# Patient Record
Sex: Male | Born: 1960 | Race: White | Hispanic: No | Marital: Married | Smoking: Former smoker
Health system: Southern US, Community
[De-identification: ages and names within clinical notes are randomized; demographics above are authoritative.]

## PROBLEM LIST (undated history)

## (undated) DIAGNOSIS — N529 Male erectile dysfunction, unspecified: Secondary | ICD-10-CM

## (undated) DIAGNOSIS — K219 Gastro-esophageal reflux disease without esophagitis: Secondary | ICD-10-CM

## (undated) DIAGNOSIS — G2581 Restless legs syndrome: Secondary | ICD-10-CM

## (undated) DIAGNOSIS — E785 Hyperlipidemia, unspecified: Secondary | ICD-10-CM

## (undated) DIAGNOSIS — I739 Peripheral vascular disease, unspecified: Secondary | ICD-10-CM

## (undated) DIAGNOSIS — C801 Malignant (primary) neoplasm, unspecified: Secondary | ICD-10-CM

## (undated) DIAGNOSIS — G4730 Sleep apnea, unspecified: Secondary | ICD-10-CM

## (undated) DIAGNOSIS — I6529 Occlusion and stenosis of unspecified carotid artery: Secondary | ICD-10-CM

## (undated) DIAGNOSIS — I10 Essential (primary) hypertension: Secondary | ICD-10-CM

## (undated) DIAGNOSIS — N189 Chronic kidney disease, unspecified: Secondary | ICD-10-CM

## (undated) DIAGNOSIS — G4700 Insomnia, unspecified: Secondary | ICD-10-CM

## (undated) DIAGNOSIS — M4712 Other spondylosis with myelopathy, cervical region: Secondary | ICD-10-CM

## (undated) HISTORY — DX: Essential (primary) hypertension: I10

## (undated) HISTORY — PX: APPENDECTOMY: SHX54

## (undated) HISTORY — DX: Hyperlipidemia, unspecified: E78.5

## (undated) HISTORY — DX: Insomnia, unspecified: G47.00

## (undated) HISTORY — DX: Other spondylosis with myelopathy, cervical region: M47.12

## (undated) HISTORY — DX: Occlusion and stenosis of unspecified carotid artery: I65.29

## (undated) HISTORY — DX: Restless legs syndrome: G25.81

## (undated) HISTORY — DX: Male erectile dysfunction, unspecified: N52.9

## (undated) HISTORY — DX: Peripheral vascular disease, unspecified: I73.9

## (undated) HISTORY — DX: Malignant (primary) neoplasm, unspecified: C80.1

---

## 1998-09-12 HISTORY — PX: SPINE SURGERY: SHX786

## 1998-09-12 HISTORY — PX: OTHER SURGICAL HISTORY: SHX169

## 1998-10-14 ENCOUNTER — Encounter: Payer: Self-pay | Admitting: Neurosurgery

## 1998-10-16 ENCOUNTER — Encounter: Payer: Self-pay | Admitting: Neurosurgery

## 1998-10-16 ENCOUNTER — Inpatient Hospital Stay (HOSPITAL_COMMUNITY): Admission: RE | Admit: 1998-10-16 | Discharge: 1998-10-16 | Payer: Self-pay | Admitting: Neurosurgery

## 1999-12-07 ENCOUNTER — Encounter: Payer: Self-pay | Admitting: Neurosurgery

## 1999-12-07 ENCOUNTER — Encounter: Admission: RE | Admit: 1999-12-07 | Discharge: 1999-12-07 | Payer: Self-pay | Admitting: Neurosurgery

## 2011-11-07 DIAGNOSIS — H5210 Myopia, unspecified eye: Secondary | ICD-10-CM | POA: Diagnosis not present

## 2011-11-07 DIAGNOSIS — H52229 Regular astigmatism, unspecified eye: Secondary | ICD-10-CM | POA: Diagnosis not present

## 2011-11-07 DIAGNOSIS — H524 Presbyopia: Secondary | ICD-10-CM | POA: Diagnosis not present

## 2011-12-23 DIAGNOSIS — H2510 Age-related nuclear cataract, unspecified eye: Secondary | ICD-10-CM | POA: Diagnosis not present

## 2012-02-06 DIAGNOSIS — H269 Unspecified cataract: Secondary | ICD-10-CM | POA: Diagnosis not present

## 2012-02-06 DIAGNOSIS — H2510 Age-related nuclear cataract, unspecified eye: Secondary | ICD-10-CM | POA: Diagnosis not present

## 2012-02-07 DIAGNOSIS — I10 Essential (primary) hypertension: Secondary | ICD-10-CM | POA: Diagnosis not present

## 2012-02-07 DIAGNOSIS — E119 Type 2 diabetes mellitus without complications: Secondary | ICD-10-CM | POA: Diagnosis not present

## 2012-02-07 DIAGNOSIS — E782 Mixed hyperlipidemia: Secondary | ICD-10-CM | POA: Diagnosis not present

## 2012-02-27 DIAGNOSIS — F17200 Nicotine dependence, unspecified, uncomplicated: Secondary | ICD-10-CM | POA: Diagnosis not present

## 2012-02-27 DIAGNOSIS — M4712 Other spondylosis with myelopathy, cervical region: Secondary | ICD-10-CM | POA: Diagnosis not present

## 2012-02-27 DIAGNOSIS — G4700 Insomnia, unspecified: Secondary | ICD-10-CM | POA: Diagnosis not present

## 2012-02-27 DIAGNOSIS — N529 Male erectile dysfunction, unspecified: Secondary | ICD-10-CM | POA: Diagnosis not present

## 2012-02-27 DIAGNOSIS — G2581 Restless legs syndrome: Secondary | ICD-10-CM | POA: Diagnosis not present

## 2012-02-27 DIAGNOSIS — I10 Essential (primary) hypertension: Secondary | ICD-10-CM | POA: Diagnosis not present

## 2012-02-27 DIAGNOSIS — E119 Type 2 diabetes mellitus without complications: Secondary | ICD-10-CM | POA: Diagnosis not present

## 2012-02-27 DIAGNOSIS — E782 Mixed hyperlipidemia: Secondary | ICD-10-CM | POA: Diagnosis not present

## 2012-03-02 DIAGNOSIS — I70219 Atherosclerosis of native arteries of extremities with intermittent claudication, unspecified extremity: Secondary | ICD-10-CM | POA: Diagnosis not present

## 2012-03-02 DIAGNOSIS — M79609 Pain in unspecified limb: Secondary | ICD-10-CM | POA: Diagnosis not present

## 2012-03-12 ENCOUNTER — Other Ambulatory Visit: Payer: Self-pay

## 2012-03-12 DIAGNOSIS — I739 Peripheral vascular disease, unspecified: Secondary | ICD-10-CM

## 2012-04-12 ENCOUNTER — Encounter: Payer: Self-pay | Admitting: Surgery

## 2012-04-20 ENCOUNTER — Encounter: Payer: Self-pay | Admitting: Surgery

## 2012-04-23 ENCOUNTER — Encounter: Payer: Self-pay | Admitting: Surgery

## 2012-04-23 ENCOUNTER — Ambulatory Visit (INDEPENDENT_AMBULATORY_CARE_PROVIDER_SITE_OTHER): Payer: Medicare Other | Admitting: Surgery

## 2012-04-23 ENCOUNTER — Encounter (INDEPENDENT_AMBULATORY_CARE_PROVIDER_SITE_OTHER): Payer: Medicare Other | Admitting: *Deleted

## 2012-04-23 VITALS — BP 177/85 | HR 81 | Resp 16 | Ht 73.0 in | Wt 178.0 lb

## 2012-04-23 DIAGNOSIS — I70219 Atherosclerosis of native arteries of extremities with intermittent claudication, unspecified extremity: Secondary | ICD-10-CM | POA: Diagnosis not present

## 2012-04-23 DIAGNOSIS — I739 Peripheral vascular disease, unspecified: Secondary | ICD-10-CM | POA: Diagnosis not present

## 2012-04-23 MED ORDER — CILOSTAZOL 100 MG PO TABS: 100.0000 mg | ORAL_TABLET | Freq: Two times a day (BID) | ORAL | Status: DC

## 2012-04-23 NOTE — Progress Notes (Signed)
[facility] [place]   Patient name: Raymond [name] MRN: [id number] DOB: Jul 23, 1961 Sex: male   Referred by: Dr. Pleas Koch  Reason for referral:  Chief Complaint  Patient presents with  . PVD    ref by Dr. Judd Lien    HISTORY OF PRESENT ILLNESS: The patient comes in today for evaluation of bilateral leg pain. He states that he has been having problems for over a year but this has been getting worse. He states that with working in his garden he has his most difficulty. He feels that he can walk on flat surfaces but whenever he tries to go up a hill he suffers from quick onset cramping more prominent in his right leg. However, his left leg has becoming increasingly more severe. He denies ulceration. He denies rest pain.  The patient does have a history of hyperlipidemia which is being managed with diet. He has also had the diagnosis of diabetes but is not yet requiring medication. He is medically managed for his hypertension. His systolic blood pressures have been in the 130s. The patient does have a history of smoking and continues to smoke.  Past Medical History  Diagnosis Date  . Hypertension   . Hyperlipidemia   . Insomnia   . Diabetes mellitus     Type II  . Restless leg syndrome   . Impotence of organic origin   . Cervical spondylosis with myelopathy     Arthritis  . Peripheral arterial disease   . Cancer     melanoma    Past Surgical History  Procedure Date  . Spine surgery 2000    Cervical Myelopathy- S/P fusion  . Spinal fusion 2000    History   Social History  . Marital Status: Married    Spouse Name: N/A    Number of Children: N/A  . Years of Education: N/A   Occupational History  . Not on file.   Social History Main Topics  . Smoking status: Current Everyday Smoker -- 1.0 packs/day    Types: Cigarettes  . Smokeless tobacco: Never Used  . Alcohol Use: 3.6 oz/week    6 Cans of beer per week  . Drug Use: No  . Sexually  Active: Not on file   Other Topics Concern  . Not on file   Social History Narrative  . No narrative on file    Family History  Problem Relation Age of Onset  . Cancer Mother   . Hypertension Father   . Hypertension Sister     Allergies as of 04/23/2012  . (No Known Allergies)    Current Outpatient Prescriptions on File Prior to Visit  Medication Sig Dispense Refill  . acetaminophen (TYLENOL) 100 MG/ML solution Take 10 mg/kg by mouth every 4 (four) hours as needed.      Marland Kitchen amLODipine (NORVASC) 10 MG tablet Take 10 mg by mouth daily.      Marland Kitchen aspirin 81 MG tablet Take 81 mg by mouth daily.      Marland Kitchen lisinopril (PRINIVIL,ZESTRIL) 40 MG tablet Take 40 mg by mouth 2 (two) times daily.       . traZODone (DESYREL) 100 MG tablet Take 100 mg by mouth at bedtime.         REVIEW OF SYSTEMS: Cardiovascular: No chest pain, chest pressure, palpitations, orthopnea, or dyspnea on exertion. Positive for bilateral claudication  No history of DVT or phlebitis. Pulmonary: No productive cough, asthma or wheezing. Neurologic: No weakness, paresthesias, aphasia, or  amaurosis. No dizziness. Hematologic: No bleeding problems or clotting disorders. Musculoskeletal: No joint pain or joint swelling. Gastrointestinal: No blood in stool or hematemesis Genitourinary: No dysuria or hematuria. Psychiatric:: No history of major depression. Integumentary: No rashes or ulcers. Constitutional: No fever or chills.  PHYSICAL EXAMINATION: General: The patient appears their stated age.  Vital signs are BP 177/85  Pulse 81  Resp 16  Ht 6\' 1"  (1.854 m)  Wt 178 lb (80.74 kg)  BMI 23.48 kg/m2  SpO2 99% HEENT:  No gross abnormalities Pulmonary: Respirations are non-labored Abdomen: Soft and non-tender  Musculoskeletal: There are no major deformities.   Neurologic: No focal weakness or paresthesias are detected, Skin: There are no ulcer or rashes noted. Psychiatric: The patient has normal  affect. Cardiovascular: There is a regular rate and rhythm without significant murmur appreciated. Pedal pulses are not palpable. No carotid bruits.  Diagnostic Studies: Duplex ultrasound ordered and reviewed today shows bilateral SFA occlusion. Outside ABIs are 0.34 on the right and 0.5 on the left.  Outside Studies/Documentation Historical records were reviewed.  They showed peripheral vascular disease, right greater than left  Medication Changes: Cilostazol 100 mg twice a day was added  Assessment:  Bilateral claudication, right greater than left Plan: Today I discussed the natural history of peripheral vascular disease. We discussed all treatment options including percutaneous, open surgical, and medical. Because of the patient's age I would like to reserve interventions for his symptoms become more severe. I have outlined an exercise program for him today. I have also stressed the importance of smoking cessation. I would also like him to be on a statin if his cholesterol profile warrents this. I have given him a prescription for cilostazol to see if this will improve his walking distance. He is scheduled to come back to see me in 3 months. At that time we will make the determination as to whether or not we need to proceed with additional interventions.     Eldridge Abrahams, M.D. [facility] [place] Office: [phone] Pager:  [phone]

## 2012-05-07 ENCOUNTER — Telehealth: Payer: Self-pay | Admitting: *Deleted

## 2012-05-07 NOTE — Telephone Encounter (Signed)
Patient called in re: slight dizziness when taking the BID Pletal that he started last week. I called him back and left a message that he could try and split the dosage; Take 1 in the morning and 1 in the evening, Take 30 min prior to eating or 2 hours after eating as per drug dosing guidelines. I told him to call me back if he had any more problems with this.

## 2012-05-21 DIAGNOSIS — E119 Type 2 diabetes mellitus without complications: Secondary | ICD-10-CM | POA: Diagnosis not present

## 2012-05-21 DIAGNOSIS — F17200 Nicotine dependence, unspecified, uncomplicated: Secondary | ICD-10-CM | POA: Diagnosis not present

## 2012-05-21 DIAGNOSIS — I10 Essential (primary) hypertension: Secondary | ICD-10-CM | POA: Diagnosis not present

## 2012-05-21 DIAGNOSIS — E782 Mixed hyperlipidemia: Secondary | ICD-10-CM | POA: Diagnosis not present

## 2012-05-28 DIAGNOSIS — E782 Mixed hyperlipidemia: Secondary | ICD-10-CM | POA: Diagnosis not present

## 2012-05-28 DIAGNOSIS — I10 Essential (primary) hypertension: Secondary | ICD-10-CM | POA: Diagnosis not present

## 2012-05-28 DIAGNOSIS — F17200 Nicotine dependence, unspecified, uncomplicated: Secondary | ICD-10-CM | POA: Diagnosis not present

## 2012-05-28 DIAGNOSIS — G4700 Insomnia, unspecified: Secondary | ICD-10-CM | POA: Diagnosis not present

## 2012-05-28 DIAGNOSIS — E119 Type 2 diabetes mellitus without complications: Secondary | ICD-10-CM | POA: Diagnosis not present

## 2012-05-28 DIAGNOSIS — I739 Peripheral vascular disease, unspecified: Secondary | ICD-10-CM | POA: Diagnosis not present

## 2012-07-20 ENCOUNTER — Encounter: Payer: Self-pay | Admitting: Surgery

## 2012-07-23 ENCOUNTER — Encounter: Payer: Self-pay | Admitting: Surgery

## 2012-07-23 ENCOUNTER — Ambulatory Visit (INDEPENDENT_AMBULATORY_CARE_PROVIDER_SITE_OTHER): Payer: Medicare Other | Admitting: Surgery

## 2012-07-23 VITALS — BP 186/78 | HR 90 | Ht 73.0 in | Wt 179.5 lb

## 2012-07-23 DIAGNOSIS — I70219 Atherosclerosis of native arteries of extremities with intermittent claudication, unspecified extremity: Secondary | ICD-10-CM

## 2012-07-23 NOTE — Progress Notes (Signed)
[facility]    Patient name: Raymond [name] MRN: [id number] DOB: 1961/01/25 Sex: male     Chief Complaint  Patient presents with  . PVD    3 month f/u pt states that he is able to walk further before quitting the cilostazol 3 weeks ago due to it making him dizzy    HISTORY OF PRESENT ILLNESS: The patient is back today for evaluation of bilateral claudication. The patient by ultrasound has bilateral superficial femoral artery occlusion. After our first visit we elected to manage this problem medically. He was given cilostazol 100 mg twice daily. He states that this has caused a significant improvement in his symptoms. However recently he began experiencing dizziness at night and attributed this to the medicine and therefore stopped that. He states that he was taking 2 tablets at night instead of taking one the morning and one at night. His dizziness also coincided with taking his blood pressure medicine. After the medicine he is able to work in his garden as well as walk around the house, something he was not able to do prior to starting cilostazol.  Past Medical History  Diagnosis Date  . Hypertension   . Hyperlipidemia   . Insomnia   . Diabetes mellitus     Type II  . Restless leg syndrome   . Impotence of organic origin   . Cervical spondylosis with myelopathy     Arthritis  . Peripheral arterial disease   . Cancer     melanoma    Past Surgical History  Procedure Date  . Spine surgery 2000    Cervical Myelopathy- S/P fusion  . Spinal fusion 2000    History   Social History  . Marital Status: Married    Spouse Name: N/A    Number of Children: N/A  . Years of Education: N/A   Occupational History  . Not on file.   Social History Main Topics  . Smoking status: Current Every Day Smoker -- 1.0 packs/day    Types: Cigarettes  . Smokeless tobacco: Never Used  . Alcohol Use: 3.6 oz/week    6 Cans of beer per week  . Drug Use: No  .  Sexually Active: Not on file   Other Topics Concern  . Not on file   Social History Narrative  . No narrative on file    Family History  Problem Relation Age of Onset  . Cancer Mother   . Hypertension Father   . Hypertension Sister     Allergies as of 07/23/2012  . (No Known Allergies)    Current Outpatient Prescriptions on File Prior to Visit  Medication Sig Dispense Refill  . acetaminophen (TYLENOL) 100 MG/ML solution Take 10 mg/kg by mouth every 4 (four) hours as needed.      Marland Kitchen amLODipine (NORVASC) 10 MG tablet Take 10 mg by mouth daily.      Marland Kitchen aspirin 81 MG tablet Take 81 mg by mouth daily.      Marland Kitchen atorvastatin (LIPITOR) 10 MG tablet Take 10 mg by mouth daily.      Marland Kitchen lisinopril (PRINIVIL,ZESTRIL) 40 MG tablet Take 40 mg by mouth 2 (two) times daily.       . traZODone (DESYREL) 100 MG tablet Take 100 mg by mouth at bedtime.      . cilostazol (PLETAL) 100 MG tablet Take 1 tablet (100 mg total) by mouth 2 (two) times daily before a meal.  60 tablet  11  REVIEW OF SYSTEMS: We see history of present illness, otherwise no significant changes since last visit.  PHYSICAL EXAMINATION:   Vital signs are BP 186/78  Pulse 90  Ht 6\' 1"  (1.854 m)  Wt 179 lb 8 oz (81.421 kg)  BMI 23.68 kg/m2  SpO2 100% General: The patient appears their stated age. HEENT:  No gross abnormalities Pulmonary:  Non labored breathing Musculoskeletal: There are no major deformities. Neurologic: No focal weakness or paresthesias are detected, Skin: There are no ulcer or rashes noted. Psychiatric: The patient has normal affect. Cardiovascular: There is a regular rate and rhythm without significant murmur appreciated. Pedal pulses are not palpable. No carotid bruits.   Diagnostic Studies None  Assessment: Peripheral vascular disease with bilateral claudication Plan: The patient did have a significant improvement in his symptoms after starting cilostazol. Unfortunately he developed some  dizziness at night after taking the medicine. Upon questioning him, I discovered that he was taking 2 tablets at night instead of one in the morning and one at night. I suspect this may have something to do with his dizziness. I have told him to take 100 mg in the morning and 100 mg at night to see if this helps with his dizziness. In addition, it is also possible that his dizziness is do to his blood pressure medication. If he continues to have dizziness only at night it is likely related to his blood pressure medicine. If it is throughout the day, outer recommend cutting back the cilostazol to 50 mg twice daily. If he still has a problem, I would discontinue it and we can evaluate his symptoms at that time. The patient has been given an appointment with me in 6 months. He will call sooner should he develop problems.  Eldridge Abrahams, M.D. [facility] [place] Office: [phone] Pager:  [phone]

## 2012-12-03 DIAGNOSIS — E782 Mixed hyperlipidemia: Secondary | ICD-10-CM | POA: Diagnosis not present

## 2012-12-03 DIAGNOSIS — I10 Essential (primary) hypertension: Secondary | ICD-10-CM | POA: Diagnosis not present

## 2012-12-10 DIAGNOSIS — E782 Mixed hyperlipidemia: Secondary | ICD-10-CM | POA: Diagnosis not present

## 2012-12-10 DIAGNOSIS — G4700 Insomnia, unspecified: Secondary | ICD-10-CM | POA: Diagnosis not present

## 2012-12-10 DIAGNOSIS — E119 Type 2 diabetes mellitus without complications: Secondary | ICD-10-CM | POA: Diagnosis not present

## 2012-12-10 DIAGNOSIS — I739 Peripheral vascular disease, unspecified: Secondary | ICD-10-CM | POA: Diagnosis not present

## 2012-12-10 DIAGNOSIS — I10 Essential (primary) hypertension: Secondary | ICD-10-CM | POA: Diagnosis not present

## 2012-12-10 DIAGNOSIS — F17200 Nicotine dependence, unspecified, uncomplicated: Secondary | ICD-10-CM | POA: Diagnosis not present

## 2012-12-10 DIAGNOSIS — Z0000 Encounter for general adult medical examination without abnormal findings: Secondary | ICD-10-CM | POA: Diagnosis not present

## 2013-01-18 ENCOUNTER — Encounter: Payer: Self-pay | Admitting: Surgery

## 2013-01-21 ENCOUNTER — Encounter: Payer: Self-pay | Admitting: Surgery

## 2013-01-21 ENCOUNTER — Ambulatory Visit (INDEPENDENT_AMBULATORY_CARE_PROVIDER_SITE_OTHER): Payer: Medicare Other | Admitting: Surgery

## 2013-01-21 VITALS — BP 158/80 | HR 85 | Ht 73.0 in | Wt 182.8 lb

## 2013-01-21 DIAGNOSIS — I70219 Atherosclerosis of native arteries of extremities with intermittent claudication, unspecified extremity: Secondary | ICD-10-CM

## 2013-01-21 NOTE — Progress Notes (Signed)
[facility] [place]   Patient name: Raymond [name] MRN: [id number] DOB: [birth year] Sex: male     Chief Complaint  Patient presents with  . Re-evaluation    6 month f/u pt states that the suspected pletal sx of dizziness has subsided since he stopped drinking coffee 5-6 wk ago also c/o heaviness in his legs at times    HISTORY OF PRESENT ILLNESS: The patient is back today for followup of his bilateral claudication. He had a good response to cilostazol, however was not taking the medication as directed, therefore he was having some side effects of dizziness. Once we got him on the appropriate dosing, he has done very well. He does note that he has trouble when he goes up hill or is pushing heavy objects, otherwise he is able to tolerate his symptoms. He denies nonhealing wounds. He continues to smoke but is cut back to a half a pack a day. He continues to be medically managed for his hypercholesterolemia with a statin. His blood pressure is managed with a ACE inhibitor.  Past Medical History  Diagnosis Date  . Hypertension   . Hyperlipidemia   . Insomnia   . Diabetes mellitus     Type II  . Restless leg syndrome   . Impotence of organic origin   . Cervical spondylosis with myelopathy     Arthritis  . Peripheral arterial disease   . Cancer     melanoma    Past Surgical History  Procedure Laterality Date  . Spine surgery  2000    Cervical Myelopathy- S/P fusion  . Spinal fusion  2000    History   Social History  . Marital Status: Married    Spouse Name: N/A    Number of Children: N/A  . Years of Education: N/A   Occupational History  . Not on file.   Social History Main Topics  . Smoking status: Current Every Day Smoker -- 0.50 packs/day    Types: Cigarettes  . Smokeless tobacco: Never Used  . Alcohol Use: 3.6 oz/week    6 Cans of beer per week  . Drug Use: No  . Sexually Active: Not on file   Other Topics Concern  . Not on file   Social  History Narrative  . No narrative on file    Family History  Problem Relation Age of Onset  . Cancer Mother   . Hypertension Father   . Hypertension Sister     Allergies as of 01/21/2013  . (No Known Allergies)    Current Outpatient Prescriptions on File Prior to Visit  Medication Sig Dispense Refill  . acetaminophen (TYLENOL) 100 MG/ML solution Take 10 mg/kg by mouth every 4 (four) hours as needed.      Marland Kitchen amLODipine (NORVASC) 10 MG tablet Take 10 mg by mouth daily.      Marland Kitchen aspirin 81 MG tablet Take 81 mg by mouth daily.      Marland Kitchen atorvastatin (LIPITOR) 10 MG tablet Take 10 mg by mouth daily.      . cilostazol (PLETAL) 100 MG tablet Take 1 tablet (100 mg total) by mouth 2 (two) times daily before a meal.  60 tablet  11  . lisinopril (PRINIVIL,ZESTRIL) 40 MG tablet Take 40 mg by mouth 2 (two) times daily.       . traZODone (DESYREL) 100 MG tablet Take 100 mg by mouth at bedtime.       No current facility-administered medications on file prior  to visit.     REVIEW OF SYSTEMS: Please see history of present illness, otherwise all systems negative  PHYSICAL EXAMINATION:   Vital signs are BP 158/80  Pulse 85  Ht 6\' 1"  (1.854 m)  Wt 182 lb 12.8 oz (82.918 kg)  BMI 24.12 kg/m2  SpO2 100% General: The patient appears their stated age. HEENT:  No gross abnormalities Pulmonary:  Non labored breathing Abdomen: Soft and non-tender. No pulsatile mass Musculoskeletal: There are no major deformities. Neurologic: No focal weakness or paresthesias are detected, Skin: There are no ulcer or rashes noted. Psychiatric: The patient has normal affect. Cardiovascular: There is a regular rate and rhythm without significant murmur appreciated. No carotid bruits. Pedal pulses are not palpable.   Diagnostic Studies None  Assessment: Bilateral claudication, right greater than left Plan: The patient is doing well with cilostazol. I discussed the need for smoking cessation. I also discussed the  importance of daily monitoring of his feet for nonhealing wounds. He is scheduled to come back in one year for followup with ABIs  V. Leia Alf, M.D. [facility] [place] Office: [phone] Pager:  [phone]

## 2013-01-21 NOTE — Addendum Note (Signed)
Addended by: Mena Goes on: 01/21/2013 12:33 PM   Modules accepted: Orders

## 2013-06-04 DIAGNOSIS — I10 Essential (primary) hypertension: Secondary | ICD-10-CM | POA: Diagnosis not present

## 2013-06-04 DIAGNOSIS — E782 Mixed hyperlipidemia: Secondary | ICD-10-CM | POA: Diagnosis not present

## 2013-06-04 DIAGNOSIS — E119 Type 2 diabetes mellitus without complications: Secondary | ICD-10-CM | POA: Diagnosis not present

## 2013-06-04 DIAGNOSIS — F17200 Nicotine dependence, unspecified, uncomplicated: Secondary | ICD-10-CM | POA: Diagnosis not present

## 2013-06-11 DIAGNOSIS — G4700 Insomnia, unspecified: Secondary | ICD-10-CM | POA: Diagnosis not present

## 2013-06-11 DIAGNOSIS — I739 Peripheral vascular disease, unspecified: Secondary | ICD-10-CM | POA: Diagnosis not present

## 2013-06-11 DIAGNOSIS — Z23 Encounter for immunization: Secondary | ICD-10-CM | POA: Diagnosis not present

## 2013-06-11 DIAGNOSIS — F17200 Nicotine dependence, unspecified, uncomplicated: Secondary | ICD-10-CM | POA: Diagnosis not present

## 2013-06-11 DIAGNOSIS — E782 Mixed hyperlipidemia: Secondary | ICD-10-CM | POA: Diagnosis not present

## 2013-06-11 DIAGNOSIS — I10 Essential (primary) hypertension: Secondary | ICD-10-CM | POA: Diagnosis not present

## 2013-09-10 ENCOUNTER — Other Ambulatory Visit: Payer: Self-pay | Admitting: *Deleted

## 2013-09-10 DIAGNOSIS — I70219 Atherosclerosis of native arteries of extremities with intermittent claudication, unspecified extremity: Secondary | ICD-10-CM

## 2013-09-10 MED ORDER — CILOSTAZOL 100 MG PO TABS: 100.0000 mg | ORAL_TABLET | Freq: Two times a day (BID) | ORAL | Status: DC

## 2013-12-04 DIAGNOSIS — Z0000 Encounter for general adult medical examination without abnormal findings: Secondary | ICD-10-CM | POA: Diagnosis not present

## 2013-12-04 DIAGNOSIS — E782 Mixed hyperlipidemia: Secondary | ICD-10-CM | POA: Diagnosis not present

## 2014-01-27 ENCOUNTER — Encounter (HOSPITAL_COMMUNITY): Payer: Medicare Other

## 2014-01-27 ENCOUNTER — Ambulatory Visit: Payer: Medicare Other | Admitting: Family

## 2014-09-24 ENCOUNTER — Other Ambulatory Visit: Payer: Self-pay | Admitting: *Deleted

## 2014-09-24 DIAGNOSIS — I70219 Atherosclerosis of native arteries of extremities with intermittent claudication, unspecified extremity: Secondary | ICD-10-CM

## 2014-09-24 MED ORDER — CILOSTAZOL 100 MG PO TABS: 100.0000 mg | ORAL_TABLET | Freq: Two times a day (BID) | ORAL | Status: DC

## 2014-10-07 DIAGNOSIS — H538 Other visual disturbances: Secondary | ICD-10-CM | POA: Diagnosis not present

## 2014-10-07 DIAGNOSIS — H5211 Myopia, right eye: Secondary | ICD-10-CM | POA: Diagnosis not present

## 2014-10-16 DIAGNOSIS — H2511 Age-related nuclear cataract, right eye: Secondary | ICD-10-CM | POA: Diagnosis not present

## 2014-10-16 DIAGNOSIS — H25011 Cortical age-related cataract, right eye: Secondary | ICD-10-CM | POA: Diagnosis not present

## 2014-10-16 DIAGNOSIS — H538 Other visual disturbances: Secondary | ICD-10-CM | POA: Diagnosis not present

## 2014-10-20 DIAGNOSIS — I739 Peripheral vascular disease, unspecified: Secondary | ICD-10-CM | POA: Diagnosis not present

## 2014-10-20 DIAGNOSIS — J449 Chronic obstructive pulmonary disease, unspecified: Secondary | ICD-10-CM | POA: Diagnosis not present

## 2014-10-20 DIAGNOSIS — H269 Unspecified cataract: Secondary | ICD-10-CM | POA: Diagnosis not present

## 2014-10-20 DIAGNOSIS — H5200 Hypermetropia, unspecified eye: Secondary | ICD-10-CM | POA: Diagnosis not present

## 2014-10-20 DIAGNOSIS — Z961 Presence of intraocular lens: Secondary | ICD-10-CM | POA: Diagnosis not present

## 2014-10-20 DIAGNOSIS — Z79899 Other long term (current) drug therapy: Secondary | ICD-10-CM | POA: Diagnosis not present

## 2014-10-20 DIAGNOSIS — H2511 Age-related nuclear cataract, right eye: Secondary | ICD-10-CM | POA: Diagnosis not present

## 2014-10-20 DIAGNOSIS — H538 Other visual disturbances: Secondary | ICD-10-CM | POA: Diagnosis not present

## 2014-10-20 DIAGNOSIS — H524 Presbyopia: Secondary | ICD-10-CM | POA: Diagnosis not present

## 2014-10-20 DIAGNOSIS — H5210 Myopia, unspecified eye: Secondary | ICD-10-CM | POA: Diagnosis not present

## 2014-10-20 DIAGNOSIS — I10 Essential (primary) hypertension: Secondary | ICD-10-CM | POA: Diagnosis not present

## 2014-10-20 DIAGNOSIS — H52209 Unspecified astigmatism, unspecified eye: Secondary | ICD-10-CM | POA: Diagnosis not present

## 2014-10-20 DIAGNOSIS — Z7982 Long term (current) use of aspirin: Secondary | ICD-10-CM | POA: Diagnosis not present

## 2014-10-20 DIAGNOSIS — G4700 Insomnia, unspecified: Secondary | ICD-10-CM | POA: Diagnosis not present

## 2014-10-20 DIAGNOSIS — H25011 Cortical age-related cataract, right eye: Secondary | ICD-10-CM | POA: Diagnosis not present

## 2015-09-13 HISTORY — PX: EYE SURGERY: SHX253

## 2015-10-02 ENCOUNTER — Other Ambulatory Visit: Payer: Self-pay | Admitting: Surgery

## 2015-10-07 ENCOUNTER — Other Ambulatory Visit: Payer: Self-pay | Admitting: Surgery

## 2016-03-17 DIAGNOSIS — Z8673 Personal history of transient ischemic attack (TIA), and cerebral infarction without residual deficits: Secondary | ICD-10-CM | POA: Diagnosis not present

## 2016-03-17 DIAGNOSIS — M25512 Pain in left shoulder: Secondary | ICD-10-CM | POA: Diagnosis not present

## 2016-03-17 DIAGNOSIS — F17200 Nicotine dependence, unspecified, uncomplicated: Secondary | ICD-10-CM | POA: Diagnosis not present

## 2016-03-17 DIAGNOSIS — R51 Headache: Secondary | ICD-10-CM | POA: Diagnosis not present

## 2016-03-17 DIAGNOSIS — M542 Cervicalgia: Secondary | ICD-10-CM | POA: Diagnosis not present

## 2016-03-17 DIAGNOSIS — S0990XA Unspecified injury of head, initial encounter: Secondary | ICD-10-CM | POA: Diagnosis not present

## 2016-03-17 DIAGNOSIS — S4992XA Unspecified injury of left shoulder and upper arm, initial encounter: Secondary | ICD-10-CM | POA: Diagnosis not present

## 2016-03-17 DIAGNOSIS — Z79899 Other long term (current) drug therapy: Secondary | ICD-10-CM | POA: Diagnosis not present

## 2016-03-17 DIAGNOSIS — I10 Essential (primary) hypertension: Secondary | ICD-10-CM | POA: Diagnosis not present

## 2016-03-17 DIAGNOSIS — S199XXA Unspecified injury of neck, initial encounter: Secondary | ICD-10-CM | POA: Diagnosis not present

## 2016-04-11 ENCOUNTER — Other Ambulatory Visit: Payer: Self-pay | Admitting: Surgery

## 2016-05-16 ENCOUNTER — Other Ambulatory Visit: Payer: Self-pay | Admitting: Surgery

## 2016-06-21 ENCOUNTER — Telehealth: Payer: Self-pay | Admitting: Surgery

## 2016-06-21 NOTE — Telephone Encounter (Signed)
Patient called and says he needs a refill of his Plavix.  Pt aware message will be answered on 06/22/16.  Oswaldo Cueto E., LPN.

## 2016-06-22 ENCOUNTER — Other Ambulatory Visit: Payer: Self-pay | Admitting: Surgery

## 2016-06-22 ENCOUNTER — Other Ambulatory Visit: Payer: Self-pay

## 2016-06-22 DIAGNOSIS — I70219 Atherosclerosis of native arteries of extremities with intermittent claudication, unspecified extremity: Secondary | ICD-10-CM

## 2016-06-22 MED ORDER — CILOSTAZOL 100 MG PO TABS: 100.0000 mg | ORAL_TABLET | Freq: Two times a day (BID) | ORAL | 1 refills | Status: DC

## 2016-08-09 ENCOUNTER — Other Ambulatory Visit: Payer: Self-pay | Admitting: *Deleted

## 2016-08-09 DIAGNOSIS — I70219 Atherosclerosis of native arteries of extremities with intermittent claudication, unspecified extremity: Secondary | ICD-10-CM

## 2016-08-09 MED ORDER — CILOSTAZOL 100 MG PO TABS: 100.0000 mg | ORAL_TABLET | Freq: Two times a day (BID) | ORAL | 5 refills | Status: DC

## 2016-08-10 ENCOUNTER — Ambulatory Visit: Payer: Medicare Other | Admitting: Family

## 2016-08-10 ENCOUNTER — Encounter (HOSPITAL_COMMUNITY): Payer: Medicare Other

## 2016-08-15 ENCOUNTER — Ambulatory Visit: Payer: Medicare Other | Admitting: Family

## 2016-08-15 ENCOUNTER — Encounter (HOSPITAL_COMMUNITY): Payer: Medicare Other

## 2016-10-04 ENCOUNTER — Encounter: Payer: Self-pay | Admitting: Family

## 2016-10-05 ENCOUNTER — Other Ambulatory Visit: Payer: Self-pay

## 2016-10-05 DIAGNOSIS — I739 Peripheral vascular disease, unspecified: Secondary | ICD-10-CM

## 2016-10-10 ENCOUNTER — Ambulatory Visit (INDEPENDENT_AMBULATORY_CARE_PROVIDER_SITE_OTHER): Payer: Medicare Other | Admitting: Family

## 2016-10-10 ENCOUNTER — Encounter: Payer: Self-pay | Admitting: Family

## 2016-10-10 ENCOUNTER — Ambulatory Visit (HOSPITAL_COMMUNITY)
Admission: RE | Admit: 2016-10-10 | Discharge: 2016-10-10 | Disposition: A | Discharge status: Home / Self Care | Payer: Medicare Other | Source: Ambulatory Visit | Attending: Family | Admitting: Family

## 2016-10-10 VITALS — BP 126/72 | HR 100 | Temp 97.2°F | Resp 18 | Ht 73.0 in | Wt 175.0 lb

## 2016-10-10 DIAGNOSIS — R938 Abnormal findings on diagnostic imaging of other specified body structures: Secondary | ICD-10-CM | POA: Insufficient documentation

## 2016-10-10 DIAGNOSIS — I70213 Atherosclerosis of native arteries of extremities with intermittent claudication, bilateral legs: Secondary | ICD-10-CM

## 2016-10-10 DIAGNOSIS — E785 Hyperlipidemia, unspecified: Secondary | ICD-10-CM | POA: Insufficient documentation

## 2016-10-10 DIAGNOSIS — I739 Peripheral vascular disease, unspecified: Secondary | ICD-10-CM | POA: Insufficient documentation

## 2016-10-10 DIAGNOSIS — I10 Essential (primary) hypertension: Secondary | ICD-10-CM | POA: Diagnosis not present

## 2016-10-10 DIAGNOSIS — R0989 Other specified symptoms and signs involving the circulatory and respiratory systems: Secondary | ICD-10-CM | POA: Diagnosis present

## 2016-10-10 DIAGNOSIS — F17200 Nicotine dependence, unspecified, uncomplicated: Secondary | ICD-10-CM | POA: Diagnosis not present

## 2016-10-10 DIAGNOSIS — Z720 Tobacco use: Secondary | ICD-10-CM | POA: Diagnosis not present

## 2016-10-10 NOTE — Patient Instructions (Signed)
Intermittent Claudication Intermittent claudication is pain in your leg that occurs when you walk or exercise and goes away when you rest. The pain can occur in one or both legs. CAUSES Intermittent claudication is caused by the buildup of plaque within the major arteries in the body (atherosclerosis). The plaque, which makes arteries stiff and narrow, prevents enough blood from reaching your leg muscles. The pain occurs when you walk or exercise because your muscles need more blood when you are moving and exercising. RISK FACTORS Risk factors include:  A family history of atherosclerosis.  A personal history of stroke or heart disease.  Older age.  Being inactive or overweight.  Smoking cigarettes.  Having another health condition such as:  Diabetes.  High blood pressure.  High cholesterol. SIGNS AND SYMPTOMS  Your hip or leg may:   Ache.  Cramp.  Feel tight.  Feel weak.  Feel heavy. Over time, you may feel pain in your calf, thigh, or hip. DIAGNOSIS  Your health care provider may diagnose intermittent claudication based on your symptoms and medical history. Your health care provider may also do tests to learn more about your condition. These may include:  Blood tests.  An ultrasound.  Imaging tests such as angiography, magnetic resonance angiography (MRA), and computed tomography angiography (CTA). TREATMENT You may be treated for problems such as:  High blood pressure.  High cholesterol.  Diabetes. Other treatments may include:  Lifestyle changes such as:  Starting an exercise program.  Losing weight.  Quitting smoking.  Medicines to help restore blood flow through your legs.  Blood vessel surgery (angioplasty) to restore blood flow if your intermittent claudication is caused by severe peripheral artery disease. HOME CARE INSTRUCTIONS  Manage any other health conditions you have.  Eat a diet low in saturated fats and calories to maintain a  healthy weight.  Quit smoking, if you smoke.  Take medicines only as directed by your health care provider.  If your health care provider recommended an exercise program for you, follow it as directed. Your exercise program may involve:  Walking three or more times a week.  Walking until you have certain symptoms of intermittent claudication.  Resting until symptoms go away.  Gradually increasing walking time to about 50 minutes a day. SEEK MEDICAL CARE IF: Your condition is not getting better or is getting worse. SEEK IMMEDIATE MEDICAL CARE IF:   You have chest pain.  You have difficulty breathing.  You develop arm weakness.  You have trouble speaking.  Your face begins to droop. MAKE SURE YOU:  Understand these instructions.  Will watch your condition.  Will get help if you are not doing well or get worse. This information is not intended to replace advice given to you by your health care provider. Make sure you discuss any questions you have with your health care provider. Document Released: 07/01/2004 Document Revised: 09/19/2014 Document Reviewed: 12/05/2013 Elsevier Interactive Patient Education  2017 [employer].      Steps to Quit Smoking Smoking tobacco can be bad for your health. It can also affect almost every organ in your body. Smoking puts you and people around you at risk for many serious long-lasting (chronic) diseases. Quitting smoking is hard, but it is one of the best things that you can do for your health. It is never too late to quit. What are the benefits of quitting smoking? When you quit smoking, you lower your risk for getting serious diseases and conditions. They can include:  Lung cancer or lung disease.  Heart disease.  Stroke.  Heart attack.  Not being able to have children (infertility).  Weak bones (osteoporosis) and broken bones (fractures). If you have coughing, wheezing, and shortness of breath, those symptoms may get  better when you quit. You may also get sick less often. If you are pregnant, quitting smoking can help to lower your chances of having a baby of low birth weight. What can I do to help me quit smoking? Talk with your doctor about what can help you quit smoking. Some things you can do (strategies) include:  Quitting smoking totally, instead of slowly cutting back how much you smoke over a period of time.  Going to in-person counseling. You are more likely to quit if you go to many counseling sessions.  Using resources and support systems, such as:  Online chats with a Social worker.  Phone quitlines.  Printed Furniture conservator/restorer.  Support groups or group counseling.  Text messaging programs.  Mobile phone apps or applications.  Taking medicines. Some of these medicines may have nicotine in them. If you are pregnant or breastfeeding, do not take any medicines to quit smoking unless your doctor says it is okay. Talk with your doctor about counseling or other things that can help you. Talk with your doctor about using more than one strategy at the same time, such as taking medicines while you are also going to in-person counseling. This can help make quitting easier. What things can I do to make it easier to quit? Quitting smoking might feel very hard at first, but there is a lot that you can do to make it easier. Take these steps:  Talk to your family and friends. Ask them to support and encourage you.  Call phone quitlines, reach out to support groups, or work with a Social worker.  Ask people who smoke to not smoke around you.  Avoid places that make you want (trigger) to smoke, such as:  Bars.  Parties.  Smoke-break areas at work.  Spend time with people who do not smoke.  Lower the stress in your life. Stress can make you want to smoke. Try these things to help your stress:  Getting regular exercise.  Deep-breathing exercises.  Yoga.  Meditating.  Doing a body scan. To  do this, close your eyes, focus on one area of your body at a time from head to toe, and notice which parts of your body are tense. Try to relax the muscles in those areas.  Download or buy apps on your mobile phone or tablet that can help you stick to your quit plan. There are many free apps, such as QuitGuide from the [employer] Office manager for Disease Control and Prevention). You can find more support from smokefree.gov and other websites. This information is not intended to replace advice given to you by your health care provider. Make sure you discuss any questions you have with your health care provider. Document Released: 06/25/2009 Document Revised: 04/26/2016 Document Reviewed: 01/13/2015 Elsevier Interactive Patient Education  2017 [employer].

## 2016-10-10 NOTE — Progress Notes (Signed)
CC: Follow up Intermittent Claudication  History of Present Illness  Raymond [name] is a 56 y.o. (1961/07/14) male patient of Dr. Trula Slade returns for followup of his bilateral claudication. He had a good response to cilostazol, however was not taking the medication as directed, therefore he was having some side effects of dizziness. Once we got him on the appropriate dosing, he has done very well. He does note that he has trouble when he walks up an incline or is pushing heavy objects, otherwise he is able to tolerate his symptoms. He denies nonhealing wounds. He continues to smoke but is cut back to a half a pack a day. He continues to be medically managed for his hypercholesterolemia with a statin. His blood pressure is managed with a ACE inhibitor.  Dr. Trula Slade last evaluated pt on 01-21-13. At that time pt had bilateral claudication, right greater than left The patient was doing well with cilostazol. Dr. Trula Slade advised to return in one year for followup with ABIs.  Patient has been lost to follow up until today.   He denies any history of stroke or TIA; denies any history of cardiac problems.   Pt Diabetic: pt states he does not think he has DM.  Pt smoker: smoker  (1/2 ppd, started at age 45 yrs)  Pt meds include: Statin :Yes Betablocker: No ASA: Yes Other anticoagulants/antiplatelets: no   Past Medical History:  Diagnosis Date  . Cancer    melanoma  . Cervical spondylosis with myelopathy    Arthritis  . Diabetes mellitus    Type II  . Hyperlipidemia   . Hypertension   . Impotence of organic origin   . Insomnia   . Peripheral arterial disease   . Restless leg syndrome     Social History Social History  Substance Use Topics  . Smoking status: Current Every Day Smoker    Packs/day: 0.50    Types: Cigarettes  . Smokeless tobacco: Never Used  . Alcohol use 3.6 oz/week    6 Cans of beer per week    Family History Family History  Problem Relation Age of  Onset  . Cancer Mother   . Hypertension Father   . Hypertension Sister     Surgical History Past Surgical History:  Procedure Laterality Date  . spinal fusion  2000  . SPINE SURGERY  2000   Cervical Myelopathy- S/P fusion    No Known Allergies  Current Outpatient Prescriptions  Medication Sig Dispense Refill  . acetaminophen (TYLENOL) 100 MG/ML solution Take 10 mg/kg by mouth every 4 (four) hours as needed.    Marland Kitchen amLODipine (NORVASC) 10 MG tablet Take 10 mg by mouth daily.    Marland Kitchen aspirin 81 MG tablet Take 81 mg by mouth daily.    . cilostazol (PLETAL) 100 MG tablet Take 1 tablet (100 mg total) by mouth 2 (two) times daily. 60 tablet 5  . lisinopril (PRINIVIL,ZESTRIL) 40 MG tablet Take 40 mg by mouth 2 (two) times daily.     . traZODone (DESYREL) 100 MG tablet Take 100 mg by mouth at bedtime.    Marland Kitchen atorvastatin (LIPITOR) 10 MG tablet Take 10 mg by mouth daily.     No current facility-administered medications for this visit.     REVIEW OF SYSTEMS: see HPI for pertinent positives and negatives    Physical Examination  Vitals:   10/10/16 1128  BP: 126/72  Pulse: 100  Resp: 18  Temp: 97.2 F (36.2 C)  TempSrc: Oral  SpO2: 99%  Weight: 175 lb (79.4 kg)  Height: 6\' 1"  (1.854 m)   Body mass index is 23.09 kg/m.  General: The patient appears their stated age.   HEENT:  No gross abnormalities Pulmonary: Respirations are non-labored Abdomen: Soft and non-tender, normal pitched bowel sounds. Musculoskeletal: There are no major deformities.   Neurologic: No focal weakness or paresthesias are detected. CN 2-12 are grossly intact.  Skin: There are no ulcers or rashes noted. Psychiatric: The patient has normal affect. Cardiovascular: There is a regular rate and rhythm without significant murmur appreciated.   Vascular: Vessel Right Left  Radial 2+Palpable 2+Palpable  Carotid  without bruit  without bruit  Aorta Not palpable N/A  Femoral Not Palpable 2+Palpable  Popliteal  Not palpable Not palpable  PT Not Palpable Not Palpable  DP Not Palpable Not Palpable   Non-Invasive Vascular Imaging ABI (Date: 10/10/2016)  R: 0.47 (0.34 in 2013), DP: mono, PT: mono, TBI: 0.00  L: 0.51 (0.50 in 2013), DP: mono, PT: mono, TBI: 0.25   Medical Decision Making  Raymond [name] is a 56 y.o. male who presents with: no leg intermittent claudication unless he climbs and incline. There are no signs of ischemia in his feet/legs. Unfortunately he continues to smoke but seems motivated to quit.    Based on the patient's vascular studies and examination, I have offered the patient: ABI's in 1 year.  I advised him to notify us if he develops worsening symptoms in his legs or sores in his feet/legs that are slow to heal.  Graduated walking program discussed and how to achieve.  The patient was counseled re smoking cessation and given several free resources re smoking cessation.   I discussed in depth with the patient the nature of atherosclerosis, and emphasized the importance of maximal medical management including strict control of blood pressure, blood glucose, and lipid levels, antiplatelet agents, obtaining regular exercise, and cessation of smoking.         I discussed with the patient the natural history of intermittent claudication: 75% of patients have stable or improved symptoms in a year and only 2% require amputation. Eventually 20% may require intervention in a year.  The patient is aware that without maximal medical management the underlying atherosclerotic disease process will progress, limiting the benefit of any interventions. The patient is currently on a statin.   The patient is currently on an anti-platelet: ASA 81 mg daily. He is taking Pletal for his claudication which has helped.   Thank you for allowing [place] to participate in this patient's care.  Nehal Shives, Sharmon Leyden, RN, MSN, FNP-C [facility] Office:  [phone]  10/10/2016, 11:51 AM  Clinic MD: Trula Slade

## 2016-10-19 NOTE — Addendum Note (Signed)
Addended by: Lianne Cure A on: 10/19/2016 09:56 AM   Modules accepted: Orders

## 2017-01-04 ENCOUNTER — Other Ambulatory Visit: Payer: Self-pay | Admitting: Surgery

## 2017-01-04 DIAGNOSIS — I70219 Atherosclerosis of native arteries of extremities with intermittent claudication, unspecified extremity: Secondary | ICD-10-CM

## 2017-05-10 DIAGNOSIS — Z6823 Body mass index (BMI) 23.0-23.9, adult: Secondary | ICD-10-CM | POA: Diagnosis not present

## 2017-05-10 DIAGNOSIS — F17210 Nicotine dependence, cigarettes, uncomplicated: Secondary | ICD-10-CM | POA: Diagnosis not present

## 2017-05-10 DIAGNOSIS — E782 Mixed hyperlipidemia: Secondary | ICD-10-CM | POA: Diagnosis not present

## 2017-05-10 DIAGNOSIS — E1165 Type 2 diabetes mellitus with hyperglycemia: Secondary | ICD-10-CM | POA: Diagnosis not present

## 2017-05-10 DIAGNOSIS — M4712 Other spondylosis with myelopathy, cervical region: Secondary | ICD-10-CM | POA: Diagnosis not present

## 2017-05-10 DIAGNOSIS — I10 Essential (primary) hypertension: Secondary | ICD-10-CM | POA: Diagnosis not present

## 2017-05-10 DIAGNOSIS — I739 Peripheral vascular disease, unspecified: Secondary | ICD-10-CM | POA: Diagnosis not present

## 2017-05-10 DIAGNOSIS — G4700 Insomnia, unspecified: Secondary | ICD-10-CM | POA: Diagnosis not present

## 2017-08-04 DIAGNOSIS — E1165 Type 2 diabetes mellitus with hyperglycemia: Secondary | ICD-10-CM | POA: Diagnosis not present

## 2017-08-04 DIAGNOSIS — E782 Mixed hyperlipidemia: Secondary | ICD-10-CM | POA: Diagnosis not present

## 2017-08-04 DIAGNOSIS — I10 Essential (primary) hypertension: Secondary | ICD-10-CM | POA: Diagnosis not present

## 2017-08-04 DIAGNOSIS — F17210 Nicotine dependence, cigarettes, uncomplicated: Secondary | ICD-10-CM | POA: Diagnosis not present

## 2017-08-04 DIAGNOSIS — I739 Peripheral vascular disease, unspecified: Secondary | ICD-10-CM | POA: Diagnosis not present

## 2017-08-07 DIAGNOSIS — M25511 Pain in right shoulder: Secondary | ICD-10-CM | POA: Diagnosis not present

## 2017-08-07 DIAGNOSIS — Z79899 Other long term (current) drug therapy: Secondary | ICD-10-CM | POA: Diagnosis not present

## 2017-08-07 DIAGNOSIS — F17200 Nicotine dependence, unspecified, uncomplicated: Secondary | ICD-10-CM | POA: Diagnosis not present

## 2017-08-07 DIAGNOSIS — I10 Essential (primary) hypertension: Secondary | ICD-10-CM | POA: Diagnosis not present

## 2017-08-07 DIAGNOSIS — Z7982 Long term (current) use of aspirin: Secondary | ICD-10-CM | POA: Diagnosis not present

## 2017-08-07 DIAGNOSIS — S46011A Strain of muscle(s) and tendon(s) of the rotator cuff of right shoulder, initial encounter: Secondary | ICD-10-CM | POA: Diagnosis not present

## 2017-08-07 DIAGNOSIS — X500XXA Overexertion from strenuous movement or load, initial encounter: Secondary | ICD-10-CM | POA: Diagnosis not present

## 2017-08-09 DIAGNOSIS — I10 Essential (primary) hypertension: Secondary | ICD-10-CM | POA: Diagnosis not present

## 2017-08-09 DIAGNOSIS — Z6823 Body mass index (BMI) 23.0-23.9, adult: Secondary | ICD-10-CM | POA: Diagnosis not present

## 2017-08-09 DIAGNOSIS — E871 Hypo-osmolality and hyponatremia: Secondary | ICD-10-CM | POA: Diagnosis not present

## 2017-08-09 DIAGNOSIS — I739 Peripheral vascular disease, unspecified: Secondary | ICD-10-CM | POA: Diagnosis not present

## 2017-08-09 DIAGNOSIS — E782 Mixed hyperlipidemia: Secondary | ICD-10-CM | POA: Diagnosis not present

## 2017-08-09 DIAGNOSIS — S46011A Strain of muscle(s) and tendon(s) of the rotator cuff of right shoulder, initial encounter: Secondary | ICD-10-CM | POA: Diagnosis not present

## 2017-08-09 DIAGNOSIS — E1165 Type 2 diabetes mellitus with hyperglycemia: Secondary | ICD-10-CM | POA: Diagnosis not present

## 2017-08-09 DIAGNOSIS — M4712 Other spondylosis with myelopathy, cervical region: Secondary | ICD-10-CM | POA: Diagnosis not present

## 2017-09-04 ENCOUNTER — Other Ambulatory Visit: Payer: Self-pay

## 2017-09-04 DIAGNOSIS — I70219 Atherosclerosis of native arteries of extremities with intermittent claudication, unspecified extremity: Secondary | ICD-10-CM

## 2017-09-04 MED ORDER — CILOSTAZOL 100 MG PO TABS: ORAL_TABLET | ORAL | 1 refills | Status: DC

## 2017-09-14 DIAGNOSIS — F17210 Nicotine dependence, cigarettes, uncomplicated: Secondary | ICD-10-CM | POA: Diagnosis not present

## 2017-09-14 DIAGNOSIS — R05 Cough: Secondary | ICD-10-CM | POA: Diagnosis not present

## 2017-09-14 DIAGNOSIS — J019 Acute sinusitis, unspecified: Secondary | ICD-10-CM | POA: Diagnosis not present

## 2017-09-14 DIAGNOSIS — Z6823 Body mass index (BMI) 23.0-23.9, adult: Secondary | ICD-10-CM | POA: Diagnosis not present

## 2017-10-02 ENCOUNTER — Other Ambulatory Visit: Payer: Self-pay | Admitting: Surgery

## 2017-10-02 DIAGNOSIS — I70219 Atherosclerosis of native arteries of extremities with intermittent claudication, unspecified extremity: Secondary | ICD-10-CM

## 2017-10-03 DIAGNOSIS — I10 Essential (primary) hypertension: Secondary | ICD-10-CM | POA: Diagnosis not present

## 2017-10-03 DIAGNOSIS — E1165 Type 2 diabetes mellitus with hyperglycemia: Secondary | ICD-10-CM | POA: Diagnosis not present

## 2017-10-03 DIAGNOSIS — I739 Peripheral vascular disease, unspecified: Secondary | ICD-10-CM | POA: Diagnosis not present

## 2017-10-03 DIAGNOSIS — E782 Mixed hyperlipidemia: Secondary | ICD-10-CM | POA: Diagnosis not present

## 2017-10-03 DIAGNOSIS — E871 Hypo-osmolality and hyponatremia: Secondary | ICD-10-CM | POA: Diagnosis not present

## 2017-10-03 DIAGNOSIS — F17210 Nicotine dependence, cigarettes, uncomplicated: Secondary | ICD-10-CM | POA: Diagnosis not present

## 2017-10-04 DIAGNOSIS — Z0001 Encounter for general adult medical examination with abnormal findings: Secondary | ICD-10-CM | POA: Diagnosis not present

## 2017-10-04 DIAGNOSIS — Z23 Encounter for immunization: Secondary | ICD-10-CM | POA: Diagnosis not present

## 2017-10-04 DIAGNOSIS — E1165 Type 2 diabetes mellitus with hyperglycemia: Secondary | ICD-10-CM | POA: Diagnosis not present

## 2017-10-04 DIAGNOSIS — I739 Peripheral vascular disease, unspecified: Secondary | ICD-10-CM | POA: Diagnosis not present

## 2017-10-04 DIAGNOSIS — M4712 Other spondylosis with myelopathy, cervical region: Secondary | ICD-10-CM | POA: Diagnosis not present

## 2017-10-04 DIAGNOSIS — Z6823 Body mass index (BMI) 23.0-23.9, adult: Secondary | ICD-10-CM | POA: Diagnosis not present

## 2017-10-04 DIAGNOSIS — I10 Essential (primary) hypertension: Secondary | ICD-10-CM | POA: Diagnosis not present

## 2017-10-04 DIAGNOSIS — E782 Mixed hyperlipidemia: Secondary | ICD-10-CM | POA: Diagnosis not present

## 2017-10-08 DIAGNOSIS — Z7982 Long term (current) use of aspirin: Secondary | ICD-10-CM | POA: Diagnosis not present

## 2017-10-08 DIAGNOSIS — Z79899 Other long term (current) drug therapy: Secondary | ICD-10-CM | POA: Diagnosis not present

## 2017-10-08 DIAGNOSIS — F17200 Nicotine dependence, unspecified, uncomplicated: Secondary | ICD-10-CM | POA: Diagnosis not present

## 2017-10-08 DIAGNOSIS — R05 Cough: Secondary | ICD-10-CM | POA: Diagnosis not present

## 2017-10-08 DIAGNOSIS — I10 Essential (primary) hypertension: Secondary | ICD-10-CM | POA: Diagnosis not present

## 2017-10-08 DIAGNOSIS — J209 Acute bronchitis, unspecified: Secondary | ICD-10-CM | POA: Diagnosis not present

## 2017-10-08 DIAGNOSIS — Z981 Arthrodesis status: Secondary | ICD-10-CM | POA: Diagnosis not present

## 2017-10-08 DIAGNOSIS — Z720 Tobacco use: Secondary | ICD-10-CM | POA: Diagnosis not present

## 2017-10-16 ENCOUNTER — Ambulatory Visit: Payer: Medicare Other | Admitting: Family

## 2017-10-16 ENCOUNTER — Encounter (HOSPITAL_COMMUNITY): Payer: Medicare Other

## 2017-10-27 ENCOUNTER — Other Ambulatory Visit: Payer: Self-pay

## 2017-10-27 DIAGNOSIS — I70219 Atherosclerosis of native arteries of extremities with intermittent claudication, unspecified extremity: Secondary | ICD-10-CM

## 2017-10-27 MED ORDER — CILOSTAZOL 100 MG PO TABS: 100.0000 mg | ORAL_TABLET | Freq: Two times a day (BID) | ORAL | 0 refills | Status: DC

## 2017-12-22 ENCOUNTER — Other Ambulatory Visit: Payer: Self-pay | Admitting: Surgery

## 2017-12-22 DIAGNOSIS — I70219 Atherosclerosis of native arteries of extremities with intermittent claudication, unspecified extremity: Secondary | ICD-10-CM

## 2018-01-08 ENCOUNTER — Ambulatory Visit (INDEPENDENT_AMBULATORY_CARE_PROVIDER_SITE_OTHER): Payer: Medicare Other | Admitting: Family

## 2018-01-08 ENCOUNTER — Ambulatory Visit (HOSPITAL_COMMUNITY)
Admission: RE | Admit: 2018-01-08 | Discharge: 2018-01-08 | Disposition: A | Discharge status: Home / Self Care | Payer: Medicare Other | Source: Ambulatory Visit | Attending: Family | Admitting: Family

## 2018-01-08 ENCOUNTER — Encounter: Payer: Self-pay | Admitting: Family

## 2018-01-08 VITALS — BP 147/86 | HR 93 | Temp 97.2°F | Resp 16 | Ht 73.0 in | Wt 173.1 lb

## 2018-01-08 DIAGNOSIS — E785 Hyperlipidemia, unspecified: Secondary | ICD-10-CM | POA: Insufficient documentation

## 2018-01-08 DIAGNOSIS — I70213 Atherosclerosis of native arteries of extremities with intermittent claudication, bilateral legs: Secondary | ICD-10-CM | POA: Diagnosis not present

## 2018-01-08 DIAGNOSIS — I779 Disorder of arteries and arterioles, unspecified: Secondary | ICD-10-CM

## 2018-01-08 DIAGNOSIS — F17200 Nicotine dependence, unspecified, uncomplicated: Secondary | ICD-10-CM | POA: Insufficient documentation

## 2018-01-08 MED ORDER — CILOSTAZOL 100 MG PO TABS: 100.0000 mg | ORAL_TABLET | Freq: Two times a day (BID) | ORAL | 2 refills | Status: DC

## 2018-01-08 NOTE — Progress Notes (Signed)
Pt. Is here for 1 year follow-up. Pt. Request if he can get Cilostazol refill for 90days.

## 2018-01-08 NOTE — Progress Notes (Signed)
[facility]   CC: Follow up peripheral artery occlusive disease  History of Present Illness Raymond [name] is a 56 y.o. male whom Dr. Trula Slade has been monitoring for bilateral claudication.   He had a good response to cilostazol, however was not taking the medication as directed, therefore he was having some side effects of dizziness. Once we got him on the appropriate dosing, he has done very well. He does note that he has trouble when he walks up an incline or is pushing heavy objects, otherwise he is able to tolerate his symptoms. He denies nonhealing wounds.  Dr. Trula Slade last evaluated pt on 01-21-13. At that time pt had bilateral claudication,right greater than left The patient was doing well with cilostazol. Dr. Trula Slade advised to return in one year for followup with ABIs.  He denies any history of stroke or TIA; denies any history of cardiac problems.   Pt Diabetic: pt states he does not think he has DM.  Pt smoker: smoker  (decreased to 2 cigarettes/day from 1/2 ppd, started at age 5 yrs)  Pt meds include: Statin :Yes Betablocker: No ASA: Yes Other anticoagulants/antiplatelets: no      Past Medical History:  Diagnosis Date  . Cancer ([place])    melanoma  . Cervical spondylosis with myelopathy    Arthritis  . Diabetes mellitus    Type II  . Hyperlipidemia   . Hypertension   . Impotence of organic origin   . Insomnia   . Peripheral arterial disease ([place])   . Restless leg syndrome     Social History Social History   Tobacco Use  . Smoking status: Current Every Day Smoker    Packs/day: 0.50    Types: Cigarettes  . Smokeless tobacco: Never Used  Substance Use Topics  . Alcohol use: Yes    Alcohol/week: 3.6 oz    Types: 6 Cans of beer per week  . Drug use: No    Family History Family History  Problem Relation Age of Onset  . Cancer Mother   . Hypertension Father   . Hypertension Sister     Past Surgical History:   Procedure Laterality Date  . spinal fusion  2000  . SPINE SURGERY  2000   Cervical Myelopathy- S/P fusion    No Known Allergies  Current Outpatient Medications  Medication Sig Dispense Refill  . acetaminophen (TYLENOL) 100 MG/ML solution Take 10 mg/kg by mouth every 4 (four) hours as needed.    Marland Kitchen amLODipine (NORVASC) 10 MG tablet Take 10 mg by mouth daily.    Marland Kitchen aspirin 81 MG tablet Take 81 mg by mouth daily.    Marland Kitchen atorvastatin (LIPITOR) 10 MG tablet Take 10 mg by mouth daily.    . cilostazol (PLETAL) 100 MG tablet TAKE (1) TABLET TWICE DAILY. 60 tablet 0  . lisinopril (PRINIVIL,ZESTRIL) 40 MG tablet Take 40 mg by mouth 2 (two) times daily.     . metFORMIN (GLUCOPHAGE) 500 MG tablet Take by mouth 2 (two) times daily with a meal.    . traZODone (DESYREL) 100 MG tablet Take 100 mg by mouth at bedtime.     No current facility-administered medications for this visit.     ROS: See HPI for pertinent positives and negatives.   Physical Examination  Vitals:   01/08/18 1430 01/08/18 1432  BP: (!) 159/89 (!) 147/86  Pulse: 93   Resp: 16   Temp: (!) 97.2 F (36.2 C)   TempSrc: Oral  SpO2: 99%   Weight: 173 lb 1.6 oz (78.5 kg)   Height: 6\' 1"  (1.854 m)    Body mass index is 22.84 kg/m.  General: The patient appears his stated age.   HEENT:  No gross abnormalities Pulmonary: Respirations are non-labored, CTAB Abdomen: Soft and non-tender, normal pitched bowel sounds. Musculoskeletal: There are no major deformities.   Neurologic: No focal weakness or paresthesias are detected. CN 2-12 are grossly intact.  Skin: There are no ulcers or rashes noted. Psychiatric: The patient has normal affect. Cardiovascular: There is a regular rate and rhythm without significant murmur appreciated.  Psychiatric: Thought content is normal, mood appropriate for clinical situation.    Vascular: Vessel Right Left  Radial 2+Palpable 2+Palpable  Carotid  without bruit  without bruit  Aorta Not  palpable N/A  Femoral 1+ Palpable 2+Palpable  Popliteal Not palpable Not palpable  PT Not Palpable Not Palpable  DP Not Palpable Not Palpable     ASSESSMENT: Raymond [name] is a 57 y.o. male who presents with: no intermittent claudication in his legs unless he climbs an incline.  He is doing lawn care work.   There are no signs of ischemia in his feet/legs. Unfortunately he continues to smoke but seems motivated to quit.  Cilostizol was reordered at pt request, 90 days with 2 refills.     DATA  ABI (Date: 01/08/2018):  R:   ABI: 0.44 (was 0.47 on 10-10-16),   PT: mono  DP: mono  TBI:  0.38 (was 0.00)  L:   ABI: 0.62 (was 0.51),   PT: mono  DP: mono  TBI: 0.46 (was 0.25)  Stable ABI on the right with severe disease, improved ABI on the left with moderate disease; all monophasic waveforms.   Improved bilateral TBI.    Plan  Continue extensive walking.    Based on the patient's vascular studies and examination, I have offered the patient: ABI's in 6 months.  I advised him to notify us if he develops concerns re the circulation in his feet or legs.   The patient was counseled re smoking cessation and given several free resources re smoking cessation.  I discussed in depth with the patient the nature of atherosclerosis, and emphasized the importance of maximal medical management including strict control of blood pressure, blood glucose, and lipid levels, obtaining regular exercise, and cessation of smoking.  The patient is aware that without maximal medical management the underlying atherosclerotic disease process will progress, limiting the benefit of any interventions.  The patient was given information about PAD including signs, symptoms, treatment, what symptoms should prompt the patient to seek immediate medical care, and risk reduction measures to take.  Clemon Chambers, RN, MSN, FNP-C [facility] Phone:  [phone]  Clinic MD: Trula Slade  01/08/18 3:07 PM

## 2018-01-08 NOTE — Patient Instructions (Signed)
Steps to Quit Smoking Smoking tobacco can be bad for your health. It can also affect almost every organ in your body. Smoking puts you and people around you at risk for many serious long-lasting (chronic) diseases. Quitting smoking is hard, but it is one of the best things that you can do for your health. It is never too late to quit. What are the benefits of quitting smoking? When you quit smoking, you lower your risk for getting serious diseases and conditions. They can include:  Lung cancer or lung disease.  Heart disease.  Stroke.  Heart attack.  Not being able to have children (infertility).  Weak bones (osteoporosis) and broken bones (fractures).  If you have coughing, wheezing, and shortness of breath, those symptoms may get better when you quit. You may also get sick less often. If you are pregnant, quitting smoking can help to lower your chances of having a baby of low birth weight. What can I do to help me quit smoking? Talk with your doctor about what can help you quit smoking. Some things you can do (strategies) include:  Quitting smoking totally, instead of slowly cutting back how much you smoke over a period of time.  Going to in-person counseling. You are more likely to quit if you go to many counseling sessions.  Using resources and support systems, such as: ? Online chats with a counselor. ? Phone quitlines. ? Printed self-help materials. ? Support groups or group counseling. ? Text messaging programs. ? Mobile phone apps or applications.  Taking medicines. Some of these medicines may have nicotine in them. If you are pregnant or breastfeeding, do not take any medicines to quit smoking unless your doctor says it is okay. Talk with your doctor about counseling or other things that can help you.  Talk with your doctor about using more than one strategy at the same time, such as taking medicines while you are also going to in-person counseling. This can help make  quitting easier. What things can I do to make it easier to quit? Quitting smoking might feel very hard at first, but there is a lot that you can do to make it easier. Take these steps:  Talk to your family and friends. Ask them to support and encourage you.  Call phone quitlines, reach out to support groups, or work with a counselor.  Ask people who smoke to not smoke around you.  Avoid places that make you want (trigger) to smoke, such as: ? Bars. ? Parties. ? Smoke-break areas at work.  Spend time with people who do not smoke.  Lower the stress in your life. Stress can make you want to smoke. Try these things to help your stress: ? Getting regular exercise. ? Deep-breathing exercises. ? Yoga. ? Meditating. ? Doing a body scan. To do this, close your eyes, focus on one area of your body at a time from head to toe, and notice which parts of your body are tense. Try to relax the muscles in those areas.  Download or buy apps on your mobile phone or tablet that can help you stick to your quit plan. There are many free apps, such as QuitGuide from the CDC (Centers for Disease Control and Prevention). You can find more support from smokefree.gov and other websites.  This information is not intended to replace advice given to you by your health care provider. Make sure you discuss any questions you have with your health care provider. Document Released: 06/25/2009 Document   Revised: 04/26/2016 Document Reviewed: 01/13/2015 Elsevier Interactive Patient Education  2018 [employer] Inc.     Peripheral Vascular Disease Peripheral vascular disease (PVD) is a disease of the blood vessels that are not part of your heart and brain. A simple term for PVD is poor circulation. In most cases, PVD narrows the blood vessels that carry blood from your heart to the rest of your body. This can result in a decreased supply of blood to your arms, legs, and internal organs, like your stomach or kidneys.  However, it most often affects a person's lower legs and feet. There are two types of PVD.  Organic PVD. This is the more common type. It is caused by damage to the structure of blood vessels.  Functional PVD. This is caused by conditions that make blood vessels contract and tighten (spasm).  Without treatment, PVD tends to get worse over time. PVD can also lead to acute ischemic limb. This is when an arm or limb suddenly has trouble getting enough blood. This is a medical emergency. Follow these instructions at home:  Take medicines only as told by your doctor.  Do not use any tobacco products, including cigarettes, chewing tobacco, or electronic cigarettes. If you need help quitting, ask your doctor.  Lose weight if you are overweight, and maintain a healthy weight as told by your doctor.  Eat a diet that is low in fat and cholesterol. If you need help, ask your doctor.  Exercise regularly. Ask your doctor for some good activities for you.  Take good care of your feet. ? Wear comfortable shoes that fit well. ? Check your feet often for any cuts or sores. Contact a doctor if:  You have cramps in your legs while walking.  You have leg pain when you are at rest.  You have coldness in a leg or foot.  Your skin changes.  You are unable to get or have an erection (erectile dysfunction).  You have cuts or sores on your feet that are not healing. Get help right away if:  Your arm or leg turns cold and blue.  Your arms or legs become red, warm, swollen, painful, or numb.  You have chest pain or trouble breathing.  You suddenly have weakness in your face, arm, or leg.  You become very confused or you cannot speak.  You suddenly have a very bad headache.  You suddenly cannot see. This information is not intended to replace advice given to you by your health care provider. Make sure you discuss any questions you have with your health care provider. Document Released:  11/23/2009 Document Revised: 02/04/2016 Document Reviewed: 02/06/2014 Elsevier Interactive Patient Education  2017 [employer] Inc.  

## 2018-01-09 DIAGNOSIS — I739 Peripheral vascular disease, unspecified: Secondary | ICD-10-CM | POA: Diagnosis not present

## 2018-01-09 DIAGNOSIS — I10 Essential (primary) hypertension: Secondary | ICD-10-CM | POA: Diagnosis not present

## 2018-01-09 DIAGNOSIS — Z6823 Body mass index (BMI) 23.0-23.9, adult: Secondary | ICD-10-CM | POA: Diagnosis not present

## 2018-01-09 DIAGNOSIS — F17210 Nicotine dependence, cigarettes, uncomplicated: Secondary | ICD-10-CM | POA: Diagnosis not present

## 2018-01-09 DIAGNOSIS — E1165 Type 2 diabetes mellitus with hyperglycemia: Secondary | ICD-10-CM | POA: Diagnosis not present

## 2018-01-09 DIAGNOSIS — E782 Mixed hyperlipidemia: Secondary | ICD-10-CM | POA: Diagnosis not present

## 2018-01-19 ENCOUNTER — Other Ambulatory Visit: Payer: Self-pay

## 2018-01-19 DIAGNOSIS — I779 Disorder of arteries and arterioles, unspecified: Secondary | ICD-10-CM

## 2018-01-22 ENCOUNTER — Other Ambulatory Visit: Payer: Self-pay | Admitting: Vascular Surgery

## 2018-01-22 DIAGNOSIS — I70219 Atherosclerosis of native arteries of extremities with intermittent claudication, unspecified extremity: Secondary | ICD-10-CM

## 2018-04-09 DIAGNOSIS — I10 Essential (primary) hypertension: Secondary | ICD-10-CM | POA: Diagnosis not present

## 2018-04-09 DIAGNOSIS — E871 Hypo-osmolality and hyponatremia: Secondary | ICD-10-CM | POA: Diagnosis not present

## 2018-04-09 DIAGNOSIS — E1165 Type 2 diabetes mellitus with hyperglycemia: Secondary | ICD-10-CM | POA: Diagnosis not present

## 2018-04-09 DIAGNOSIS — E782 Mixed hyperlipidemia: Secondary | ICD-10-CM | POA: Diagnosis not present

## 2018-04-09 DIAGNOSIS — F17210 Nicotine dependence, cigarettes, uncomplicated: Secondary | ICD-10-CM | POA: Diagnosis not present

## 2018-04-12 DIAGNOSIS — M4712 Other spondylosis with myelopathy, cervical region: Secondary | ICD-10-CM | POA: Diagnosis not present

## 2018-04-12 DIAGNOSIS — E782 Mixed hyperlipidemia: Secondary | ICD-10-CM | POA: Diagnosis not present

## 2018-04-12 DIAGNOSIS — I10 Essential (primary) hypertension: Secondary | ICD-10-CM | POA: Diagnosis not present

## 2018-04-12 DIAGNOSIS — E871 Hypo-osmolality and hyponatremia: Secondary | ICD-10-CM | POA: Diagnosis not present

## 2018-04-12 DIAGNOSIS — I739 Peripheral vascular disease, unspecified: Secondary | ICD-10-CM | POA: Diagnosis not present

## 2018-04-12 DIAGNOSIS — E1165 Type 2 diabetes mellitus with hyperglycemia: Secondary | ICD-10-CM | POA: Diagnosis not present

## 2018-04-12 DIAGNOSIS — F17210 Nicotine dependence, cigarettes, uncomplicated: Secondary | ICD-10-CM | POA: Diagnosis not present

## 2018-04-12 DIAGNOSIS — Z6823 Body mass index (BMI) 23.0-23.9, adult: Secondary | ICD-10-CM | POA: Diagnosis not present

## 2018-05-23 ENCOUNTER — Other Ambulatory Visit: Payer: Self-pay | Admitting: Surgery

## 2018-05-23 MED ORDER — CILOSTAZOL 100 MG PO TABS: 100.0000 mg | ORAL_TABLET | Freq: Two times a day (BID) | ORAL | 3 refills | Status: DC

## 2018-07-11 DIAGNOSIS — Z6823 Body mass index (BMI) 23.0-23.9, adult: Secondary | ICD-10-CM | POA: Diagnosis not present

## 2018-07-11 DIAGNOSIS — I739 Peripheral vascular disease, unspecified: Secondary | ICD-10-CM | POA: Diagnosis not present

## 2018-07-11 DIAGNOSIS — E782 Mixed hyperlipidemia: Secondary | ICD-10-CM | POA: Diagnosis not present

## 2018-07-11 DIAGNOSIS — E1151 Type 2 diabetes mellitus with diabetic peripheral angiopathy without gangrene: Secondary | ICD-10-CM | POA: Diagnosis not present

## 2018-07-11 DIAGNOSIS — Z23 Encounter for immunization: Secondary | ICD-10-CM | POA: Diagnosis not present

## 2018-07-11 DIAGNOSIS — I10 Essential (primary) hypertension: Secondary | ICD-10-CM | POA: Diagnosis not present

## 2018-07-11 DIAGNOSIS — E871 Hypo-osmolality and hyponatremia: Secondary | ICD-10-CM | POA: Diagnosis not present

## 2018-07-11 DIAGNOSIS — E1165 Type 2 diabetes mellitus with hyperglycemia: Secondary | ICD-10-CM | POA: Diagnosis not present

## 2018-07-16 ENCOUNTER — Encounter: Payer: Self-pay | Admitting: Family

## 2018-07-16 ENCOUNTER — Ambulatory Visit (INDEPENDENT_AMBULATORY_CARE_PROVIDER_SITE_OTHER): Payer: Medicare Other | Admitting: Family

## 2018-07-16 ENCOUNTER — Ambulatory Visit (HOSPITAL_COMMUNITY)
Admission: RE | Admit: 2018-07-16 | Discharge: 2018-07-16 | Disposition: A | Discharge status: Home / Self Care | Payer: Medicare Other | Source: Ambulatory Visit | Attending: Family | Admitting: Family

## 2018-07-16 VITALS — BP 163/89 | HR 91 | Temp 97.4°F | Resp 16 | Ht 73.0 in | Wt 177.0 lb

## 2018-07-16 DIAGNOSIS — I779 Disorder of arteries and arterioles, unspecified: Secondary | ICD-10-CM | POA: Diagnosis not present

## 2018-07-16 DIAGNOSIS — F17200 Nicotine dependence, unspecified, uncomplicated: Secondary | ICD-10-CM

## 2018-07-16 NOTE — Progress Notes (Signed)
[facility] [place]   CC: Follow up peripheral artery occlusive disease  History of Present Illness Raymond [name] is a 57 y.o. male whom Dr. Trula Slade has been monitoring for bilateral claudication.   He had a good response to cilostazol, however was not taking the medication as directed, therefore he was having some side effects of dizziness. Once we got him on the appropriate dosing, he has done very well. He does note that he has trouble when hewalks up an inclineor is pushing heavy objects, otherwise he is able to tolerate his symptoms. He denies nonhealing wounds.  Dr. Trula Slade last evaluated pt on 01-21-13. At that time pt had bilateral claudication,right greater than left The patient wasdoing well with cilostazol. Dr. Trula Slade advised to returnin one year for followup with ABIs.  He denies any historyof stroke or TIA; denies any historyof cardiac problems.   Diabetic:yes, no lab results on file, but he thinks his last A1C was 7.5 Tobacco QIH:[id number] (decreased to 1 cigarettes/day from 1/2ppd, started at age 42yrs)  Pt meds include: Statin :Yes Betablocker:No ASA:Yes Other anticoagulants/antiplatelets:no    Past Medical History:  Diagnosis Date  . Cancer ([place])    melanoma  . Cervical spondylosis with myelopathy    Arthritis  . Diabetes mellitus    Type II  . Hyperlipidemia   . Hypertension   . Impotence of organic origin   . Insomnia   . Peripheral arterial disease ([place])   . Restless leg syndrome     Social History Social History   Tobacco Use  . Smoking status: Current Some Day Smoker    Packs/day: 0.25    Types: Cigarettes  . Smokeless tobacco: Never Used  . Tobacco comment: smokes about 3 cigs in last month  Substance Use Topics  . Alcohol use: Yes    Alcohol/week: 2.0 standard drinks    Types: 2 Cans of beer per week    Comment: 2 cans per month  . Drug use: No    Family History Family History  Problem  Relation Age of Onset  . Cancer Mother   . Hypertension Father   . Hypertension Sister     Past Surgical History:  Procedure Laterality Date  . spinal fusion  2000  . SPINE SURGERY  2000   Cervical Myelopathy- S/P fusion    No Known Allergies  Current Outpatient Medications  Medication Sig Dispense Refill  . acetaminophen (TYLENOL) 100 MG/ML solution Take 10 mg/kg by mouth every 4 (four) hours as needed.    Marland Kitchen amLODipine (NORVASC) 10 MG tablet Take 10 mg by mouth daily.    Marland Kitchen aspirin 81 MG tablet Take 81 mg by mouth daily.    Marland Kitchen atorvastatin (LIPITOR) 10 MG tablet Take 10 mg by mouth daily.    . cilostazol (PLETAL) 100 MG tablet TAKE (1) TABLET TWICE DAILY. 60 tablet 0  . cilostazol (PLETAL) 100 MG tablet Take 1 tablet (100 mg total) by mouth 2 (two) times daily before a meal. 180 tablet 3  . lisinopril (PRINIVIL,ZESTRIL) 40 MG tablet Take 40 mg by mouth 2 (two) times daily.     . metFORMIN (GLUCOPHAGE) 500 MG tablet Take by mouth 2 (two) times daily with a meal.    . traZODone (DESYREL) 100 MG tablet Take 100 mg by mouth at bedtime.     No current facility-administered medications for this visit.     ROS: See HPI for pertinent positives and negatives.   Physical Examination  Vitals:  07/16/18 1212 07/16/18 1214  BP: (!) 177/92 (!) 163/89  Pulse: 91   Resp: 16   Temp: (!) 97.4 F (36.3 C)   TempSrc: Oral   SpO2: 98%   Weight: 177 lb (80.3 kg)   Height: 6\' 1"  (1.854 m)    Body mass index is 23.35 kg/m.  General: A&O x 3, WDWN, male. Gait: normal HENT: No gross abnormalities.  Eyes: PERRLA. Pulmonary: Respirations are non labored, CTAB, good air movement in all fields Cardiac: regular rhythm, no detected murmur.         Carotid Bruits Right Left   Negative Negative   Radial pulses are 2+ palpable bilaterally   Adominal aortic pulse is not palpable                         VASCULAR EXAM: Extremities without ischemic changes, without Gangrene; without open  wounds. Thick mycotic toenail of great toe of one foot.                                                                                                           LE Pulses Right Left       FEMORAL  1+ palpable  2+ palpable        POPLITEAL  not palpable   not palpable       POSTERIOR TIBIAL  not palpable   not palpable        DORSALIS PEDIS      ANTERIOR TIBIAL not palpable  not palpable    Abdomen: soft, NT, no palpable masses. Skin: no rashes, no cellulitis, no ulcers noted. Musculoskeletal: no muscle wasting or atrophy.  Neurologic: A&O X 3; appropriate affect, Sensation is normal; MOTOR FUNCTION:  moving all extremities equally, motor strength 5/5 throughout. Speech is fluent/normal. CN 2-12 intact. Psychiatric: Thought content is normal, mood appropriate for clinical situation.    ASSESSMENT: Raymond [name] is a 57 y.o. male who presents with: no intermittent claudication in his legs unless he climbs an incline.  He is doing lawn care work.   There are no signs of ischemia in his feet/legs. Unfortunately he continues to smoke but seems motivated to quit, has decreased to 1 cigarette/day.  Cilostizol was reordered at his April 2019 visit, at pt request, 90 days with 2 refills.    DATA  ABI (Date: 07/16/2018):  R:   ABI: 0.50 (was 0.44 on 01-08-18),   PT: mono  DP: mono  TBI:  0.13, toe pressure 24, (was 0.38)  L:   ABI: 0.60 (was 0.62),   PT: mono  DP: mono  TBI: 0.22, toe pressure 41, (was 0.46) Improved right ABI with moderate disease, stable left ABI with moderate disease, all monophasic waveforms.  Decline in bilateral TBI.    Plan  Continue extensive walking.   Refer to podiatrist in [place], if none in [place], then [place], to trim his toenails, and DM feet exams. Great toe of one foot has thick toenail.    Based on the patient's vascular studies  and examination, I have offered the patient:ABI's in 6 months.  I advised him to notify  us if he develops concerns re the circulation in his feet or legs.   The patient was again counseled re smoking cessation and given several free resources re smoking cessation at previous visits.   I discussed in depth with the patient the nature of atherosclerosis, and emphasized the importance of maximal medical management including strict control of blood pressure, blood glucose, and lipid levels, obtaining regular exercise, and cessation of smoking.  The patient is aware that without maximal medical management the underlying atherosclerotic disease process will progress, limiting the benefit of any interventions.  The patient was given information about PAD including signs, symptoms, treatment, what symptoms should prompt the patient to seek immediate medical care, and risk reduction measures to take.  Clemon Chambers, RN, MSN, FNP-C [facility] Phone: [phone]  Clinic MD: Trula Slade  07/16/18 12:16 PM

## 2018-07-16 NOTE — Patient Instructions (Signed)
Steps to Quit Smoking Smoking tobacco can be bad for your health. It can also affect almost every organ in your body. Smoking puts you and people around you at risk for many serious long-lasting (chronic) diseases. Quitting smoking is hard, but it is one of the best things that you can do for your health. It is never too late to quit. What are the benefits of quitting smoking? When you quit smoking, you lower your risk for getting serious diseases and conditions. They can include:  Lung cancer or lung disease.  Heart disease.  Stroke.  Heart attack.  Not being able to have children (infertility).  Weak bones (osteoporosis) and broken bones (fractures).  If you have coughing, wheezing, and shortness of breath, those symptoms may get better when you quit. You may also get sick less often. If you are pregnant, quitting smoking can help to lower your chances of having a baby of low birth weight. What can I do to help me quit smoking? Talk with your doctor about what can help you quit smoking. Some things you can do (strategies) include:  Quitting smoking totally, instead of slowly cutting back how much you smoke over a period of time.  Going to in-person counseling. You are more likely to quit if you go to many counseling sessions.  Using resources and support systems, such as: ? Online chats with a counselor. ? Phone quitlines. ? Printed self-help materials. ? Support groups or group counseling. ? Text messaging programs. ? Mobile phone apps or applications.  Taking medicines. Some of these medicines may have nicotine in them. If you are pregnant or breastfeeding, do not take any medicines to quit smoking unless your doctor says it is okay. Talk with your doctor about counseling or other things that can help you.  Talk with your doctor about using more than one strategy at the same time, such as taking medicines while you are also going to in-person counseling. This can help make  quitting easier. What things can I do to make it easier to quit? Quitting smoking might feel very hard at first, but there is a lot that you can do to make it easier. Take these steps:  Talk to your family and friends. Ask them to support and encourage you.  Call phone quitlines, reach out to support groups, or work with a counselor.  Ask people who smoke to not smoke around you.  Avoid places that make you want (trigger) to smoke, such as: ? Bars. ? Parties. ? Smoke-break areas at work.  Spend time with people who do not smoke.  Lower the stress in your life. Stress can make you want to smoke. Try these things to help your stress: ? Getting regular exercise. ? Deep-breathing exercises. ? Yoga. ? Meditating. ? Doing a body scan. To do this, close your eyes, focus on one area of your body at a time from head to toe, and notice which parts of your body are tense. Try to relax the muscles in those areas.  Download or buy apps on your mobile phone or tablet that can help you stick to your quit plan. There are many free apps, such as QuitGuide from the CDC (Centers for Disease Control and Prevention). You can find more support from smokefree.gov and other websites.  This information is not intended to replace advice given to you by your health care provider. Make sure you discuss any questions you have with your health care provider. Document Released: 06/25/2009 Document   Revised: 04/26/2016 Document Reviewed: 01/13/2015 Elsevier Interactive Patient Education  2018 [employer] Inc.     Peripheral Vascular Disease Peripheral vascular disease (PVD) is a disease of the blood vessels that are not part of your heart and brain. A simple term for PVD is poor circulation. In most cases, PVD narrows the blood vessels that carry blood from your heart to the rest of your body. This can result in a decreased supply of blood to your arms, legs, and internal organs, like your stomach or kidneys.  However, it most often affects a person's lower legs and feet. There are two types of PVD.  Organic PVD. This is the more common type. It is caused by damage to the structure of blood vessels.  Functional PVD. This is caused by conditions that make blood vessels contract and tighten (spasm).  Without treatment, PVD tends to get worse over time. PVD can also lead to acute ischemic limb. This is when an arm or limb suddenly has trouble getting enough blood. This is a medical emergency. Follow these instructions at home:  Take medicines only as told by your doctor.  Do not use any tobacco products, including cigarettes, chewing tobacco, or electronic cigarettes. If you need help quitting, ask your doctor.  Lose weight if you are overweight, and maintain a healthy weight as told by your doctor.  Eat a diet that is low in fat and cholesterol. If you need help, ask your doctor.  Exercise regularly. Ask your doctor for some good activities for you.  Take good care of your feet. ? Wear comfortable shoes that fit well. ? Check your feet often for any cuts or sores. Contact a doctor if:  You have cramps in your legs while walking.  You have leg pain when you are at rest.  You have coldness in a leg or foot.  Your skin changes.  You are unable to get or have an erection (erectile dysfunction).  You have cuts or sores on your feet that are not healing. Get help right away if:  Your arm or leg turns cold and blue.  Your arms or legs become red, warm, swollen, painful, or numb.  You have chest pain or trouble breathing.  You suddenly have weakness in your face, arm, or leg.  You become very confused or you cannot speak.  You suddenly have a very bad headache.  You suddenly cannot see. This information is not intended to replace advice given to you by your health care provider. Make sure you discuss any questions you have with your health care provider. Document Released:  11/23/2009 Document Revised: 02/04/2016 Document Reviewed: 02/06/2014 Elsevier Interactive Patient Education  2017 [employer] Inc.  

## 2018-10-05 DIAGNOSIS — F17210 Nicotine dependence, cigarettes, uncomplicated: Secondary | ICD-10-CM | POA: Diagnosis not present

## 2018-10-05 DIAGNOSIS — N529 Male erectile dysfunction, unspecified: Secondary | ICD-10-CM | POA: Diagnosis not present

## 2018-10-05 DIAGNOSIS — E1165 Type 2 diabetes mellitus with hyperglycemia: Secondary | ICD-10-CM | POA: Diagnosis not present

## 2018-10-05 DIAGNOSIS — N401 Enlarged prostate with lower urinary tract symptoms: Secondary | ICD-10-CM | POA: Diagnosis not present

## 2018-10-05 DIAGNOSIS — E1151 Type 2 diabetes mellitus with diabetic peripheral angiopathy without gangrene: Secondary | ICD-10-CM | POA: Diagnosis not present

## 2018-10-05 DIAGNOSIS — I10 Essential (primary) hypertension: Secondary | ICD-10-CM | POA: Diagnosis not present

## 2018-10-05 DIAGNOSIS — E782 Mixed hyperlipidemia: Secondary | ICD-10-CM | POA: Diagnosis not present

## 2018-10-05 DIAGNOSIS — E871 Hypo-osmolality and hyponatremia: Secondary | ICD-10-CM | POA: Diagnosis not present

## 2018-10-09 DIAGNOSIS — E782 Mixed hyperlipidemia: Secondary | ICD-10-CM | POA: Diagnosis not present

## 2018-10-09 DIAGNOSIS — I10 Essential (primary) hypertension: Secondary | ICD-10-CM | POA: Diagnosis not present

## 2018-10-09 DIAGNOSIS — F17210 Nicotine dependence, cigarettes, uncomplicated: Secondary | ICD-10-CM | POA: Diagnosis not present

## 2018-10-09 DIAGNOSIS — Z6824 Body mass index (BMI) 24.0-24.9, adult: Secondary | ICD-10-CM | POA: Diagnosis not present

## 2018-10-09 DIAGNOSIS — Z0001 Encounter for general adult medical examination with abnormal findings: Secondary | ICD-10-CM | POA: Diagnosis not present

## 2018-10-09 DIAGNOSIS — M4712 Other spondylosis with myelopathy, cervical region: Secondary | ICD-10-CM | POA: Diagnosis not present

## 2018-10-09 DIAGNOSIS — E871 Hypo-osmolality and hyponatremia: Secondary | ICD-10-CM | POA: Diagnosis not present

## 2018-10-09 DIAGNOSIS — E1151 Type 2 diabetes mellitus with diabetic peripheral angiopathy without gangrene: Secondary | ICD-10-CM | POA: Diagnosis not present

## 2018-12-31 ENCOUNTER — Other Ambulatory Visit: Payer: Self-pay

## 2018-12-31 DIAGNOSIS — I779 Disorder of arteries and arterioles, unspecified: Secondary | ICD-10-CM

## 2018-12-31 NOTE — Progress Notes (Signed)
ab 

## 2019-01-09 DIAGNOSIS — I10 Essential (primary) hypertension: Secondary | ICD-10-CM | POA: Diagnosis not present

## 2019-01-09 DIAGNOSIS — E782 Mixed hyperlipidemia: Secondary | ICD-10-CM | POA: Diagnosis not present

## 2019-01-09 DIAGNOSIS — E871 Hypo-osmolality and hyponatremia: Secondary | ICD-10-CM | POA: Diagnosis not present

## 2019-01-09 DIAGNOSIS — E1165 Type 2 diabetes mellitus with hyperglycemia: Secondary | ICD-10-CM | POA: Diagnosis not present

## 2019-01-14 ENCOUNTER — Encounter (HOSPITAL_COMMUNITY): Payer: Medicare Other

## 2019-01-14 ENCOUNTER — Ambulatory Visit: Payer: Medicare Other | Admitting: Family

## 2019-01-14 DIAGNOSIS — E1151 Type 2 diabetes mellitus with diabetic peripheral angiopathy without gangrene: Secondary | ICD-10-CM | POA: Diagnosis not present

## 2019-01-14 DIAGNOSIS — F17210 Nicotine dependence, cigarettes, uncomplicated: Secondary | ICD-10-CM | POA: Diagnosis not present

## 2019-01-14 DIAGNOSIS — Z0001 Encounter for general adult medical examination with abnormal findings: Secondary | ICD-10-CM | POA: Diagnosis not present

## 2019-01-14 DIAGNOSIS — I10 Essential (primary) hypertension: Secondary | ICD-10-CM | POA: Diagnosis not present

## 2019-01-14 DIAGNOSIS — Z6823 Body mass index (BMI) 23.0-23.9, adult: Secondary | ICD-10-CM | POA: Diagnosis not present

## 2019-01-14 DIAGNOSIS — E782 Mixed hyperlipidemia: Secondary | ICD-10-CM | POA: Diagnosis not present

## 2019-01-14 DIAGNOSIS — E871 Hypo-osmolality and hyponatremia: Secondary | ICD-10-CM | POA: Diagnosis not present

## 2019-01-14 DIAGNOSIS — E1169 Type 2 diabetes mellitus with other specified complication: Secondary | ICD-10-CM | POA: Diagnosis not present

## 2019-05-25 ENCOUNTER — Other Ambulatory Visit: Payer: Self-pay | Admitting: Surgery

## 2019-07-11 DIAGNOSIS — E1165 Type 2 diabetes mellitus with hyperglycemia: Secondary | ICD-10-CM | POA: Diagnosis not present

## 2019-07-11 DIAGNOSIS — E782 Mixed hyperlipidemia: Secondary | ICD-10-CM | POA: Diagnosis not present

## 2019-07-11 DIAGNOSIS — I10 Essential (primary) hypertension: Secondary | ICD-10-CM | POA: Diagnosis not present

## 2019-07-15 DIAGNOSIS — E871 Hypo-osmolality and hyponatremia: Secondary | ICD-10-CM | POA: Diagnosis not present

## 2019-07-15 DIAGNOSIS — E1151 Type 2 diabetes mellitus with diabetic peripheral angiopathy without gangrene: Secondary | ICD-10-CM | POA: Diagnosis not present

## 2019-07-15 DIAGNOSIS — I739 Peripheral vascular disease, unspecified: Secondary | ICD-10-CM | POA: Diagnosis not present

## 2019-07-15 DIAGNOSIS — F17210 Nicotine dependence, cigarettes, uncomplicated: Secondary | ICD-10-CM | POA: Diagnosis not present

## 2019-07-15 DIAGNOSIS — E1169 Type 2 diabetes mellitus with other specified complication: Secondary | ICD-10-CM | POA: Diagnosis not present

## 2019-07-15 DIAGNOSIS — I10 Essential (primary) hypertension: Secondary | ICD-10-CM | POA: Diagnosis not present

## 2019-07-15 DIAGNOSIS — E782 Mixed hyperlipidemia: Secondary | ICD-10-CM | POA: Diagnosis not present

## 2019-07-15 DIAGNOSIS — M4712 Other spondylosis with myelopathy, cervical region: Secondary | ICD-10-CM | POA: Diagnosis not present

## 2019-08-19 ENCOUNTER — Other Ambulatory Visit: Payer: Self-pay | Admitting: Vascular Surgery

## 2019-11-10 DIAGNOSIS — I10 Essential (primary) hypertension: Secondary | ICD-10-CM | POA: Diagnosis not present

## 2019-11-10 DIAGNOSIS — Z720 Tobacco use: Secondary | ICD-10-CM | POA: Diagnosis not present

## 2019-11-10 DIAGNOSIS — Z79899 Other long term (current) drug therapy: Secondary | ICD-10-CM | POA: Diagnosis not present

## 2019-11-10 DIAGNOSIS — R05 Cough: Secondary | ICD-10-CM | POA: Diagnosis not present

## 2019-11-10 DIAGNOSIS — J449 Chronic obstructive pulmonary disease, unspecified: Secondary | ICD-10-CM | POA: Diagnosis not present

## 2019-11-10 DIAGNOSIS — F17200 Nicotine dependence, unspecified, uncomplicated: Secondary | ICD-10-CM | POA: Diagnosis not present

## 2019-11-10 DIAGNOSIS — I739 Peripheral vascular disease, unspecified: Secondary | ICD-10-CM | POA: Diagnosis not present

## 2019-11-18 ENCOUNTER — Other Ambulatory Visit: Payer: Self-pay | Admitting: Vascular Surgery

## 2019-11-25 DIAGNOSIS — I739 Peripheral vascular disease, unspecified: Secondary | ICD-10-CM | POA: Diagnosis not present

## 2019-11-25 DIAGNOSIS — F17210 Nicotine dependence, cigarettes, uncomplicated: Secondary | ICD-10-CM | POA: Diagnosis not present

## 2019-11-25 DIAGNOSIS — E1169 Type 2 diabetes mellitus with other specified complication: Secondary | ICD-10-CM | POA: Diagnosis not present

## 2019-11-25 DIAGNOSIS — R05 Cough: Secondary | ICD-10-CM | POA: Diagnosis not present

## 2019-11-25 DIAGNOSIS — E871 Hypo-osmolality and hyponatremia: Secondary | ICD-10-CM | POA: Diagnosis not present

## 2019-11-25 DIAGNOSIS — E1151 Type 2 diabetes mellitus with diabetic peripheral angiopathy without gangrene: Secondary | ICD-10-CM | POA: Diagnosis not present

## 2019-11-28 DIAGNOSIS — R05 Cough: Secondary | ICD-10-CM | POA: Diagnosis not present

## 2019-11-28 DIAGNOSIS — I700 Atherosclerosis of aorta: Secondary | ICD-10-CM | POA: Diagnosis not present

## 2019-11-28 DIAGNOSIS — J432 Centrilobular emphysema: Secondary | ICD-10-CM | POA: Diagnosis not present

## 2019-11-28 DIAGNOSIS — J439 Emphysema, unspecified: Secondary | ICD-10-CM | POA: Diagnosis not present

## 2019-11-28 DIAGNOSIS — I2510 Atherosclerotic heart disease of native coronary artery without angina pectoris: Secondary | ICD-10-CM | POA: Diagnosis not present

## 2019-11-28 DIAGNOSIS — R918 Other nonspecific abnormal finding of lung field: Secondary | ICD-10-CM | POA: Diagnosis not present

## 2019-11-28 DIAGNOSIS — F17210 Nicotine dependence, cigarettes, uncomplicated: Secondary | ICD-10-CM | POA: Diagnosis not present

## 2020-01-16 DIAGNOSIS — E1169 Type 2 diabetes mellitus with other specified complication: Secondary | ICD-10-CM | POA: Diagnosis not present

## 2020-01-16 DIAGNOSIS — E871 Hypo-osmolality and hyponatremia: Secondary | ICD-10-CM | POA: Diagnosis not present

## 2020-01-16 DIAGNOSIS — I10 Essential (primary) hypertension: Secondary | ICD-10-CM | POA: Diagnosis not present

## 2020-01-16 DIAGNOSIS — E782 Mixed hyperlipidemia: Secondary | ICD-10-CM | POA: Diagnosis not present

## 2020-01-23 DIAGNOSIS — E782 Mixed hyperlipidemia: Secondary | ICD-10-CM | POA: Diagnosis not present

## 2020-01-23 DIAGNOSIS — E1151 Type 2 diabetes mellitus with diabetic peripheral angiopathy without gangrene: Secondary | ICD-10-CM | POA: Diagnosis not present

## 2020-01-23 DIAGNOSIS — Z6824 Body mass index (BMI) 24.0-24.9, adult: Secondary | ICD-10-CM | POA: Diagnosis not present

## 2020-01-23 DIAGNOSIS — Z0001 Encounter for general adult medical examination with abnormal findings: Secondary | ICD-10-CM | POA: Diagnosis not present

## 2020-01-23 DIAGNOSIS — J439 Emphysema, unspecified: Secondary | ICD-10-CM | POA: Diagnosis not present

## 2020-01-23 DIAGNOSIS — I2510 Atherosclerotic heart disease of native coronary artery without angina pectoris: Secondary | ICD-10-CM | POA: Diagnosis not present

## 2020-01-23 DIAGNOSIS — E1169 Type 2 diabetes mellitus with other specified complication: Secondary | ICD-10-CM | POA: Diagnosis not present

## 2020-01-23 DIAGNOSIS — I10 Essential (primary) hypertension: Secondary | ICD-10-CM | POA: Diagnosis not present

## 2020-02-26 ENCOUNTER — Other Ambulatory Visit: Payer: Self-pay

## 2020-02-26 DIAGNOSIS — I779 Disorder of arteries and arterioles, unspecified: Secondary | ICD-10-CM

## 2020-02-26 MED ORDER — CILOSTAZOL 100 MG PO TABS: 100.0000 mg | ORAL_TABLET | Freq: Two times a day (BID) | ORAL | 3 refills | Status: DC

## 2020-03-19 DIAGNOSIS — H2513 Age-related nuclear cataract, bilateral: Secondary | ICD-10-CM | POA: Diagnosis not present

## 2020-03-19 DIAGNOSIS — H40033 Anatomical narrow angle, bilateral: Secondary | ICD-10-CM | POA: Diagnosis not present

## 2020-04-16 DIAGNOSIS — E871 Hypo-osmolality and hyponatremia: Secondary | ICD-10-CM | POA: Diagnosis not present

## 2020-04-16 DIAGNOSIS — I10 Essential (primary) hypertension: Secondary | ICD-10-CM | POA: Diagnosis not present

## 2020-04-16 DIAGNOSIS — E1165 Type 2 diabetes mellitus with hyperglycemia: Secondary | ICD-10-CM | POA: Diagnosis not present

## 2020-04-16 DIAGNOSIS — E1169 Type 2 diabetes mellitus with other specified complication: Secondary | ICD-10-CM | POA: Diagnosis not present

## 2020-04-22 DIAGNOSIS — I2510 Atherosclerotic heart disease of native coronary artery without angina pectoris: Secondary | ICD-10-CM | POA: Diagnosis not present

## 2020-04-22 DIAGNOSIS — E1169 Type 2 diabetes mellitus with other specified complication: Secondary | ICD-10-CM | POA: Diagnosis not present

## 2020-04-22 DIAGNOSIS — Z6823 Body mass index (BMI) 23.0-23.9, adult: Secondary | ICD-10-CM | POA: Diagnosis not present

## 2020-04-22 DIAGNOSIS — J439 Emphysema, unspecified: Secondary | ICD-10-CM | POA: Diagnosis not present

## 2020-04-22 DIAGNOSIS — E782 Mixed hyperlipidemia: Secondary | ICD-10-CM | POA: Diagnosis not present

## 2020-04-22 DIAGNOSIS — I10 Essential (primary) hypertension: Secondary | ICD-10-CM | POA: Diagnosis not present

## 2020-04-22 DIAGNOSIS — R918 Other nonspecific abnormal finding of lung field: Secondary | ICD-10-CM | POA: Diagnosis not present

## 2020-04-22 DIAGNOSIS — E1151 Type 2 diabetes mellitus with diabetic peripheral angiopathy without gangrene: Secondary | ICD-10-CM | POA: Diagnosis not present

## 2020-07-15 DIAGNOSIS — F17210 Nicotine dependence, cigarettes, uncomplicated: Secondary | ICD-10-CM | POA: Diagnosis not present

## 2020-07-15 DIAGNOSIS — E871 Hypo-osmolality and hyponatremia: Secondary | ICD-10-CM | POA: Diagnosis not present

## 2020-07-15 DIAGNOSIS — Z1329 Encounter for screening for other suspected endocrine disorder: Secondary | ICD-10-CM | POA: Diagnosis not present

## 2020-07-15 DIAGNOSIS — E782 Mixed hyperlipidemia: Secondary | ICD-10-CM | POA: Diagnosis not present

## 2020-07-15 DIAGNOSIS — I10 Essential (primary) hypertension: Secondary | ICD-10-CM | POA: Diagnosis not present

## 2020-07-15 DIAGNOSIS — E1169 Type 2 diabetes mellitus with other specified complication: Secondary | ICD-10-CM | POA: Diagnosis not present

## 2020-07-20 DIAGNOSIS — E782 Mixed hyperlipidemia: Secondary | ICD-10-CM | POA: Diagnosis not present

## 2020-07-20 DIAGNOSIS — Z23 Encounter for immunization: Secondary | ICD-10-CM | POA: Diagnosis not present

## 2020-07-20 DIAGNOSIS — E1169 Type 2 diabetes mellitus with other specified complication: Secondary | ICD-10-CM | POA: Diagnosis not present

## 2020-07-20 DIAGNOSIS — M4712 Other spondylosis with myelopathy, cervical region: Secondary | ICD-10-CM | POA: Diagnosis not present

## 2020-07-20 DIAGNOSIS — I739 Peripheral vascular disease, unspecified: Secondary | ICD-10-CM | POA: Diagnosis not present

## 2020-07-20 DIAGNOSIS — I10 Essential (primary) hypertension: Secondary | ICD-10-CM | POA: Diagnosis not present

## 2020-07-20 DIAGNOSIS — E871 Hypo-osmolality and hyponatremia: Secondary | ICD-10-CM | POA: Diagnosis not present

## 2020-07-20 DIAGNOSIS — Z6824 Body mass index (BMI) 24.0-24.9, adult: Secondary | ICD-10-CM | POA: Diagnosis not present

## 2020-07-20 DIAGNOSIS — E1151 Type 2 diabetes mellitus with diabetic peripheral angiopathy without gangrene: Secondary | ICD-10-CM | POA: Diagnosis not present

## 2020-07-20 DIAGNOSIS — I700 Atherosclerosis of aorta: Secondary | ICD-10-CM | POA: Diagnosis not present

## 2020-07-20 DIAGNOSIS — J439 Emphysema, unspecified: Secondary | ICD-10-CM | POA: Diagnosis not present

## 2020-07-27 DIAGNOSIS — Z6824 Body mass index (BMI) 24.0-24.9, adult: Secondary | ICD-10-CM | POA: Diagnosis not present

## 2020-07-27 DIAGNOSIS — K1120 Sialoadenitis, unspecified: Secondary | ICD-10-CM | POA: Diagnosis not present

## 2020-09-07 DIAGNOSIS — Z6824 Body mass index (BMI) 24.0-24.9, adult: Secondary | ICD-10-CM | POA: Diagnosis not present

## 2020-09-07 DIAGNOSIS — H9192 Unspecified hearing loss, left ear: Secondary | ICD-10-CM | POA: Diagnosis not present

## 2020-09-12 DIAGNOSIS — J449 Chronic obstructive pulmonary disease, unspecified: Secondary | ICD-10-CM

## 2020-09-12 HISTORY — DX: Chronic obstructive pulmonary disease, unspecified: J44.9

## 2020-09-24 DIAGNOSIS — R059 Cough, unspecified: Secondary | ICD-10-CM | POA: Diagnosis not present

## 2020-09-24 DIAGNOSIS — Z20828 Contact with and (suspected) exposure to other viral communicable diseases: Secondary | ICD-10-CM | POA: Diagnosis not present

## 2020-09-24 DIAGNOSIS — R6883 Chills (without fever): Secondary | ICD-10-CM | POA: Diagnosis not present

## 2020-10-22 DIAGNOSIS — Z6822 Body mass index (BMI) 22.0-22.9, adult: Secondary | ICD-10-CM | POA: Diagnosis not present

## 2020-10-22 DIAGNOSIS — I10 Essential (primary) hypertension: Secondary | ICD-10-CM | POA: Diagnosis not present

## 2020-10-22 DIAGNOSIS — E1151 Type 2 diabetes mellitus with diabetic peripheral angiopathy without gangrene: Secondary | ICD-10-CM | POA: Diagnosis not present

## 2020-10-22 DIAGNOSIS — E871 Hypo-osmolality and hyponatremia: Secondary | ICD-10-CM | POA: Diagnosis not present

## 2020-10-22 DIAGNOSIS — R0989 Other specified symptoms and signs involving the circulatory and respiratory systems: Secondary | ICD-10-CM | POA: Diagnosis not present

## 2020-10-22 DIAGNOSIS — R2681 Unsteadiness on feet: Secondary | ICD-10-CM | POA: Diagnosis not present

## 2020-10-22 DIAGNOSIS — R42 Dizziness and giddiness: Secondary | ICD-10-CM | POA: Diagnosis not present

## 2020-11-05 DIAGNOSIS — I340 Nonrheumatic mitral (valve) insufficiency: Secondary | ICD-10-CM | POA: Diagnosis not present

## 2020-11-05 DIAGNOSIS — I081 Rheumatic disorders of both mitral and tricuspid valves: Secondary | ICD-10-CM | POA: Diagnosis not present

## 2020-11-09 DIAGNOSIS — R42 Dizziness and giddiness: Secondary | ICD-10-CM | POA: Diagnosis not present

## 2020-11-09 DIAGNOSIS — E785 Hyperlipidemia, unspecified: Secondary | ICD-10-CM | POA: Diagnosis not present

## 2020-11-09 DIAGNOSIS — I6523 Occlusion and stenosis of bilateral carotid arteries: Secondary | ICD-10-CM | POA: Diagnosis not present

## 2020-11-09 DIAGNOSIS — I10 Essential (primary) hypertension: Secondary | ICD-10-CM | POA: Diagnosis not present

## 2020-11-12 DIAGNOSIS — E1151 Type 2 diabetes mellitus with diabetic peripheral angiopathy without gangrene: Secondary | ICD-10-CM | POA: Diagnosis not present

## 2020-11-12 DIAGNOSIS — I10 Essential (primary) hypertension: Secondary | ICD-10-CM | POA: Diagnosis not present

## 2020-11-12 DIAGNOSIS — R0989 Other specified symptoms and signs involving the circulatory and respiratory systems: Secondary | ICD-10-CM | POA: Diagnosis not present

## 2020-11-12 DIAGNOSIS — E871 Hypo-osmolality and hyponatremia: Secondary | ICD-10-CM | POA: Diagnosis not present

## 2020-11-12 DIAGNOSIS — I6529 Occlusion and stenosis of unspecified carotid artery: Secondary | ICD-10-CM | POA: Diagnosis not present

## 2020-11-12 DIAGNOSIS — Z6823 Body mass index (BMI) 23.0-23.9, adult: Secondary | ICD-10-CM | POA: Diagnosis not present

## 2020-11-12 DIAGNOSIS — R2681 Unsteadiness on feet: Secondary | ICD-10-CM | POA: Diagnosis not present

## 2020-11-12 DIAGNOSIS — R42 Dizziness and giddiness: Secondary | ICD-10-CM | POA: Diagnosis not present

## 2020-11-16 ENCOUNTER — Ambulatory Visit (INDEPENDENT_AMBULATORY_CARE_PROVIDER_SITE_OTHER): Payer: Medicare Other | Admitting: Vascular Surgery

## 2020-11-16 ENCOUNTER — Other Ambulatory Visit: Payer: Self-pay

## 2020-11-16 ENCOUNTER — Encounter: Payer: Self-pay | Admitting: Vascular Surgery

## 2020-11-16 VITALS — BP 167/81 | HR 80 | Temp 98.6°F | Resp 20 | Ht 73.0 in | Wt 174.4 lb

## 2020-11-16 DIAGNOSIS — I779 Disorder of arteries and arterioles, unspecified: Secondary | ICD-10-CM
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
## 2020-11-18 ENCOUNTER — Other Ambulatory Visit: Payer: Self-pay

## 2020-11-18 DIAGNOSIS — I6529 Occlusion and stenosis of unspecified carotid artery: Secondary | ICD-10-CM

## 2020-11-18 DIAGNOSIS — I779 Disorder of arteries and arterioles, unspecified: Secondary | ICD-10-CM

## 2020-12-15 DIAGNOSIS — E1151 Type 2 diabetes mellitus with diabetic peripheral angiopathy without gangrene: Secondary | ICD-10-CM | POA: Diagnosis not present

## 2020-12-15 DIAGNOSIS — E871 Hypo-osmolality and hyponatremia: Secondary | ICD-10-CM | POA: Diagnosis not present

## 2020-12-15 DIAGNOSIS — R2681 Unsteadiness on feet: Secondary | ICD-10-CM | POA: Diagnosis not present

## 2020-12-15 DIAGNOSIS — I10 Essential (primary) hypertension: Secondary | ICD-10-CM | POA: Diagnosis not present

## 2020-12-15 DIAGNOSIS — R0989 Other specified symptoms and signs involving the circulatory and respiratory systems: Secondary | ICD-10-CM | POA: Diagnosis not present

## 2020-12-15 DIAGNOSIS — Z6822 Body mass index (BMI) 22.0-22.9, adult: Secondary | ICD-10-CM | POA: Diagnosis not present

## 2020-12-15 DIAGNOSIS — R42 Dizziness and giddiness: Secondary | ICD-10-CM | POA: Diagnosis not present

## 2020-12-15 DIAGNOSIS — I6529 Occlusion and stenosis of unspecified carotid artery: Secondary | ICD-10-CM | POA: Diagnosis not present

## 2020-12-31 DIAGNOSIS — J209 Acute bronchitis, unspecified: Secondary | ICD-10-CM | POA: Diagnosis not present

## 2021-01-21 DIAGNOSIS — I10 Essential (primary) hypertension: Secondary | ICD-10-CM | POA: Diagnosis not present

## 2021-01-21 DIAGNOSIS — E7849 Other hyperlipidemia: Secondary | ICD-10-CM | POA: Diagnosis not present

## 2021-01-21 DIAGNOSIS — E7800 Pure hypercholesterolemia, unspecified: Secondary | ICD-10-CM | POA: Diagnosis not present

## 2021-01-21 DIAGNOSIS — E1169 Type 2 diabetes mellitus with other specified complication: Secondary | ICD-10-CM | POA: Diagnosis not present

## 2021-01-21 DIAGNOSIS — Z125 Encounter for screening for malignant neoplasm of prostate: Secondary | ICD-10-CM | POA: Diagnosis not present

## 2021-01-21 DIAGNOSIS — E782 Mixed hyperlipidemia: Secondary | ICD-10-CM | POA: Diagnosis not present

## 2021-01-25 DIAGNOSIS — J439 Emphysema, unspecified: Secondary | ICD-10-CM | POA: Diagnosis not present

## 2021-01-25 DIAGNOSIS — E871 Hypo-osmolality and hyponatremia: Secondary | ICD-10-CM | POA: Diagnosis not present

## 2021-01-25 DIAGNOSIS — Z23 Encounter for immunization: Secondary | ICD-10-CM | POA: Diagnosis not present

## 2021-01-25 DIAGNOSIS — I700 Atherosclerosis of aorta: Secondary | ICD-10-CM | POA: Diagnosis not present

## 2021-01-25 DIAGNOSIS — E7849 Other hyperlipidemia: Secondary | ICD-10-CM | POA: Diagnosis not present

## 2021-01-25 DIAGNOSIS — E1151 Type 2 diabetes mellitus with diabetic peripheral angiopathy without gangrene: Secondary | ICD-10-CM | POA: Diagnosis not present

## 2021-01-25 DIAGNOSIS — I10 Essential (primary) hypertension: Secondary | ICD-10-CM | POA: Diagnosis not present

## 2021-01-25 DIAGNOSIS — Z0001 Encounter for general adult medical examination with abnormal findings: Secondary | ICD-10-CM | POA: Diagnosis not present

## 2021-02-01 DIAGNOSIS — E871 Hypo-osmolality and hyponatremia: Secondary | ICD-10-CM | POA: Diagnosis not present

## 2021-02-09 DIAGNOSIS — E1169 Type 2 diabetes mellitus with other specified complication: Secondary | ICD-10-CM | POA: Diagnosis not present

## 2021-02-09 DIAGNOSIS — I10 Essential (primary) hypertension: Secondary | ICD-10-CM | POA: Diagnosis not present

## 2021-02-09 DIAGNOSIS — E871 Hypo-osmolality and hyponatremia: Secondary | ICD-10-CM | POA: Diagnosis not present

## 2021-02-17 ENCOUNTER — Other Ambulatory Visit: Payer: Self-pay

## 2021-02-17 DIAGNOSIS — I779 Disorder of arteries and arterioles, unspecified: Secondary | ICD-10-CM

## 2021-02-17 MED ORDER — CILOSTAZOL 100 MG PO TABS: 100.0000 mg | ORAL_TABLET | Freq: Two times a day (BID) | ORAL | 3 refills | Status: DC

## 2021-02-18 ENCOUNTER — Other Ambulatory Visit: Payer: Self-pay | Admitting: Surgery

## 2021-02-18 DIAGNOSIS — I779 Disorder of arteries and arterioles, unspecified: Secondary | ICD-10-CM

## 2021-02-19 ENCOUNTER — Other Ambulatory Visit: Payer: Self-pay

## 2021-02-23 DIAGNOSIS — E871 Hypo-osmolality and hyponatremia: Secondary | ICD-10-CM | POA: Diagnosis not present

## 2021-02-23 DIAGNOSIS — I10 Essential (primary) hypertension: Secondary | ICD-10-CM | POA: Diagnosis not present

## 2021-02-23 DIAGNOSIS — E1169 Type 2 diabetes mellitus with other specified complication: Secondary | ICD-10-CM | POA: Diagnosis not present

## 2021-03-05 DIAGNOSIS — F17210 Nicotine dependence, cigarettes, uncomplicated: Secondary | ICD-10-CM | POA: Diagnosis not present

## 2021-03-05 DIAGNOSIS — Z23 Encounter for immunization: Secondary | ICD-10-CM | POA: Diagnosis not present

## 2021-04-13 DIAGNOSIS — I10 Essential (primary) hypertension: Secondary | ICD-10-CM | POA: Diagnosis not present

## 2021-04-13 DIAGNOSIS — G4733 Obstructive sleep apnea (adult) (pediatric): Secondary | ICD-10-CM | POA: Diagnosis not present

## 2021-04-16 DIAGNOSIS — E1169 Type 2 diabetes mellitus with other specified complication: Secondary | ICD-10-CM | POA: Diagnosis not present

## 2021-04-16 DIAGNOSIS — E7800 Pure hypercholesterolemia, unspecified: Secondary | ICD-10-CM | POA: Diagnosis not present

## 2021-04-16 DIAGNOSIS — E871 Hypo-osmolality and hyponatremia: Secondary | ICD-10-CM | POA: Diagnosis not present

## 2021-04-16 DIAGNOSIS — I10 Essential (primary) hypertension: Secondary | ICD-10-CM | POA: Diagnosis not present

## 2021-04-16 DIAGNOSIS — E7801 Familial hypercholesterolemia: Secondary | ICD-10-CM | POA: Diagnosis not present

## 2021-04-16 DIAGNOSIS — E782 Mixed hyperlipidemia: Secondary | ICD-10-CM | POA: Diagnosis not present

## 2021-04-16 DIAGNOSIS — Z1329 Encounter for screening for other suspected endocrine disorder: Secondary | ICD-10-CM | POA: Diagnosis not present

## 2021-04-16 DIAGNOSIS — E7849 Other hyperlipidemia: Secondary | ICD-10-CM | POA: Diagnosis not present

## 2021-04-21 DIAGNOSIS — I10 Essential (primary) hypertension: Secondary | ICD-10-CM | POA: Diagnosis not present

## 2021-04-21 DIAGNOSIS — E871 Hypo-osmolality and hyponatremia: Secondary | ICD-10-CM | POA: Diagnosis not present

## 2021-04-21 DIAGNOSIS — Z1389 Encounter for screening for other disorder: Secondary | ICD-10-CM | POA: Diagnosis not present

## 2021-04-21 DIAGNOSIS — I2510 Atherosclerotic heart disease of native coronary artery without angina pectoris: Secondary | ICD-10-CM | POA: Diagnosis not present

## 2021-04-21 DIAGNOSIS — J439 Emphysema, unspecified: Secondary | ICD-10-CM | POA: Diagnosis not present

## 2021-04-21 DIAGNOSIS — Z1331 Encounter for screening for depression: Secondary | ICD-10-CM | POA: Diagnosis not present

## 2021-04-21 DIAGNOSIS — D649 Anemia, unspecified: Secondary | ICD-10-CM | POA: Diagnosis not present

## 2021-04-21 DIAGNOSIS — E1151 Type 2 diabetes mellitus with diabetic peripheral angiopathy without gangrene: Secondary | ICD-10-CM | POA: Diagnosis not present

## 2021-05-19 ENCOUNTER — Ambulatory Visit (HOSPITAL_COMMUNITY)
Admission: RE | Admit: 2021-05-19 | Discharge: 2021-05-19 | Disposition: A | Discharge status: Home / Self Care | Payer: Medicare Other | Source: Ambulatory Visit | Attending: Vascular Surgery | Admitting: Vascular Surgery

## 2021-05-19 ENCOUNTER — Ambulatory Visit (INDEPENDENT_AMBULATORY_CARE_PROVIDER_SITE_OTHER)
Admission: RE | Admit: 2021-05-19 | Discharge: 2021-05-19 | Disposition: A | Discharge status: Home / Self Care | Payer: Medicare Other | Source: Ambulatory Visit | Attending: Vascular Surgery | Admitting: Vascular Surgery

## 2021-05-19 ENCOUNTER — Other Ambulatory Visit: Payer: Self-pay

## 2021-05-19 ENCOUNTER — Ambulatory Visit (INDEPENDENT_AMBULATORY_CARE_PROVIDER_SITE_OTHER): Payer: Medicare Other | Admitting: Vascular Surgery

## 2021-05-19 ENCOUNTER — Encounter: Payer: Self-pay | Admitting: Vascular Surgery

## 2021-05-19 VITALS — BP 206/82 | HR 91 | Temp 98.0°F | Resp 20 | Ht 73.0 in | Wt 171.0 lb

## 2021-05-19 DIAGNOSIS — I6529 Occlusion and stenosis of unspecified carotid artery: Secondary | ICD-10-CM | POA: Diagnosis not present

## 2021-05-19 DIAGNOSIS — I779 Disorder of arteries and arterioles, unspecified: Secondary | ICD-10-CM | POA: Diagnosis not present

## 2021-05-19 DIAGNOSIS — I70213 Atherosclerosis of native arteries of extremities with intermittent claudication, bilateral legs: Secondary | ICD-10-CM

## 2021-05-19 NOTE — Progress Notes (Signed)
Patient ID: Raymond [name], male   DOB: 09/07/1961, 60 y.o.   MRN: [id number]  Reason for Consult: Follow-up   Referred by Curlene Labrum, MD  Subjective:     HPI:  Raymond [name] is a 60 y.o. male here for his 12-month follow-up to evaluate claudication and right ICA stenosis.  Initially our study had incongruency on the read and the images of the side of carotid stenosis and he was noted to have a right-sided carotid bruit.  He does not have any stroke, TIA or amaurosis.  As far as his lower extremity disease he has had cramping after walking in the mall this appears to be after several 100 yards and this did resolve with rest.  He does not have any tissue loss, ulceration or rest pain.  He does take aspirin he is on no other blood thinners or antiplatelet agents.  He is also on a statin.  He is a current every day smoker.  Past Medical History:  Diagnosis Date   Cancer ([place])    melanoma   Carotid artery occlusion    Cervical spondylosis with myelopathy    Arthritis   Diabetes mellitus    Type II   Hyperlipidemia    Hypertension    Impotence of organic origin    Insomnia    Peripheral arterial disease ([facility])    Restless leg syndrome    Family History  Problem Relation Age of Onset   Cancer Mother    Hypertension Father    Hypertension Sister    Past Surgical History:  Procedure Laterality Date   spinal fusion  2000   SPINE SURGERY  2000   Cervical Myelopathy- S/P fusion    Short Social History:  Social History   Tobacco Use   Smoking status: Every Day    Packs/day: 1.00    Types: Cigarettes   Smokeless tobacco: Never   Tobacco comments:    smokes about 3 cigs in last month  Substance Use Topics   Alcohol use: Yes    Alcohol/week: 2.0 standard drinks    Types: 2 Cans of beer per week    Comment: 2 cans per month    No Known Allergies  Current Outpatient Medications  Medication Sig Dispense Refill   acetaminophen (TYLENOL) 100 MG/ML solution Take 10  mg/kg by mouth every 4 (four) hours as needed.     amLODipine (NORVASC) 10 MG tablet Take 10 mg by mouth daily.     aspirin 81 MG tablet Take 81 mg by mouth daily.     cilostazol (PLETAL) 100 MG tablet Take 1 tablet (100 mg total) by mouth 2 (two) times daily before a meal. 180 tablet 3   lisinopril (PRINIVIL,ZESTRIL) 40 MG tablet Take 40 mg by mouth 2 (two) times daily.      metFORMIN (GLUCOPHAGE) 1000 MG tablet      metoprolol succinate (TOPROL-XL) 50 MG 24 hr tablet      pravastatin (PRAVACHOL) 40 MG tablet      traZODone (DESYREL) 100 MG tablet Take 100 mg by mouth at bedtime.     No current facility-administered medications for this visit.    Review of Systems  Constitutional:  Constitutional negative. HENT: HENT negative.  Eyes: Eyes negative.  Cardiovascular: Positive for claudication.  GI: Gastrointestinal negative.  Musculoskeletal: Musculoskeletal negative.  Skin: Skin negative.  Neurological: Neurological negative. Hematologic: Hematologic/lymphatic negative.  Psychiatric: Psychiatric negative.       Objective:  Objective   Vitals:  05/19/21 1408 05/19/21 1409  BP: (!) 212/81 (!) 206/82  Pulse: 91   Resp: 20   Temp: 98 F (36.7 C)   SpO2: 96%   Weight: 171 lb (77.6 kg)   Height: 6\' 1"  (1.854 m)    Body mass index is 22.56 kg/m.  Physical Exam HENT:     Head: Normocephalic.     Nose:     Comments: Wearing a mask Neck:     Vascular: Carotid bruit present.     Comments: There is limitation to mobility from previous neck surgery Cardiovascular:     Comments: I cannot reliably feel femoral pulses Abdominal:     General: Abdomen is flat.     Palpations: There is mass.  Musculoskeletal:        General: Normal range of motion.     Cervical back: Neck supple.  Skin:    General: Skin is warm and dry.  Neurological:     General: No focal deficit present.     Mental Status: He is alert.  Psychiatric:        Mood and Affect: Mood normal.        Thought  Content: Thought content normal.        Judgment: Judgment normal.    Data:    Right Carotid Findings:  +----------+--------+--------+--------+------------------+--------+            PSV cm/sEDV cm/sStenosisPlaque DescriptionComments  +----------+--------+--------+--------+------------------+--------+  CCA Prox  116     9                                           +----------+--------+--------+--------+------------------+--------+  CCA Distal137     23                                          +----------+--------+--------+--------+------------------+--------+  ICA Prox  543     129     80-99%  heterogenous                +----------+--------+--------+--------+------------------+--------+  ICA Mid   329     71                                          +----------+--------+--------+--------+------------------+--------+  ICA Distal99      18                                          +----------+--------+--------+--------+------------------+--------+  ECA       175     20              heterogenous                +----------+--------+--------+--------+------------------+--------+   +----------+--------+-------+--------+-------------------+            PSV cm/sEDV cmsDescribeArm Pressure (mmHG)  +----------+--------+-------+--------+-------------------+  [id number]            Stenotic                     +----------+--------+-------+--------+-------------------+   +---------+--------+---+--------+--+------------------------------------+  VertebralPSV cm/s309EDV cm/s71Stenotic- mid cervical and Antegrade  +---------+--------+---+--------+--+------------------------------------+  Left Carotid Findings:  +----------+--------+--------+--------+------------------+--------+            PSV cm/sEDV cm/sStenosisPlaque DescriptionComments  +----------+--------+--------+--------+------------------+--------+  CCA  Prox  172     26                                          +----------+--------+--------+--------+------------------+--------+  CCA Distal183     36                                          +----------+--------+--------+--------+------------------+--------+  ICA Prox  203     36      40-59%  heterogenous                +----------+--------+--------+--------+------------------+--------+  ICA Mid   156     31                                          +----------+--------+--------+--------+------------------+--------+  ICA Distal113     25                                          +----------+--------+--------+--------+------------------+--------+  ECA       212     27                                          +----------+--------+--------+--------+------------------+--------+   +----------+--------+--------+----------------+-------------------+            PSV cm/sEDV cm/sDescribe        Arm Pressure (mmHG)  +----------+--------+--------+----------------+-------------------+  [id number]             Multiphasic, WNL                     +----------+--------+--------+----------------+-------------------+   +---------+--------+--+--------+--+---------+  VertebralPSV cm/s99EDV cm/s32Antegrade  +---------+--------+--+--------+--+---------+           Summary:  Right Carotid: Velocities in the right ICA are consistent with a 80-99%                 stenosis.   Left Carotid: Velocities in the left ICA are consistent with a 40-59%  stenosis.   Vertebrals:  Right Vertebral stenotic. Left Vertebral normal limits.  Subclavians: Right subclavian artery was stenotic. Normal flow  hemodynamics were               seen in the left subclavian artery.   ABI Findings:  +---------+------------------+-----+----------+--------+  Right    Rt Pressure (mmHg)IndexWaveform  Comment   +---------+------------------+-----+----------+--------+   Brachial 196                                        +---------+------------------+-----+----------+--------+  PTA      83                0.42 monophasic          +---------+------------------+-----+----------+--------+  DP       91  0.46 monophasic          +---------+------------------+-----+----------+--------+  Great Toe47                0.24 Abnormal            +---------+------------------+-----+----------+--------+   +---------+------------------+-----+----------+-------+  Left     Lt Pressure (mmHg)IndexWaveform  Comment  +---------+------------------+-----+----------+-------+  Brachial 198                                       +---------+------------------+-----+----------+-------+  PTA      105               0.53 monophasic         +---------+------------------+-----+----------+-------+  DP       105               0.53 monophasic         +---------+------------------+-----+----------+-------+  Great Toe86                0.43 Abnormal           +---------+------------------+-----+----------+-------+   +-------+-----------+-----------+------------+------------+  ABI/TBIToday's ABIToday's TBIPrevious ABIPrevious TBI  +-------+-----------+-----------+------------+------------+  Right  0.46       0.24       0.5         0.13          +-------+-----------+-----------+------------+------------+  Left   0.53       0.43       0.6         0.22          +-------+-----------+-----------+------------+------------+        Assessment/Plan:     60 year old male here for evaluation of claudication and right carotid artery stenosis.  From a carotid artery standpoint he remains asymptomatic.  He does have previous neck fusion surgery which would limit our ability to get high exposure so I discussed with him his options being carotid endarterectomy, transcribed artery stenting, best medical therapy with  adding Plavix.  We are going to get a CT angio to evaluate him for transcribed artery stenting.  I will see him back in 3 to 4 weeks from the standpoint.  From a lower extremity standpoint he has claudication no chronic limb threatening ischemia at this time.  I discussed with him the urgent need for smoking cessation and to continue walking as tolerated.  Patient demonstrates good understanding of all of the above we will get a CT scheduled and have him back in a few weeks for evaluation.  We discussed the signs and symptoms of stroke and he demonstrates good understanding.    Waynetta Sandy MD [facility]

## 2021-05-20 ENCOUNTER — Other Ambulatory Visit: Payer: Self-pay | Admitting: *Deleted

## 2021-05-20 DIAGNOSIS — I6523 Occlusion and stenosis of bilateral carotid arteries: Secondary | ICD-10-CM

## 2021-06-11 ENCOUNTER — Ambulatory Visit (HOSPITAL_COMMUNITY)
Admission: RE | Admit: 2021-06-11 | Discharge: 2021-06-11 | Disposition: A | Discharge status: Home / Self Care | Payer: Medicare Other | Source: Ambulatory Visit | Attending: Vascular Surgery | Admitting: Vascular Surgery

## 2021-06-11 ENCOUNTER — Other Ambulatory Visit: Payer: Self-pay

## 2021-06-11 DIAGNOSIS — I672 Cerebral atherosclerosis: Secondary | ICD-10-CM | POA: Diagnosis not present

## 2021-06-11 DIAGNOSIS — I6523 Occlusion and stenosis of bilateral carotid arteries: Secondary | ICD-10-CM | POA: Insufficient documentation

## 2021-06-11 DIAGNOSIS — M4312 Spondylolisthesis, cervical region: Secondary | ICD-10-CM | POA: Diagnosis not present

## 2021-06-11 DIAGNOSIS — Q283 Other malformations of cerebral vessels: Secondary | ICD-10-CM | POA: Diagnosis not present

## 2021-06-11 DIAGNOSIS — I6603 Occlusion and stenosis of bilateral middle cerebral arteries: Secondary | ICD-10-CM | POA: Diagnosis not present

## 2021-06-11 DIAGNOSIS — I6503 Occlusion and stenosis of bilateral vertebral arteries: Secondary | ICD-10-CM | POA: Diagnosis not present

## 2021-06-11 DIAGNOSIS — I63233 Cerebral infarction due to unspecified occlusion or stenosis of bilateral carotid arteries: Secondary | ICD-10-CM | POA: Diagnosis not present

## 2021-06-11 LAB — POCT I-STAT CREATININE: Creatinine, Ser: 1.9 mg/dL — ABNORMAL HIGH (ref 0.61–1.24)

## 2021-06-11 MED ORDER — IOHEXOL 350 MG/ML SOLN
100.0000 mL | Freq: Once | INTRAVENOUS | Status: AC | PRN
  Administered 2021-06-11: 60 mL via INTRAVENOUS

## 2021-06-16 ENCOUNTER — Other Ambulatory Visit: Payer: Self-pay

## 2021-06-16 ENCOUNTER — Ambulatory Visit (INDEPENDENT_AMBULATORY_CARE_PROVIDER_SITE_OTHER): Payer: Medicare Other | Admitting: Vascular Surgery

## 2021-06-16 ENCOUNTER — Encounter: Payer: Self-pay | Admitting: Vascular Surgery

## 2021-06-16 VITALS — BP 193/80 | HR 79 | Temp 98.7°F | Resp 20 | Ht 73.0 in | Wt 170.0 lb

## 2021-06-16 DIAGNOSIS — I6523 Occlusion and stenosis of bilateral carotid arteries: Secondary | ICD-10-CM

## 2021-06-16 DIAGNOSIS — I779 Disorder of arteries and arterioles, unspecified: Secondary | ICD-10-CM | POA: Diagnosis not present

## 2021-06-16 DIAGNOSIS — Z419 Encounter for procedure for purposes other than remedying health state, unspecified: Secondary | ICD-10-CM

## 2021-06-16 MED ORDER — CLOPIDOGREL BISULFATE 75 MG PO TABS: 75.0000 mg | ORAL_TABLET | Freq: Every day | ORAL | 6 refills | Status: DC

## 2021-06-16 NOTE — Progress Notes (Signed)
Patient ID: Raymond [name], male   DOB: 17-Apr-1961, 60 y.o.   MRN: [id number]  Reason for Consult: Follow-up   Referred by Curlene Labrum, MD  Subjective:     HPI:  Raymond [name] is a 60 y.o. male history of claudication and right ICA stenosis.  Continues to deny stroke, TIA or amaurosis.  He is able to walk as far as he wants.  He is on aspirin and a statin he does not take other antiplatelet agents or blood thinners.  He denies any tissue loss or ulceration in his feet.  He continues to smoke daily.  He is here with CT scan to evaluate for carotid stenosis on the right.  Past Medical History:  Diagnosis Date  . Cancer ([place])    melanoma  . Carotid artery occlusion   . Cervical spondylosis with myelopathy    Arthritis  . Diabetes mellitus    Type II  . Hyperlipidemia   . Hypertension   . Impotence of organic origin   . Insomnia   . Peripheral arterial disease ([place])   . Restless leg syndrome    Family History  Problem Relation Age of Onset  . Cancer Mother   . Hypertension Father   . Hypertension Sister    Past Surgical History:  Procedure Laterality Date  . spinal fusion  2000  . SPINE SURGERY  2000   Cervical Myelopathy- S/P fusion    Short Social History:  Social History   Tobacco Use  . Smoking status: Every Day    Packs/day: 1.00    Types: Cigarettes  . Smokeless tobacco: Never  . Tobacco comments:    smokes about 3 cigs in last month  Substance Use Topics  . Alcohol use: Yes    Alcohol/week: 2.0 standard drinks    Types: 2 Cans of beer per week    Comment: 2 cans per month    No Known Allergies  Current Outpatient Medications  Medication Sig Dispense Refill  . acetaminophen (TYLENOL) 100 MG/ML solution Take 10 mg/kg by mouth every 4 (four) hours as needed.    Marland Kitchen amLODipine (NORVASC) 10 MG tablet Take 10 mg by mouth daily.    Marland Kitchen aspirin 81 MG tablet Take 81 mg by mouth daily.    . cilostazol (PLETAL) 100 MG tablet Take 1 tablet (100 mg  total) by mouth 2 (two) times daily before a meal. 180 tablet 3  . fluticasone (FLONASE) 50 MCG/ACT nasal spray     . lisinopril (PRINIVIL,ZESTRIL) 40 MG tablet Take 40 mg by mouth 2 (two) times daily.     . metFORMIN (GLUCOPHAGE) 1000 MG tablet     . metoprolol succinate (TOPROL-XL) 50 MG 24 hr tablet     . pravastatin (PRAVACHOL) 40 MG tablet     . SPIRIVA HANDIHALER 18 MCG inhalation capsule     . traZODone (DESYREL) 100 MG tablet Take 100 mg by mouth at bedtime.     No current facility-administered medications for this visit.    Review of Systems  Constitutional:  Constitutional negative. HENT: HENT negative.  Eyes: Eyes negative.  Cardiovascular: Positive for claudication.  GI: Gastrointestinal negative.  Skin: Skin negative.  Neurological: Neurological negative. Hematologic: Positive for bruises/bleeds easily.  Psychiatric: Psychiatric negative.       Objective:  Objective   Vitals:   06/16/21 1049 06/16/21 1051  BP: (!) 192/92 (!) 193/80  Pulse: 79   Resp: 20   Temp: 98.7 F (37.1 C)  SpO2: 98%   Weight: 170 lb (77.1 kg)   Height: 6\' 1"  (1.854 m)    Body mass index is 22.43 kg/m.  Physical Exam Constitutional:      Appearance: Normal appearance.  HENT:     Head: Normocephalic.     Nose:     Comments: Wearing a mask Eyes:     Pupils: Pupils are equal, round, and reactive to light.  Neck:     Vascular: Carotid bruit present.  Cardiovascular:     Rate and Rhythm: Normal rate.     Heart sounds: Murmur heard.  Pulmonary:     Effort: Pulmonary effort is normal.  Abdominal:     General: Abdomen is flat.     Palpations: Abdomen is soft. There is no mass.  Musculoskeletal:        General: Normal range of motion.     Right lower leg: No edema.     Left lower leg: No edema.  Skin:    General: Skin is warm and dry.     Capillary Refill: Capillary refill takes less than 2 seconds.  Neurological:     General: No focal deficit present.     Mental Status:  He is alert.  Psychiatric:        Mood and Affect: Mood normal.    Data: CTA neck IMPRESSION: 1. 80% proximal right ICA stenosis. 2. Moderate left-sided cervical carotid atherosclerosis without significant stenosis. 3. Advanced intracranial atherosclerosis with moderate to severe anterior and posterior circulation stenoses as detailed above. 4. No evidence of acute intracranial abnormality. 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 5. Aortic Atherosclerosis (ICD10-I70.0).     Assessment/Plan:    59 year old male with high-grade asymptomatic right ICA stenosis.  Unfortunately he continues to smoke and I have counseled him on this today.  We have discussed his options for 80% right ICA stenosis being medical therapy with adding Plavix versus carotid endarterectomy versus stenting.  I have recommended intervention.  Given his previous neck fusion it does appear that transcarotid artery stenting would maybe be easier although the lesion is at the bottom of C2 and would likely be amenable to surgery if necessary.  There is low level of calcification and I think transcribed artery stenting would be certainly feasible procedure in him.  We have discussed the procedural details as well as the risk and benefits including the risk to his heart and we will have him evaluated by cardiology, the risk of stroke, the risk of postoperative hypotension.  We discussed his expected hospital course of 1-2 nights post procedurally and the need for Plavix for at least 48 hours prior to procedure and 3 months post.  I have sent Plavix to his pharmacy today to start immediately.  We will get him evaluated by cardiology and scheduled for right transcarotid artery stenting.      Waynetta Sandy MD [facility]

## 2021-07-06 NOTE — Progress Notes (Signed)
Surgical Instructions    Your procedure is scheduled on 07/09/21.  Report to [facility] Main Entrance "A" at 5:30 A.M., then check in with the Admitting office.  Call this number if you have problems the morning of surgery:  [phone]   If you have any questions prior to your surgery date call [phone]: Open Monday-Friday 8am-4pm    Remember:  Do not eat or drink after midnight the night before your surgery      Take these medicines the morning of surgery with A SIP OF WATER  amLODipine (NORVASC) metoprolol succinate (TOPROL-XL)  pravastatin (PRAVACHOL) SPIRIVA HANDIHALER  IF NEEDED: acetaminophen (TYLENOL)   As of today, STOP taking any (unless otherwise instructed by your surgeon) Aleve, Naproxen, Ibuprofen, Motrin, Advil, Goody's, BC's, all herbal medications, fish oil, and all vitamins.  PLEASE FOLLOW YOUR SURGEONS INSTRUCTIONS ON WHEN TO STOP TAKING cilostazol (PLETAL), ASPIRIN, AND clopidogrel (PLAVIX).  WHAT DO I DO ABOUT MY DIABETES MEDICATION?   Do not take oral diabetes medicines (pills) the morning of surgery.      THE MORNING OF SURGERY, do not take metFORMIN (GLUCOPHAGE).  The day of surgery, do not take other diabetes injectables, including Byetta (exenatide), Bydureon (exenatide ER), Victoza (liraglutide), or Trulicity (dulaglutide).  If your CBG is greater than 220 mg/dL, you may take  of your sliding scale (correction) dose of insulin.   HOW TO MANAGE YOUR DIABETES BEFORE AND AFTER SURGERY  Why is it important to control my blood sugar before and after surgery? Improving blood sugar levels before and after surgery helps healing and can limit problems. A way of improving blood sugar control is eating a healthy diet by:  Eating less sugar and carbohydrates  Increasing activity/exercise  Talking with your doctor about reaching your blood sugar goals High blood sugars (greater than 180 mg/dL) can raise your risk of infections and slow your  recovery, so you will need to focus on controlling your diabetes during the weeks before surgery. Make sure that the doctor who takes care of your diabetes knows about your planned surgery including the date and location.  How do I manage my blood sugar before surgery? Check your blood sugar at least 4 times a day, starting 2 days before surgery, to make sure that the level is not too high or low.  Check your blood sugar the morning of your surgery when you wake up and every 2 hours until you get to the Short Stay unit.  If your blood sugar is less than 70 mg/dL, you will need to treat for low blood sugar: Do not take insulin. Treat a low blood sugar (less than 70 mg/dL) with  cup of clear juice (cranberry or apple), 4 glucose tablets, OR glucose gel. Recheck blood sugar in 15 minutes after treatment (to make sure it is greater than 70 mg/dL). If your blood sugar is not greater than 70 mg/dL on recheck, call [phone] for further instructions. Report your blood sugar to the short stay nurse when you get to Short Stay.  If you are admitted to the hospital after surgery: Your blood sugar will be checked by the staff and you will probably be given insulin after surgery (instead of oral diabetes medicines) to make sure you have good blood sugar levels. The goal for blood sugar control after surgery is 80-180 mg/dL.    After your COVID test   You are not required to quarantine however you are required to wear a well-fitting mask when you are  out and around people not in your household.  If your mask becomes wet or soiled, replace with a new one.  Wash your hands often with soap and water for 20 seconds or clean your hands with an alcohol-based hand sanitizer that contains at least 60% alcohol.  Do not share personal items.  Notify your provider: if you are in close contact with someone who has COVID  or if you develop a fever of 100.4 or greater, sneezing, cough, sore throat, shortness  of breath or body aches.             Do not wear jewelry or makeup Do not wear lotions, powders, perfumes/colognes, or deodorant. Men may shave face and neck. Do not bring valuables to the hospital. DO Not wear nail polish, gel polish, artificial nails, or any other type of covering on natural nails including finger and toenails. If patients have artificial nails, gel coating, etc. that need to be removed by a nail salon, please have this removed prior to surgery or surgery may need to be canceled/delayed if the surgeon/ anesthesia feels like the patient is unable to be adequately monitored.             [facility] is not responsible for any belongings or valuables.  Do NOT Smoke (Tobacco/Vaping)  24 hours prior to your procedure  If you use a CPAP at night, you may bring your mask for your overnight stay.   Contacts, glasses, hearing aids, dentures or partials may not be worn into surgery, please bring cases for these belongings   For patients admitted to the hospital, discharge time will be determined by your treatment team.   Patients discharged the day of surgery will not be allowed to drive home, and someone needs to stay with them for 24 hours.  NO VISITORS WILL BE ALLOWED IN PRE-OP WHERE PATIENTS ARE PREPPED FOR SURGERY.  ONLY 1 SUPPORT PERSON MAY BE PRESENT IN THE WAITING ROOM WHILE YOU ARE IN SURGERY.  IF YOU ARE TO BE ADMITTED, ONCE YOU ARE IN YOUR ROOM YOU WILL BE ALLOWED TWO (2) VISITORS. 1 (ONE) VISITOR MAY STAY OVERNIGHT BUT MUST ARRIVE TO THE ROOM BY 8pm.  Minor children may have two parents present. Special consideration for safety and communication needs will be reviewed on a case by case basis.  Special instructions:    Oral Hygiene is also important to reduce your risk of infection.  Remember - BRUSH YOUR TEETH THE MORNING OF SURGERY WITH YOUR REGULAR TOOTHPASTE   [facility]- Preparing For Surgery  Before surgery, you can play an important role. Because skin is  not sterile, your skin needs to be as free of germs as possible. You can reduce the number of germs on your skin by washing with CHG (chlorahexidine gluconate) Soap before surgery.  CHG is an antiseptic cleaner which kills germs and bonds with the skin to continue killing germs even after washing.     Please do not use if you have an allergy to CHG or antibacterial soaps. If your skin becomes reddened/irritated stop using the CHG.  Do not shave (including legs and underarms) for at least 48 hours prior to first CHG shower. It is OK to shave your face.  Please follow these instructions carefully.     Shower the NIGHT BEFORE SURGERY and the MORNING OF SURGERY with CHG Soap.   If you chose to wash your hair, wash your hair first as usual with your normal shampoo. After you  shampoo, rinse your hair and body thoroughly to remove the shampoo.  Then [employer] and genitals (private parts) with your normal soap and rinse thoroughly to remove soap.  After that Use CHG Soap as you would any other liquid soap. You can apply CHG directly to the skin and wash gently with a scrungie or a clean washcloth.   Apply the CHG Soap to your body ONLY FROM THE NECK DOWN.  Do not use on open wounds or open sores. Avoid contact with your eyes, ears, mouth and genitals (private parts). Wash Face and genitals (private parts)  with your normal soap.   Wash thoroughly, paying special attention to the area where your surgery will be performed.  Thoroughly rinse your body with warm water from the neck down.  DO NOT shower/wash with your normal soap after using and rinsing off the CHG Soap.  Pat yourself dry with a CLEAN TOWEL.  Wear CLEAN PAJAMAS to bed the night before surgery  Place CLEAN SHEETS on your bed the night before your surgery  DO NOT SLEEP WITH PETS.   Day of Surgery: Take a shower with CHG soap. Wear Clean/Comfortable clothing the morning of surgery Do not apply any deodorants/lotions.   Remember to  brush your teeth WITH YOUR REGULAR TOOTHPASTE.   Please read over the following fact sheets that you were given.

## 2021-07-07 ENCOUNTER — Encounter (HOSPITAL_COMMUNITY): Payer: Self-pay

## 2021-07-07 ENCOUNTER — Other Ambulatory Visit: Payer: Self-pay

## 2021-07-07 ENCOUNTER — Encounter (HOSPITAL_COMMUNITY)
Admission: RE | Admit: 2021-07-07 | Discharge: 2021-07-07 | Disposition: A | Discharge status: Home / Self Care | Payer: Medicare Other | Source: Ambulatory Visit | Attending: Vascular Surgery | Admitting: Vascular Surgery

## 2021-07-07 VITALS — BP 152/78 | HR 83 | Temp 97.6°F | Resp 19 | Ht 73.0 in | Wt 162.7 lb

## 2021-07-07 DIAGNOSIS — Z01818 Encounter for other preprocedural examination: Secondary | ICD-10-CM | POA: Insufficient documentation

## 2021-07-07 DIAGNOSIS — Z20822 Contact with and (suspected) exposure to covid-19: Secondary | ICD-10-CM | POA: Insufficient documentation

## 2021-07-07 DIAGNOSIS — E119 Type 2 diabetes mellitus without complications: Secondary | ICD-10-CM | POA: Insufficient documentation

## 2021-07-07 DIAGNOSIS — I6523 Occlusion and stenosis of bilateral carotid arteries: Secondary | ICD-10-CM | POA: Insufficient documentation

## 2021-07-07 LAB — URINALYSIS, ROUTINE W REFLEX MICROSCOPIC
Bacteria, UA: NONE SEEN
Bilirubin Urine: NEGATIVE
Glucose, UA: 150 mg/dL — AB
Hgb urine dipstick: NEGATIVE
Ketones, ur: NEGATIVE mg/dL
Leukocytes,Ua: NEGATIVE
Nitrite: NEGATIVE
Protein, ur: >=300 mg/dL — AB
Specific Gravity, Urine: 1.016 (ref 1.005–1.030)
pH: 5 (ref 5.0–8.0)

## 2021-07-07 LAB — TYPE AND SCREEN
ABO/RH(D): A NEG
Antibody Screen: NEGATIVE

## 2021-07-07 LAB — COMPREHENSIVE METABOLIC PANEL
ALT: 11 U/L (ref 0–44)
AST: 17 U/L (ref 15–41)
Albumin: 3.3 g/dL — ABNORMAL LOW (ref 3.5–5.0)
Alkaline Phosphatase: 69 U/L (ref 38–126)
Anion gap: 9 (ref 5–15)
BUN: 16 mg/dL (ref 8–23)
CO2: 20 mmol/L — ABNORMAL LOW (ref 22–32)
Calcium: 9 mg/dL (ref 8.9–10.3)
Chloride: 104 mmol/L (ref 98–111)
Creatinine, Ser: 1.93 mg/dL — ABNORMAL HIGH (ref 0.61–1.24)
GFR, Estimated: 39 mL/min — ABNORMAL LOW (ref >60)
Glucose, Bld: 124 mg/dL — ABNORMAL HIGH (ref 70–99)
Potassium: 4 mmol/L (ref 3.5–5.1)
Sodium: 133 mmol/L — ABNORMAL LOW (ref 135–145)
Total Bilirubin: 0.4 mg/dL (ref 0.3–1.2)
Total Protein: 6.1 g/dL — ABNORMAL LOW (ref 6.5–8.1)

## 2021-07-07 LAB — APTT: aPTT: 29 seconds (ref 24–36)

## 2021-07-07 LAB — PROTIME-INR
INR: 0.9 (ref 0.8–1.2)
Prothrombin Time: 12.3 seconds (ref 11.4–15.2)

## 2021-07-07 LAB — CBC
HCT: 39.8 % (ref 39.0–52.0)
Hemoglobin: 12.7 g/dL — ABNORMAL LOW (ref 13.0–17.0)
MCH: 26.8 pg (ref 26.0–34.0)
MCHC: 31.9 g/dL (ref 30.0–36.0)
MCV: 84.1 fL (ref 80.0–100.0)
Platelets: 309 10*3/uL (ref 150–400)
RBC: 4.73 MIL/uL (ref 4.22–5.81)
RDW: 13.9 % (ref 11.5–15.5)
WBC: 12.1 10*3/uL — ABNORMAL HIGH (ref 4.0–10.5)
nRBC: 0 % (ref 0.0–0.2)

## 2021-07-07 LAB — HEMOGLOBIN A1C
Hgb A1c MFr Bld: 7 % — ABNORMAL HIGH (ref 4.8–5.6)
Mean Plasma Glucose: 154.2 mg/dL

## 2021-07-07 LAB — SURGICAL PCR SCREEN
MRSA, PCR: NEGATIVE
Staphylococcus aureus: NEGATIVE

## 2021-07-07 LAB — SARS CORONAVIRUS 2 (TAT 6-24 HRS): SARS Coronavirus 2: NEGATIVE

## 2021-07-07 LAB — GLUCOSE, CAPILLARY: Glucose-Capillary: 175 mg/dL — ABNORMAL HIGH (ref 70–99)

## 2021-07-07 NOTE — Progress Notes (Addendum)
PCP - Dr. Judd Lien Cardiologist - Dr. Cherlynn Kaiser- pt states he sees him on 10/27 for cardiac clearance but he does not routinely see a cardiologist  PPM/ICD - denies   Chest x-ray - 10/15/1998 EKG - 07/07/21 at PAT appt Stress Test - denies ECHO - 11/05/20 Cardiac Cath - denies  Sleep Study - 2022 CPAP - pt states he was diagnosed with sleep apnea but he has not received his CPAP yet  DM- Type 2 Fasting Blood Sugar - 100-120 Checks Blood Sugar once a week  Blood Thinner/ASA Instructions: Pt instructed to continue taking ASA and Plavix thru day of surgery Pt instructed to hold Pletal 5 days before surgery. Last dose 10/22  ERAS Protcol - no, NPO   COVID TEST- 07/07/21 at PAT appt   Anesthesia review: yes, pt going to see cardio for clearance on 10/27  Patient denies shortness of breath, fever, cough and chest pain at PAT appointment   All instructions explained to the patient, with a verbal understanding of the material. Patient agrees to go over the instructions while at home for a better understanding. Patient also instructed to wear a mask in public after being tested for COVID-19. The opportunity to ask questions was provided.

## 2021-07-08 ENCOUNTER — Encounter: Payer: Self-pay | Admitting: Internal Medicine

## 2021-07-08 ENCOUNTER — Ambulatory Visit (INDEPENDENT_AMBULATORY_CARE_PROVIDER_SITE_OTHER): Payer: Medicare Other | Admitting: Internal Medicine

## 2021-07-08 ENCOUNTER — Telehealth: Payer: Self-pay | Admitting: Medical

## 2021-07-08 ENCOUNTER — Telehealth: Payer: Self-pay

## 2021-07-08 ENCOUNTER — Ambulatory Visit (HOSPITAL_COMMUNITY)
Admission: RE | Admit: 2021-07-08 | Discharge: 2021-07-08 | Disposition: A | Discharge status: Home / Self Care | Payer: Medicare Other | Source: Ambulatory Visit | Attending: Family Medicine | Admitting: Family Medicine

## 2021-07-08 VITALS — BP 180/84 | HR 77 | Ht 73.0 in | Wt 165.2 lb

## 2021-07-08 DIAGNOSIS — E119 Type 2 diabetes mellitus without complications: Secondary | ICD-10-CM | POA: Diagnosis not present

## 2021-07-08 DIAGNOSIS — I6529 Occlusion and stenosis of unspecified carotid artery: Secondary | ICD-10-CM

## 2021-07-08 DIAGNOSIS — I779 Disorder of arteries and arterioles, unspecified: Secondary | ICD-10-CM

## 2021-07-08 DIAGNOSIS — I10 Essential (primary) hypertension: Secondary | ICD-10-CM | POA: Diagnosis not present

## 2021-07-08 DIAGNOSIS — R9431 Abnormal electrocardiogram [ECG] [EKG]: Secondary | ICD-10-CM | POA: Diagnosis not present

## 2021-07-08 DIAGNOSIS — E785 Hyperlipidemia, unspecified: Secondary | ICD-10-CM | POA: Diagnosis not present

## 2021-07-08 DIAGNOSIS — I119 Hypertensive heart disease without heart failure: Secondary | ICD-10-CM | POA: Insufficient documentation

## 2021-07-08 DIAGNOSIS — Z01810 Encounter for preprocedural cardiovascular examination: Secondary | ICD-10-CM

## 2021-07-08 DIAGNOSIS — J449 Chronic obstructive pulmonary disease, unspecified: Secondary | ICD-10-CM | POA: Insufficient documentation

## 2021-07-08 LAB — ECHOCARDIOGRAM COMPLETE
Area-P 1/2: 2.24 cm2
Height: 73 in
S' Lateral: 3.1 cm
Weight: 2643.2 oz
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
## 2021-07-08 NOTE — Telephone Encounter (Signed)
   Patient seen in the office by Dr. Margaretann Loveless 07/08/21 for preop evaluation. He was found to have new EKG changes c/f possible ischemia. With surgery date scheduled for 07/08/21 for right transcarotid artery revascularization with Dr. Donzetta Matters, it was felt that his should be rescheduled for a later date to allow time for an ischemic evaluation, including an echocardiogram and possible stress test. I have notified Dr. Claretha Cooper office at the request of Dr. Margaretann Loveless. Will defer to Dr. Margaretann Loveless to finalize cardiac clearance once work-up is complete.  Abigail Butts, PA-C 07/08/21; 2:36 PM

## 2021-07-08 NOTE — Progress Notes (Signed)
Echocardiogram 2D Echocardiogram has been performed.  Raymond [name] 07/08/2021, 3:47 PM

## 2021-07-08 NOTE — Telephone Encounter (Signed)
Telephone call received from Roby Lofts, PA-C at [facility]. Advised pt seen by cardiologist on today and showed new EKG changes. Further cardiac workup with an echo and stress test is recommended prior to patient having right TCAR surgery, which may require a couple of weeks to arrange scheduling. Will contact pt to reschedule.

## 2021-07-08 NOTE — Patient Instructions (Signed)
Medication Instructions:  No Changes In Medications at this time.  *If you need a refill on your cardiac medications before your next appointment, please call your pharmacy*  Testing/Procedures: Your physician has requested that you have an echocardiogram REPORT TO [place] [facility] [place]. Echocardiography is a painless test that uses sound waves to create images of your heart. It provides your doctor with information about the size and shape of your heart and how well your heart's chambers and valves are working. You may receive an ultrasound enhancing agent through an IV if needed to better visualize your heart during the echo.This procedure takes approximately one hour. There are no restrictions for this procedure.  Your physician has requested that you have a lexiscan myoview. For further information please visit [email or web address]. Please follow instruction sheet, as given.    The test will take approximately 3 to 4 hours to complete; you may bring reading material.  If someone comes with you to your appointment, they will need to remain in the main lobby due to limited space in the testing area.    How to prepare for your Myocardial Perfusion Test: Do not eat or drink 3 hours prior to your test, except you may have water. Do not consume products containing caffeine (regular or decaffeinated) 12 hours prior to your test. (ex: coffee, chocolate, sodas, tea). Do wear comfortable clothes (no dresses or overalls) and walking shoes, tennis shoes preferred (No heels or open toe shoes are allowed). Do NOT wear cologne, perfume, aftershave, or lotions (deodorant is allowed). If you use an inhaler, use it the AM of your test and bring it with you.  If you use a nebulizer, use it the AM of your test.  If these instructions are not followed, your test will have to be rescheduled.  Follow-Up: At [facility], you and your health needs are our priority.  As part of our  continuing mission to provide you with exceptional heart care, we have created designated Provider Care Teams.  These Care Teams include your primary Cardiologist (physician) and Advanced Practice Providers (APPs -  Physician Assistants and Nurse Practitioners) who all work together to provide you with the care you need, when you need it.  We recommend signing up for the patient portal called "MyChart".  Sign up information is provided on this After Visit Summary.  MyChart is used to connect with patients for Virtual Visits (Telemedicine).  Patients are able to view lab/test results, encounter notes, upcoming appointments, etc.  Non-urgent messages can be sent to your provider as well.   To learn more about what you can do with MyChart, go to [email or web address].    Your next appointment:   AFTER TESTING   The format for your next appointment:   In Person  Provider:   Cherlynn Kaiser, MD OR APP

## 2021-07-12 ENCOUNTER — Telehealth (HOSPITAL_COMMUNITY): Payer: Self-pay

## 2021-07-12 NOTE — Telephone Encounter (Signed)
Spoke with the patient, detailed instructions given. He stated that he would be here for his test. Asked to call back with any questions. S.Arvid Marengo EMTP 

## 2021-07-13 ENCOUNTER — Other Ambulatory Visit: Payer: Self-pay

## 2021-07-13 ENCOUNTER — Ambulatory Visit (HOSPITAL_COMMUNITY): Payer: Medicare Other | Attending: Internal Medicine

## 2021-07-13 DIAGNOSIS — E1022 Type 1 diabetes mellitus with diabetic chronic kidney disease: Secondary | ICD-10-CM | POA: Insufficient documentation

## 2021-07-13 DIAGNOSIS — I779 Disorder of arteries and arterioles, unspecified: Secondary | ICD-10-CM | POA: Diagnosis not present

## 2021-07-13 DIAGNOSIS — N189 Chronic kidney disease, unspecified: Secondary | ICD-10-CM | POA: Diagnosis not present

## 2021-07-13 DIAGNOSIS — I129 Hypertensive chronic kidney disease with stage 1 through stage 4 chronic kidney disease, or unspecified chronic kidney disease: Secondary | ICD-10-CM | POA: Insufficient documentation

## 2021-07-13 DIAGNOSIS — I6523 Occlusion and stenosis of bilateral carotid arteries: Secondary | ICD-10-CM

## 2021-07-13 LAB — MYOCARDIAL PERFUSION IMAGING
LV dias vol: 118 mL (ref 62–150)
LV sys vol: 57 mL
Nuc Stress EF: 61 %
Peak HR: 93 {beats}/min
Rest HR: 78 {beats}/min
Rest Nuclear Isotope Dose: 10.5 mCi
SDS: 0
SRS: 0
SSS: 0
ST Depression (mm): 0 mm
Stress Nuclear Isotope Dose: 31.2 mCi
TID: 1.08

## 2021-07-13 MED ORDER — REGADENOSON 0.4 MG/5ML IV SOLN
0.4000 mg | Freq: Once | INTRAVENOUS | Status: AC
  Administered 2021-07-13: 0.4 mg via INTRAVENOUS

## 2021-07-13 MED ORDER — TECHNETIUM TC 99M TETROFOSMIN IV KIT
31.2000 | PACK | Freq: Once | INTRAVENOUS | Status: AC | PRN
  Administered 2021-07-13: 31.2 via INTRAVENOUS
  Filled 2021-07-13: qty 32

## 2021-07-13 MED ORDER — TECHNETIUM TC 99M TETROFOSMIN IV KIT
10.5000 | PACK | Freq: Once | INTRAVENOUS | Status: AC | PRN
  Administered 2021-07-13: 10.5 via INTRAVENOUS
  Filled 2021-07-13: qty 11

## 2021-07-16 ENCOUNTER — Other Ambulatory Visit: Payer: Self-pay

## 2021-07-16 DIAGNOSIS — E7800 Pure hypercholesterolemia, unspecified: Secondary | ICD-10-CM | POA: Diagnosis not present

## 2021-07-16 DIAGNOSIS — E1151 Type 2 diabetes mellitus with diabetic peripheral angiopathy without gangrene: Secondary | ICD-10-CM | POA: Diagnosis not present

## 2021-07-16 DIAGNOSIS — E871 Hypo-osmolality and hyponatremia: Secondary | ICD-10-CM | POA: Diagnosis not present

## 2021-07-16 DIAGNOSIS — I10 Essential (primary) hypertension: Secondary | ICD-10-CM | POA: Diagnosis not present

## 2021-07-16 DIAGNOSIS — F17210 Nicotine dependence, cigarettes, uncomplicated: Secondary | ICD-10-CM | POA: Diagnosis not present

## 2021-07-16 DIAGNOSIS — E782 Mixed hyperlipidemia: Secondary | ICD-10-CM | POA: Diagnosis not present

## 2021-07-16 DIAGNOSIS — E1169 Type 2 diabetes mellitus with other specified complication: Secondary | ICD-10-CM | POA: Diagnosis not present

## 2021-07-16 DIAGNOSIS — E7849 Other hyperlipidemia: Secondary | ICD-10-CM | POA: Diagnosis not present

## 2021-07-16 MED ORDER — DOXAZOSIN MESYLATE 4 MG PO TABS: 4.0000 mg | ORAL_TABLET | Freq: Every day | ORAL | 3 refills | Status: AC

## 2021-07-21 DIAGNOSIS — E871 Hypo-osmolality and hyponatremia: Secondary | ICD-10-CM | POA: Diagnosis not present

## 2021-07-21 DIAGNOSIS — J439 Emphysema, unspecified: Secondary | ICD-10-CM | POA: Diagnosis not present

## 2021-07-21 DIAGNOSIS — E1122 Type 2 diabetes mellitus with diabetic chronic kidney disease: Secondary | ICD-10-CM | POA: Diagnosis not present

## 2021-07-21 DIAGNOSIS — I700 Atherosclerosis of aorta: Secondary | ICD-10-CM | POA: Diagnosis not present

## 2021-07-21 DIAGNOSIS — I10 Essential (primary) hypertension: Secondary | ICD-10-CM | POA: Diagnosis not present

## 2021-07-21 DIAGNOSIS — Z23 Encounter for immunization: Secondary | ICD-10-CM | POA: Diagnosis not present

## 2021-07-21 DIAGNOSIS — E1151 Type 2 diabetes mellitus with diabetic peripheral angiopathy without gangrene: Secondary | ICD-10-CM | POA: Diagnosis not present

## 2021-07-21 DIAGNOSIS — I6529 Occlusion and stenosis of unspecified carotid artery: Secondary | ICD-10-CM | POA: Diagnosis not present

## 2021-07-25 DIAGNOSIS — Z20822 Contact with and (suspected) exposure to covid-19: Secondary | ICD-10-CM | POA: Diagnosis not present

## 2021-08-03 ENCOUNTER — Telehealth: Payer: Self-pay

## 2021-08-03 NOTE — Telephone Encounter (Signed)
Spoke with patient and updated on arrival time of 0530 AM for procedure on 08/13/21 at [facility]. Pt verbalized understanding.

## 2021-08-04 NOTE — Pre-Procedure Instructions (Signed)
Surgical Instructions    Your procedure is scheduled on Friday, August 13, 2021 at 7:30 AM.  Report to [facility] Main Entrance "A" at 5:30 A.M., then check in with the Admitting office.  Call this number if you have problems the morning of surgery:  [phone]   If you have any questions prior to your surgery date call [phone]: Open Monday-Friday 8am-4pm    Remember:  Do not eat or drink after midnight the night before your surgery     Take these medicines the morning of surgery with A SIP OF WATER:  amLODipine (NORVASC) aspirin  diphenhydrAMINE (BENADRYL)  doxazosin (CARDURA) fluticasone (FLONASE) metoprolol succinate (TOPROL-XL) pravastatin (PRAVACHOL) SPIRIVA HANDIHALER - Bring with you the morning of surgery.  IF NEEDED: acetaminophen (TYLENOL) famotidine (PEPCID)  Follow your surgeon's instructions on when to stop cilostazol (PLETAL) and clopidogrel (PLAVIX).  If no instructions were given by your surgeon then you will need to call the office to get those instructions.     As of today, STOP taking any Aleve, Naproxen, Ibuprofen, Motrin, Advil, Goody's, BC's, all herbal medications, fish oil, and all vitamins.  WHAT DO I DO ABOUT MY DIABETES MEDICATION?   Do not take metFORMIN (GLUCOPHAGE) the morning of surgery.   HOW TO MANAGE YOUR DIABETES BEFORE AND AFTER SURGERY  Why is it important to control my blood sugar before and after surgery? Improving blood sugar levels before and after surgery helps healing and can limit problems. A way of improving blood sugar control is eating a healthy diet by:  Eating less sugar and carbohydrates  Increasing activity/exercise  Talking with your doctor about reaching your blood sugar goals High blood sugars (greater than 180 mg/dL) can raise your risk of infections and slow your recovery, so you will need to focus on controlling your diabetes during the weeks before surgery. Make sure that the doctor who takes care of  your diabetes knows about your planned surgery including the date and location.  How do I manage my blood sugar before surgery? Check your blood sugar at least 4 times a day, starting 2 days before surgery, to make sure that the level is not too high or low.  Check your blood sugar the morning of your surgery when you wake up and every 2 hours until you get to the Short Stay unit.  If your blood sugar is less than 70 mg/dL, you will need to treat for low blood sugar: Do not take insulin. Treat a low blood sugar (less than 70 mg/dL) with  cup of clear juice (cranberry or apple), 4 glucose tablets, OR glucose gel. Recheck blood sugar in 15 minutes after treatment (to make sure it is greater than 70 mg/dL). If your blood sugar is not greater than 70 mg/dL on recheck, call [phone] for further instructions. Report your blood sugar to the short stay nurse when you get to Short Stay.  If you are admitted to the hospital after surgery: Your blood sugar will be checked by the staff and you will probably be given insulin after surgery (instead of oral diabetes medicines) to make sure you have good blood sugar levels. The goal for blood sugar control after surgery is 80-180 mg/dL.                      Do NOT Smoke (Tobacco/Vaping) or drink Alcohol 24 hours prior to your procedure.  If you use a CPAP at night, you may bring all equipment for your  overnight stay.   Contacts, glasses, piercing's, hearing aid's, dentures or partials may not be worn into surgery, please bring cases for these belongings.    For patients admitted to the hospital, discharge time will be determined by your treatment team.   Patients discharged the day of surgery will not be allowed to drive home, and someone needs to stay with them for 24 hours.  NO VISITORS WILL BE ALLOWED IN PRE-OP WHERE PATIENTS GET READY FOR SURGERY.  ONLY 1 SUPPORT PERSON MAY BE PRESENT IN THE WAITING ROOM WHILE YOU ARE IN SURGERY.  IF YOU ARE TO  BE ADMITTED, ONCE YOU ARE IN YOUR ROOM YOU WILL BE ALLOWED TWO (2) VISITORS.  Minor children may have two parents present. Special consideration for safety and communication needs will be reviewed on a case by case basis.   Special instructions:   [place]- Preparing For Surgery  Before surgery, you can play an important role. Because skin is not sterile, your skin needs to be as free of germs as possible. You can reduce the number of germs on your skin by washing with CHG (chlorahexidine gluconate) Soap before surgery.  CHG is an antiseptic cleaner which kills germs and bonds with the skin to continue killing germs even after washing.    Oral Hygiene is also important to reduce your risk of infection.  Remember - BRUSH YOUR TEETH THE MORNING OF SURGERY WITH YOUR REGULAR TOOTHPASTE  Please do not use if you have an allergy to CHG or antibacterial soaps. If your skin becomes reddened/irritated stop using the CHG.  Do not shave (including legs and underarms) for at least 48 hours prior to first CHG shower. It is OK to shave your face.  Please follow these instructions carefully.   Shower the NIGHT BEFORE SURGERY and the MORNING OF SURGERY  If you chose to wash your hair, wash your hair first as usual with your normal shampoo.  After you shampoo, rinse your hair and body thoroughly to remove the shampoo.  Use CHG Soap as you would any other liquid soap. You can apply CHG directly to the skin and wash gently with a scrungie or a clean washcloth.   Apply the CHG Soap to your body ONLY FROM THE NECK DOWN.  Do not use on open wounds or open sores. Avoid contact with your eyes, ears, mouth and genitals (private parts). Wash Face and genitals (private parts)  with your normal soap.   Wash thoroughly, paying special attention to the area where your surgery will be performed.  Thoroughly rinse your body with warm water from the neck down.  DO NOT shower/wash with your normal soap after using and  rinsing off the CHG Soap.  Pat yourself dry with a CLEAN TOWEL.  Wear CLEAN PAJAMAS to bed the night before surgery  Place CLEAN SHEETS on your bed the night before your surgery  DO NOT SLEEP WITH PETS.   Day of Surgery: Shower with CHG soap. Do not wear jewelry. Do not wear lotions, powders, colognes, or deodorant. Men may shave face and neck. Do not bring valuables to the hospital. [facility] is not responsible for any belongings or valuables. Wear Clean/Comfortable clothing the morning of surgery Remember to brush your teeth WITH YOUR REGULAR TOOTHPASTE.   Please read over the following fact sheets that you were given.   3 days prior to your procedure or After your COVID test   You are not required to quarantine however you are required  to wear a well-fitting mask when you are out and around people not in your household. If your mask becomes wet or soiled, replace with a new one.   Wash your hands often with soap and water for 20 seconds or clean your hands with an alcohol-based hand sanitizer that contains at least 60% alcohol.   Do not share personal items.   Notify your provider:  o if you are in close contact with someone who has COVID  o or if you develop a fever of 100.4 or greater, sneezing, cough, sore throat, shortness of breath or body aches.

## 2021-08-09 ENCOUNTER — Encounter (HOSPITAL_COMMUNITY): Payer: Self-pay

## 2021-08-09 ENCOUNTER — Ambulatory Visit (INDEPENDENT_AMBULATORY_CARE_PROVIDER_SITE_OTHER): Payer: Medicare Other | Admitting: Pharmacist Clinician (PhC)/ Clinical Pharmacy Specialist

## 2021-08-09 ENCOUNTER — Encounter (HOSPITAL_COMMUNITY)
Admission: RE | Admit: 2021-08-09 | Discharge: 2021-08-09 | Disposition: A | Discharge status: Home / Self Care | Payer: Medicare Other | Source: Ambulatory Visit | Attending: Vascular Surgery | Admitting: Vascular Surgery

## 2021-08-09 ENCOUNTER — Telehealth: Payer: Self-pay

## 2021-08-09 ENCOUNTER — Other Ambulatory Visit: Payer: Self-pay

## 2021-08-09 VITALS — BP 180/63 | HR 92 | Temp 97.8°F | Resp 18 | Ht 73.0 in | Wt 167.6 lb

## 2021-08-09 DIAGNOSIS — N183 Chronic kidney disease, stage 3 unspecified: Secondary | ICD-10-CM | POA: Insufficient documentation

## 2021-08-09 DIAGNOSIS — I779 Disorder of arteries and arterioles, unspecified: Secondary | ICD-10-CM | POA: Diagnosis not present

## 2021-08-09 DIAGNOSIS — E782 Mixed hyperlipidemia: Secondary | ICD-10-CM

## 2021-08-09 DIAGNOSIS — J449 Chronic obstructive pulmonary disease, unspecified: Secondary | ICD-10-CM | POA: Insufficient documentation

## 2021-08-09 DIAGNOSIS — E785 Hyperlipidemia, unspecified: Secondary | ICD-10-CM | POA: Diagnosis not present

## 2021-08-09 DIAGNOSIS — F17210 Nicotine dependence, cigarettes, uncomplicated: Secondary | ICD-10-CM | POA: Insufficient documentation

## 2021-08-09 DIAGNOSIS — E1136 Type 2 diabetes mellitus with diabetic cataract: Secondary | ICD-10-CM | POA: Diagnosis not present

## 2021-08-09 DIAGNOSIS — G2581 Restless legs syndrome: Secondary | ICD-10-CM | POA: Diagnosis not present

## 2021-08-09 DIAGNOSIS — E1122 Type 2 diabetes mellitus with diabetic chronic kidney disease: Secondary | ICD-10-CM | POA: Diagnosis not present

## 2021-08-09 DIAGNOSIS — Z01812 Encounter for preprocedural laboratory examination: Secondary | ICD-10-CM | POA: Diagnosis not present

## 2021-08-09 DIAGNOSIS — I129 Hypertensive chronic kidney disease with stage 1 through stage 4 chronic kidney disease, or unspecified chronic kidney disease: Secondary | ICD-10-CM | POA: Diagnosis not present

## 2021-08-09 DIAGNOSIS — Z8616 Personal history of COVID-19: Secondary | ICD-10-CM | POA: Insufficient documentation

## 2021-08-09 DIAGNOSIS — Z419 Encounter for procedure for purposes other than remedying health state, unspecified: Secondary | ICD-10-CM | POA: Insufficient documentation

## 2021-08-09 DIAGNOSIS — I10 Essential (primary) hypertension: Secondary | ICD-10-CM

## 2021-08-09 DIAGNOSIS — Z01818 Encounter for other preprocedural examination: Secondary | ICD-10-CM

## 2021-08-09 DIAGNOSIS — I6523 Occlusion and stenosis of bilateral carotid arteries: Secondary | ICD-10-CM | POA: Diagnosis not present

## 2021-08-09 HISTORY — DX: Chronic kidney disease, unspecified: N18.9

## 2021-08-09 HISTORY — DX: Sleep apnea, unspecified: G47.30

## 2021-08-09 LAB — URINALYSIS, ROUTINE W REFLEX MICROSCOPIC
Bilirubin Urine: NEGATIVE
Glucose, UA: 50 mg/dL — AB
Hgb urine dipstick: NEGATIVE
Ketones, ur: NEGATIVE mg/dL
Leukocytes,Ua: NEGATIVE
Nitrite: NEGATIVE
Protein, ur: 100 mg/dL — AB
Specific Gravity, Urine: 1.01 (ref 1.005–1.030)
pH: 6 (ref 5.0–8.0)

## 2021-08-09 LAB — COMPREHENSIVE METABOLIC PANEL
ALT: 14 U/L (ref 0–44)
AST: 11 U/L — ABNORMAL LOW (ref 15–41)
Albumin: 2.9 g/dL — ABNORMAL LOW (ref 3.5–5.0)
Alkaline Phosphatase: 80 U/L (ref 38–126)
Anion gap: 6 (ref 5–15)
BUN: 14 mg/dL (ref 8–23)
CO2: 23 mmol/L (ref 22–32)
Calcium: 8.8 mg/dL — ABNORMAL LOW (ref 8.9–10.3)
Chloride: 101 mmol/L (ref 98–111)
Creatinine, Ser: 1.93 mg/dL — ABNORMAL HIGH (ref 0.61–1.24)
GFR, Estimated: 39 mL/min — ABNORMAL LOW (ref >60)
Glucose, Bld: 126 mg/dL — ABNORMAL HIGH (ref 70–99)
Potassium: 4.6 mmol/L (ref 3.5–5.1)
Sodium: 130 mmol/L — ABNORMAL LOW (ref 135–145)
Total Bilirubin: 0.3 mg/dL (ref 0.3–1.2)
Total Protein: 5.9 g/dL — ABNORMAL LOW (ref 6.5–8.1)

## 2021-08-09 LAB — TYPE AND SCREEN
ABO/RH(D): A NEG
Antibody Screen: NEGATIVE

## 2021-08-09 LAB — CBC
HCT: 31.2 % — ABNORMAL LOW (ref 39.0–52.0)
Hemoglobin: 10.4 g/dL — ABNORMAL LOW (ref 13.0–17.0)
MCH: 27.7 pg (ref 26.0–34.0)
MCHC: 33.3 g/dL (ref 30.0–36.0)
MCV: 83.2 fL (ref 80.0–100.0)
Platelets: 298 10*3/uL (ref 150–400)
RBC: 3.75 MIL/uL — ABNORMAL LOW (ref 4.22–5.81)
RDW: 13.7 % (ref 11.5–15.5)
WBC: 10.3 10*3/uL (ref 4.0–10.5)
nRBC: 0 % (ref 0.0–0.2)

## 2021-08-09 LAB — GLUCOSE, CAPILLARY: Glucose-Capillary: 128 mg/dL — ABNORMAL HIGH (ref 70–99)

## 2021-08-09 LAB — PROTIME-INR
INR: 1 (ref 0.8–1.2)
Prothrombin Time: 13 seconds (ref 11.4–15.2)

## 2021-08-09 LAB — APTT: aPTT: 30 seconds (ref 24–36)

## 2021-08-09 LAB — SURGICAL PCR SCREEN
MRSA, PCR: NEGATIVE
Staphylococcus aureus: NEGATIVE

## 2021-08-09 NOTE — Pre-Procedure Instructions (Signed)
Surgical Instructions    Your procedure is scheduled on Friday, August 13, 2021 at 7:30 AM.  Report to [facility] Main Entrance "A" at 5:30 A.M., then check in with the Admitting office.  Call this number if you have problems the morning of surgery:  [phone]   If you have any questions prior to your surgery date call [phone]: Open Monday-Friday 8am-4pm    Remember:  Do not eat or drink after midnight the night before your surgery     Take these medicines the morning of surgery with A SIP OF WATER:  amLODipine (NORVASC) aspirin  clopidogrel (PLAVIX)  famotidine (PEPCID) doxazosin (CARDURA) fluticasone (FLONASE) metoprolol succinate (TOPROL-XL) rosuvastatin (CRESTOR) SPIRIVA HANDIHALER - Bring with you the morning of surgery.  IF NEEDED: acetaminophen (TYLENOL) Eye drops diphenhydrAMINE (BENADRYL)   STOP cilostazol (PLETAL) 5 days prior to your procedure. Last dose should by Saturday, November 26th.  As of today, STOP taking any Aleve, Naproxen, Ibuprofen, Motrin, Advil, Goody's, BC's, all herbal medications, fish oil, and all vitamins.  WHAT DO I DO ABOUT MY DIABETES MEDICATION?   Do not take metFORMIN (GLUCOPHAGE) the morning of surgery.   HOW TO MANAGE YOUR DIABETES BEFORE AND AFTER SURGERY  Why is it important to control my blood sugar before and after surgery? Improving blood sugar levels before and after surgery helps healing and can limit problems. A way of improving blood sugar control is eating a healthy diet by:  Eating less sugar and carbohydrates  Increasing activity/exercise  Talking with your doctor about reaching your blood sugar goals High blood sugars (greater than 180 mg/dL) can raise your risk of infections and slow your recovery, so you will need to focus on controlling your diabetes during the weeks before surgery. Make sure that the doctor who takes care of your diabetes knows about your planned surgery including the date and  location.  How do I manage my blood sugar before surgery? Check your blood sugar at least 4 times a day, starting 2 days before surgery, to make sure that the level is not too high or low.  Check your blood sugar the morning of your surgery when you wake up and every 2 hours until you get to the Short Stay unit.  If your blood sugar is less than 70 mg/dL, you will need to treat for low blood sugar: Do not take insulin. Treat a low blood sugar (less than 70 mg/dL) with  cup of clear juice (cranberry or apple), 4 glucose tablets, OR glucose gel. Recheck blood sugar in 15 minutes after treatment (to make sure it is greater than 70 mg/dL). If your blood sugar is not greater than 70 mg/dL on recheck, call [phone] for further instructions. Report your blood sugar to the short stay nurse when you get to Short Stay.  If you are admitted to the hospital after surgery: Your blood sugar will be checked by the staff and you will probably be given insulin after surgery (instead of oral diabetes medicines) to make sure you have good blood sugar levels. The goal for blood sugar control after surgery is 80-180 mg/dL.                      Do NOT Smoke (Tobacco/Vaping) or drink Alcohol 24 hours prior to your procedure.  If you use a CPAP at night, you may bring all equipment for your overnight stay.   Contacts, glasses, piercing's, hearing aid's, dentures or partials may not be worn  into surgery, please bring cases for these belongings.    For patients admitted to the hospital, discharge time will be determined by your treatment team.   Patients discharged the day of surgery will not be allowed to drive home, and someone needs to stay with them for 24 hours.  NO VISITORS WILL BE ALLOWED IN PRE-OP WHERE PATIENTS GET READY FOR SURGERY.  ONLY 1 SUPPORT PERSON MAY BE PRESENT IN THE WAITING ROOM WHILE YOU ARE IN SURGERY.  IF YOU ARE TO BE ADMITTED, ONCE YOU ARE IN YOUR ROOM YOU WILL BE ALLOWED TWO (2)  VISITORS.  Minor children may have two parents present. Special consideration for safety and communication needs will be reviewed on a case by case basis.   Special instructions:   - Preparing For Surgery  Before surgery, you can play an important role. Because skin is not sterile, your skin needs to be as free of germs as possible. You can reduce the number of germs on your skin by washing with CHG (chlorahexidine gluconate) Soap before surgery.  CHG is an antiseptic cleaner which kills germs and bonds with the skin to continue killing germs even after washing.    Oral Hygiene is also important to reduce your risk of infection.  Remember - BRUSH YOUR TEETH THE MORNING OF SURGERY WITH YOUR REGULAR TOOTHPASTE  Please do not use if you have an allergy to CHG or antibacterial soaps. If your skin becomes reddened/irritated stop using the CHG.  Do not shave (including legs and underarms) for at least 48 hours prior to first CHG shower. It is OK to shave your face.  Please follow these instructions carefully.   Shower the NIGHT BEFORE SURGERY and the MORNING OF SURGERY  If you chose to wash your hair, wash your hair first as usual with your normal shampoo.  After you shampoo, rinse your hair and body thoroughly to remove the shampoo.  Use CHG Soap as you would any other liquid soap. You can apply CHG directly to the skin and wash gently with a scrungie or a clean washcloth.   Apply the CHG Soap to your body ONLY FROM THE NECK DOWN.  Do not use on open wounds or open sores. Avoid contact with your eyes, ears, mouth and genitals (private parts). Wash Face and genitals (private parts)  with your normal soap.   Wash thoroughly, paying special attention to the area where your surgery will be performed.  Thoroughly rinse your body with warm water from the neck down.  DO NOT shower/wash with your normal soap after using and rinsing off the CHG Soap.  Pat yourself dry with a CLEAN  TOWEL.  Wear CLEAN PAJAMAS to bed the night before surgery  Place CLEAN SHEETS on your bed the night before your surgery  DO NOT SLEEP WITH PETS.   Day of Surgery: Shower with CHG soap. Do not wear jewelry. Do not wear lotions, powders, colognes, or deodorant. Men may shave face and neck. Do not bring valuables to the hospital. [facility] is not responsible for any belongings or valuables. Wear Clean/Comfortable clothing the morning of surgery Remember to brush your teeth WITH YOUR REGULAR TOOTHPASTE.   Please read over the following fact sheets that you were given.   3 days prior to your procedure or After your COVID test   You are not required to quarantine however you are required to wear a well-fitting mask when you are out and around people not in your household.  If your mask becomes wet or soiled, replace with a new one.   Wash your hands often with soap and water for 20 seconds or clean your hands with an alcohol-based hand sanitizer that contains at least 60% alcohol.   Do not share personal items.   Notify your provider:  o if you are in close contact with someone who has COVID  o or if you develop a fever of 100.4 or greater, sneezing, cough, sore throat, shortness of breath or body aches.

## 2021-08-09 NOTE — Addendum Note (Signed)
Addended by: Nicholas Lose on: 08/09/2021 09:08 AM   Modules accepted: Orders

## 2021-08-09 NOTE — Patient Instructions (Signed)
Return for a a follow up appointment January 17 at 9:30 am  Check your blood pressure at home daily (if able) and keep record of the readings. - Start about December 12 (give yourself some time to heal)  Take your BP meds as follows:  Continue with all current medications  Bring all of your meds, your BP cuff and your record of home blood pressures to your next appointment.  Exercise as you're able, try to walk approximately 30 minutes per day.  Keep salt intake to a minimum, especially watch canned and prepared boxed foods.  Eat more fresh fruits and vegetables and fewer canned items.  Avoid eating in fast food restaurants.    HOW TO TAKE YOUR BLOOD PRESSURE: Rest 5 minutes before taking your blood pressure.  Don't smoke or drink caffeinated beverages for at least 30 minutes before. Take your blood pressure before (not after) you eat. Sit comfortably with your back supported and both feet on the floor (don't cross your legs). Elevate your arm to heart level on a table or a desk. Use the proper sized cuff. It should fit smoothly and snugly around your bare upper arm. There should be enough room to slip a fingertip under the cuff. The bottom edge of the cuff should be 1 inch above the crease of the elbow. Ideally, take 3 measurements at one sitting and record the average.

## 2021-08-09 NOTE — Progress Notes (Addendum)
PCP - Dr. Judd Lien Cardiologist - Dr. Kelvin Cellar  PPM/ICD - n/a  Chest x-ray - n/a EKG - 07/07/21 Stress Test - 07/13/21 ECHO - 07/08/21 Cardiac Cath - denies  Sleep Study - OSA+ CPAP - denies use, pt states he was newly diagnosed and is awaiting insurance approval for CPAP.  Fasting Blood Sugar - 115-140 Checks Blood Sugar 1 time a week. CBG in PAT: 128 Last A1C-7.0 on 07/07/21  Blood Thinner Instructions: Pletal, hold 5 days pre-op. LD 11/26. Continue Plavix per [facility]. Aspirin Instructions: Continue per [facility].  ERAS Protcol -n/a PRE-SURGERY Ensure or G2- n/a  COVID TEST- Will need covid test DOS. Pt lives out of town and was not within time frame to test in PAT appt.    Anesthesia review: Yes, cardiac clearance received.   Patient denies shortness of breath, fever, cough and chest pain at PAT appointment   All instructions explained to the patient, with a verbal understanding of the material. Patient agrees to go over the instructions while at home for a better understanding. Patient also instructed to self quarantine after being tested for COVID-19. The opportunity to ask questions was provided.

## 2021-08-09 NOTE — Telephone Encounter (Signed)
Lmom to please fax over lipid panel from 07/17/21 since kpn is only showing part of it

## 2021-08-09 NOTE — Progress Notes (Signed)
08/10/2021 CEBERT DETTMANN May 21, 1961 [id number]   HPI:  Raymond [name] is a 60 y.o. male patient of Dr Margaretann Loveless, with a PMH below who presents today for hypertension clinic evaluation.  He was seen by Dr. Margaretann Loveless last month for cardiac clearance for a scheduled transcarotid artery revascularization.  Unfortunately the procedure had to be rescheduled until he could obtain an echocardiogram then Lexiscan stress test.  He has now been cleared from a cardiac standpoint and will have the procedure this Friday.  Dr. Margaretann Loveless asked that we follow with him in CVRR as his BP has been somewhat elevated (180/84 at visit with her), and he will need more aggressive cholesterol management as well.    Today he is here for follow up. Was at hospital pre-op appointment this morning, and admits to being very nervous about upcoming procedure.  Home BP readings have been high at home much of the past couple of weeks as this was rescheduled.   He notes that he divides his BP medications, taking 1 every about every 4 hours,  then doxazosin at bedtime.   Past Medical History: hypertension   hyperlipidemia   CAD Having carotid revascularization Friday     Blood Pressure Goal:  130/80  Current Medications: lisinopril 40 mg qd (early am), metoprolol suc 50 mg qd (2 pm), doxazosin 4 mg qd (hs) amlodipine 10 mg qd (10 am)  Family Hx:  mother died at 33, lung cancer, father at 62 from multiple strokes; 1 sister deceased, 67 living with DM otherwise healthy; 2 boys, 1 deceased from accident  Social Hx: down to 2 cigarettes per day; no alcohol, coffee in the morning, pepsi during the day  Diet: mix of home cooked, eats out on Sundays usually; avoids adding salt  Exercise: yard work, house work  Home BP readings: no home readings with him today, states rarely > 160 systolic, usually 10'X diastolic  Intolerances: nkda  Labs: 11/22:  Na 130, K 4.6, Glu 126, BUN 14, SCr 1.93, GFR 39   TC 196, TG 178, HDL 39  (no  LDL listed in [place])   Wt Readings from Last 3 Encounters:  08/09/21 167 lb 9.6 oz (76 kg)  08/09/21 171 lb 3.2 oz (77.7 kg)  07/13/21 165 lb (74.8 kg)   BP Readings from Last 3 Encounters:  08/09/21 (!) 180/63  08/09/21 (!) 152/66  07/08/21 (!) 180/84   Pulse Readings from Last 3 Encounters:  08/09/21 92  08/09/21 84  07/08/21 77    Current Outpatient Medications  Medication Sig Dispense Refill   acetaminophen (TYLENOL) 500 MG tablet Take 1,000 mg by mouth every 6 (six) hours as needed for mild pain.     amLODipine (NORVASC) 10 MG tablet Take 10 mg by mouth daily.     aspirin 81 MG tablet Take 81 mg by mouth daily.     cilostazol (PLETAL) 100 MG tablet Take 1 tablet (100 mg total) by mouth 2 (two) times daily before a meal. 180 tablet 3   diphenhydrAMINE (BENADRYL) 25 MG tablet Take 25 mg by mouth 2 (two) times daily as needed for allergies.     doxazosin (CARDURA) 4 MG tablet Take 1 tablet (4 mg total) by mouth daily. 30 tablet 3   famotidine (PEPCID) 20 MG tablet Take 20 mg by mouth daily.     fluticasone (FLONASE) 50 MCG/ACT nasal spray Place 2 sprays into both nostrils daily.     lisinopril (PRINIVIL,ZESTRIL) 40 MG tablet Take  40 mg by mouth daily.     metFORMIN (GLUCOPHAGE) 1000 MG tablet Take 1,000 mg by mouth 2 (two) times daily with a meal.     metoprolol succinate (TOPROL-XL) 50 MG 24 hr tablet Take 50 mg by mouth daily.     Multiple Vitamin (MULTIVITAMIN WITH MINERALS) TABS tablet Take 1 tablet by mouth daily.     polyvinyl alcohol (LIQUIFILM TEARS) 1.4 % ophthalmic solution 1 drop as needed for dry eyes.     rosuvastatin (CRESTOR) 20 MG tablet Take 20 mg by mouth daily.     SPIRIVA HANDIHALER 18 MCG inhalation capsule Place 18 mcg into inhaler and inhale daily.     traZODone (DESYREL) 100 MG tablet Take 100 mg by mouth at bedtime.     clopidogrel (PLAVIX) 75 MG tablet Take 1 tablet (75 mg total) by mouth daily. (Patient not taking: Reported on 08/09/2021) 30 tablet 6    No current facility-administered medications for this visit.    No Known Allergies  Past Medical History:  Diagnosis Date   Cancer ([place])    melanoma   Carotid artery occlusion    Cervical spondylosis with myelopathy    Arthritis   COPD (chronic obstructive pulmonary disease) ([place]) 2022   Diabetes mellitus    Type II   Hyperlipidemia    Hypertension    Impotence of organic origin    Insomnia    Peripheral arterial disease ([facility])    Restless leg syndrome    Sleep apnea     Blood pressure (!) 152/66, pulse 84, height 6\' 1"  (1.854 m), weight 171 lb 3.2 oz (77.7 kg), SpO2 96 %.  Hypertension Patient with essential hypertension, currently not at goal.  He is scheduled for carotid surgery this Friday and is understandably nervous.  Notes that his pressure has been higher for the past month, as the surgery was rescheduled due to need for further CV testing.  Advised patient to resume checking home BP readings about 1 week after his procedure.  Will re-evaluate in January, thus determining how much, if any, of his current elevations in BP are due to fears/concerns about the surgery.    Hyperlipidemia Most recent draw was earlier this month, however LDL did not post with other readings in [place].  We will obtain this information and when he comes back in January to discuss hypertension, we can then discuss lipid lowering options.    Tommy Medal PharmD CPP [facility] [address] [place], [place] [address] [phone]

## 2021-08-10 ENCOUNTER — Encounter: Payer: Self-pay | Admitting: Pharmacist Clinician (PhC)/ Clinical Pharmacy Specialist

## 2021-08-10 DIAGNOSIS — I10 Essential (primary) hypertension: Secondary | ICD-10-CM | POA: Insufficient documentation

## 2021-08-10 DIAGNOSIS — E785 Hyperlipidemia, unspecified: Secondary | ICD-10-CM | POA: Insufficient documentation

## 2021-08-10 NOTE — Assessment & Plan Note (Signed)
Patient with essential hypertension, currently not at goal.  He is scheduled for carotid surgery this Friday and is understandably nervous.  Notes that his pressure has been higher for the past month, as the surgery was rescheduled due to need for further CV testing.  Advised patient to resume checking home BP readings about 1 week after his procedure.  Will re-evaluate in January, thus determining how much, if any, of his current elevations in BP are due to fears/concerns about the surgery.

## 2021-08-10 NOTE — Anesthesia Preprocedure Evaluation (Addendum)
Anesthesia Evaluation  Patient identified by MRN, date of birth, ID band Patient awake    Reviewed: Allergy & Precautions, NPO status , Patient's Chart, lab work & pertinent test results, reviewed documented beta blocker date and time   Airway Mallampati: II  TM Distance: >3 FB Neck ROM: Full    Dental  (+) Dental Advisory Given, Poor Dentition, Chipped,    Pulmonary sleep apnea , COPD,  COPD inhaler, Current Smoker and Patient abstained from smoking.,    Pulmonary exam normal breath sounds clear to auscultation       Cardiovascular hypertension, Pt. on medications and Pt. on home beta blockers (-) angina+ Peripheral Vascular Disease (ASYMPTOMATIC RIGHT CAROTID STENOSIS)  (-) Past MI Normal cardiovascular exam Rhythm:Regular Rate:Normal     Neuro/Psych Cervical spondylosis with myelopathy negative psych ROS   GI/Hepatic Neg liver ROS, GERD  Medicated,  Endo/Other  diabetes, Type 2, Oral Hypoglycemic Agents  Renal/GU Renal InsufficiencyRenal disease     Musculoskeletal  (+) Arthritis ,   Abdominal   Peds  Hematology  (+) Blood dyscrasia (Plavix), anemia ,   Anesthesia Other Findings   Reproductive/Obstetrics                           Anesthesia Physical Anesthesia Plan  ASA: 3  Anesthesia Plan: General   Post-op Pain Management: Tylenol PO (pre-op)   Induction: Intravenous  PONV Risk Score and Plan: 2 and Midazolam, Dexamethasone and Ondansetron  Airway Management Planned: Oral ETT  Additional Equipment: Arterial line  Intra-op Plan:   Post-operative Plan: Extubation in OR  Informed Consent: I have reviewed the patients History and Physical, chart, labs and discussed the procedure including the risks, benefits and alternatives for the proposed anesthesia with the patient or authorized representative who has indicated his/her understanding and acceptance.     Dental advisory  given  Plan Discussed with: CRNA  Anesthesia Plan Comments: (PAT note written 08/10/2021 by Myra Gianotti, PA-C. )      Anesthesia Quick Evaluation

## 2021-08-10 NOTE — Progress Notes (Addendum)
Anesthesia Chart Review:  Case: [id number] Date/Time: 08/13/21 0715   Procedure: RIGHT TRANSCAROTID ARTERY REVASCULARIZATION (Right)   Anesthesia type: General   Pre-op diagnosis: ASYMPTOMATIC RIGHT CAROTID STENOSIS   Location: [facility] ROOM 16 / [facility]   Surgeons: Waynetta Sandy, MD       DISCUSSION: Patient is a 60 year old male scheduled for the above procedure.  Surgery was initially scheduled for 07/09/2021, but rescheduled for 08/13/21 to allow time for cardiology evaluation with testing. Since October, he had an echo that showed LVEF 50-55% with mild anterior hypokinesis. He subsequently had a stress test on 07/13/21 that Dr. Margaretann Loveless considered "intermediate risk"--no ischemic on stress images but probable small area of anterior wall infarct. No further preoperative cardiac testing recommended prior to right TCAR, but has been working on optimizing BP in the meantime.   History includes smoking, HTN, HLD, DM2, PAD, carotid artery disease, OSA (recent diagnosis, awaiting approval for CPAP), COPD, RLS, skin cancer (melanoma), spinal surgery  (s/p cervical fusion 2000), COVID-19 (09/2020). 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   Preoperative labs noted. BUN 14, Cr 1.93 which is consistent with previously results from 07/07/21 and 06/11/21. Will request last office note and labs from [place].   Per [facility], hold Pletal for 5 days (last dose per patient 08/07/21), but continue Plavix and ASA.  Day of surgery COVID-19 testing is planned as [place].   UPDATE 08/11/21 5:33 PM: Received latest records from Dr. Pleas Koch. Last visit 07/21/21. He notes plans for surgery. Patient does have known history of CKD stage III with eGFR 52 04/2021 and 46 07/16/21 (will add CKD to history) and "Mod. OSA: s/p sleep study 8/22" and still waiting on CPAP.    VS: BP (!) 180/63   Pulse 92   Temp 36.6 C   Resp 18   Ht _0  (1.854 m)   Wt  76 kg   SpO2 99%   BMI 22.11 kg/m    PROVIDERS: Burdine, Virgina Evener, MD is PCP ([facility]) Cherlynn Kaiser, MD is cardiologist. Also saw Tommy Medal, [place] on 08/09/21  in the HTN Clinic. BP 180/63 and 152/66 at that visit in setting of anxiety related to upcoming TCAR. Patient to continue home BP monitoring with HTN follow-up in 09/2021.    LABS: Preoperative labs noted. See DISCUSSION. (all labs ordered are listed, but only abnormal results are displayed)  Labs Reviewed  GLUCOSE, CAPILLARY - Abnormal; Notable for the following components:      Result Value   Glucose-Capillary 128 (*)    All other components within normal limits  CBC - Abnormal; Notable for the following components:   RBC 3.75 (*)    Hemoglobin 10.4 (*)    HCT 31.2 (*)    All other components within normal limits  COMPREHENSIVE METABOLIC PANEL - Abnormal; Notable for the following components:   Sodium 130 (*)    Glucose, Bld 126 (*)    Creatinine, Ser 1.93 (*)    Calcium 8.8 (*)    Total Protein 5.9 (*)    Albumin 2.9 (*)    AST 11 (*)    GFR, Estimated 39 (*)    All other components within normal limits  URINALYSIS, ROUTINE W REFLEX MICROSCOPIC - Abnormal; Notable for the following components:   Glucose, UA 50 (*)    Protein, ur 100 (*)    Bacteria, UA RARE (*)    All other  components within normal limits  SURGICAL PCR SCREEN  PROTIME-INR  APTT  TYPE AND SCREEN     IMAGES: CT Head & CTA Head/Neck 06/11/21: IMPRESSION: 1. 80% proximal right ICA stenosis. 2. Moderate left-sided cervical carotid atherosclerosis without significant stenosis. 3. Advanced intracranial atherosclerosis with moderate to severe anterior and posterior circulation stenoses as detailed above. 4. No evidence of acute intracranial abnormality. 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 5. Aortic Atherosclerosis (ICD10-I70.0).    EKG: 07/07/21: Normal sinus rhythm Anterior infarct , age  undetermined Abnormal ECG Since last tracing poor R wave progression is new Confirmed by Oswaldo Milian [phone]) on 07/07/2021 8:55:35 PM   CV: Nuclear stress test 07/13/21: Findings: Negative for stress induced arrhythmias, baseline anterior infarct pattern on ECG. There is an true apical perfusion defect in rest and stress without wall motion abnormality more consistent with apical thinning.   This is less consistent with infarct.   Conclusions: Stress test is negative for ischemia.    Findings are consistent with ischemia. The study is low risk.   No ST deviation was noted.   Left ventricular function is normal. Nuclear stress EF: 61 %. The left ventricular ejection fraction is normal (55-65%). End diastolic cavity size is normal.   Prior study not available for comparison. Reviewed by Dr. Cline Crock wrote, "No ischemia on stress test images. Probable small area of anterior wall infarct at the apex and mid ventricle which is consistent with known wall motion abnormality on echo and ECG. Stress test is intermediate risk. I have independently reviewed the images. I would start the patient on doxazosin 4 mg daily and refer to [facility] HTN clinic for further optimization in 2 weeks.   The patient is overall high risk for intermediate risk procedure. Factors contributing to risk include uncontrolled hypertension, baseline renal dysfunction, current smoking, poor functional capacity, and probable prior anterior infarct. However, no ischemia on stress test, therefore no further cardiovascular testing is required prior to the procedure. We will attempt to optimize blood pressure in the perioperative period, however suspect risk level is largely non-modifiable prior to surgery. Risk level should be discussed between patient and surgical team prior to proceeding."   Echo 07/08/21: IMPRESSIONS   1. Left ventricular ejection fraction, by estimation, is 50 to 55%. The  left ventricle has low  normal function. The left ventricle demonstrates  regional wall motion abnormalities (see scoring diagram/findings for  description). There is mild concentric  left ventricular hypertrophy. Left ventricular diastolic parameters were  normal. There is mild hypokinesis of the left ventricular, basal-mid  anterior wall.   2. Right ventricular systolic function is normal. The right ventricular  size is normal.   3. The mitral valve is normal in structure. Trivial mitral valve  regurgitation. No evidence of mitral stenosis.   4. The aortic valve is tricuspid. Aortic valve regurgitation is not  visualized. No aortic stenosis is present.   5. The inferior vena cava is normal in size with greater than 50%  respiratory variability, suggesting right atrial pressure of 3 mmHg.  - Comparison(s): Prior images unable to be directly viewed, comparison made  by report only. Changes from prior study are noted.  - Conclusion(s)/Recommendation(s): Low normal EF, but there is a focal wall  motion abnormalities in the basal to mid anterior wall. No prior images  available but this is not noted on prior report. Findings communicated to  Dr. Margaretann Loveless.    US Carotid 05/19/21: Summary:  - Right Carotid: Velocities in the  right ICA are consistent with a 80-99% stenosis.  - Left Carotid: Velocities in the left ICA are consistent with a 40-59% stenosis.  - Vertebrals:  Right Vertebral stenotic. Left Vertebral normal limits.  - Subclavians: Right subclavian artery was stenotic. Normal flow hemodynamics were seen in the left subclavian artery.    Past Medical History:  Diagnosis Date   Cancer [facility])    melanoma   Carotid artery occlusion    Cervical spondylosis with myelopathy    Arthritis   COPD (chronic obstructive pulmonary disease) ([place]) 2022   Diabetes mellitus    Type II   Hyperlipidemia    Hypertension    Impotence of organic origin    Insomnia    Peripheral arterial disease (HCC)    Restless leg  syndrome    Sleep apnea     Past Surgical History:  Procedure Laterality Date   APPENDECTOMY     years ago   EYE SURGERY  2017   cataracts   spinal fusion  09/12/1998   SPINE SURGERY  09/12/1998   Cervical Myelopathy- S/P fusion    MEDICATIONS:  acetaminophen (TYLENOL) 500 MG tablet   amLODipine (NORVASC) 10 MG tablet   aspirin 81 MG tablet   cilostazol (PLETAL) 100 MG tablet   clopidogrel (PLAVIX) 75 MG tablet   diphenhydrAMINE (BENADRYL) 25 MG tablet   doxazosin (CARDURA) 4 MG tablet   famotidine (PEPCID) 20 MG tablet   fluticasone (FLONASE) 50 MCG/ACT nasal spray   lisinopril (PRINIVIL,ZESTRIL) 40 MG tablet   metFORMIN (GLUCOPHAGE) 1000 MG tablet   metoprolol succinate (TOPROL-XL) 50 MG 24 hr tablet   Multiple Vitamin (MULTIVITAMIN WITH MINERALS) TABS tablet   polyvinyl alcohol (LIQUIFILM TEARS) 1.4 % ophthalmic solution   rosuvastatin (CRESTOR) 20 MG tablet   SPIRIVA HANDIHALER 18 MCG inhalation capsule   traZODone (DESYREL) 100 MG tablet   No current facility-administered medications for this encounter.    Myra Gianotti, PA-C Surgical Short Stay/Anesthesiology [facility] Phone [phone] [facility] Phone [phone] 08/10/2021 5:28 PM

## 2021-08-10 NOTE — Assessment & Plan Note (Signed)
Most recent draw was earlier this month, however LDL did not post with other readings in [place].  We will obtain this information and when he comes back in January to discuss hypertension, we can then discuss lipid lowering options.

## 2021-08-11 ENCOUNTER — Encounter (HOSPITAL_COMMUNITY): Payer: Self-pay

## 2021-08-12 ENCOUNTER — Ambulatory Visit: Payer: Medicare Other | Admitting: General Practice

## 2021-08-12 DIAGNOSIS — Z9289 Personal history of other medical treatment: Secondary | ICD-10-CM

## 2021-08-12 DIAGNOSIS — Z20822 Contact with and (suspected) exposure to covid-19: Secondary | ICD-10-CM | POA: Diagnosis not present

## 2021-08-12 HISTORY — DX: Personal history of other medical treatment: Z92.89

## 2021-08-13 ENCOUNTER — Encounter (HOSPITAL_COMMUNITY): Payer: Self-pay | Admitting: Vascular Surgery

## 2021-08-13 ENCOUNTER — Inpatient Hospital Stay (HOSPITAL_COMMUNITY)
Admission: RE | Admit: 2021-08-13 | Discharge: 2021-08-14 | DRG: 036 | Disposition: A | Discharge status: Home / Self Care | Payer: Medicare Other | Attending: Vascular Surgery | Admitting: Vascular Surgery

## 2021-08-13 ENCOUNTER — Inpatient Hospital Stay (HOSPITAL_COMMUNITY): Payer: Medicare Other | Admitting: Vascular Surgery

## 2021-08-13 ENCOUNTER — Other Ambulatory Visit: Payer: Self-pay

## 2021-08-13 ENCOUNTER — Encounter (HOSPITAL_COMMUNITY)
Admission: RE | Disposition: A | Discharge status: Home / Self Care | Payer: Self-pay | Source: Home / Self Care | Attending: Vascular Surgery

## 2021-08-13 ENCOUNTER — Inpatient Hospital Stay (HOSPITAL_COMMUNITY): Payer: Medicare Other

## 2021-08-13 ENCOUNTER — Inpatient Hospital Stay (HOSPITAL_COMMUNITY): Payer: Medicare Other | Admitting: Physician Assistant

## 2021-08-13 DIAGNOSIS — F17210 Nicotine dependence, cigarettes, uncomplicated: Secondary | ICD-10-CM | POA: Diagnosis present

## 2021-08-13 DIAGNOSIS — E785 Hyperlipidemia, unspecified: Secondary | ICD-10-CM | POA: Diagnosis not present

## 2021-08-13 DIAGNOSIS — N183 Chronic kidney disease, stage 3 unspecified: Secondary | ICD-10-CM | POA: Diagnosis present

## 2021-08-13 DIAGNOSIS — Z981 Arthrodesis status: Secondary | ICD-10-CM | POA: Diagnosis not present

## 2021-08-13 DIAGNOSIS — Z20822 Contact with and (suspected) exposure to covid-19: Secondary | ICD-10-CM | POA: Diagnosis not present

## 2021-08-13 DIAGNOSIS — Z7982 Long term (current) use of aspirin: Secondary | ICD-10-CM

## 2021-08-13 DIAGNOSIS — J449 Chronic obstructive pulmonary disease, unspecified: Secondary | ICD-10-CM | POA: Diagnosis not present

## 2021-08-13 DIAGNOSIS — I10 Essential (primary) hypertension: Secondary | ICD-10-CM | POA: Diagnosis not present

## 2021-08-13 DIAGNOSIS — K219 Gastro-esophageal reflux disease without esophagitis: Secondary | ICD-10-CM | POA: Diagnosis present

## 2021-08-13 DIAGNOSIS — G2581 Restless legs syndrome: Secondary | ICD-10-CM | POA: Diagnosis present

## 2021-08-13 DIAGNOSIS — R490 Dysphonia: Secondary | ICD-10-CM | POA: Diagnosis not present

## 2021-08-13 DIAGNOSIS — I6521 Occlusion and stenosis of right carotid artery: Principal | ICD-10-CM | POA: Diagnosis present

## 2021-08-13 DIAGNOSIS — Z006 Encounter for examination for normal comparison and control in clinical research program: Secondary | ICD-10-CM

## 2021-08-13 DIAGNOSIS — Z79899 Other long term (current) drug therapy: Secondary | ICD-10-CM | POA: Diagnosis not present

## 2021-08-13 DIAGNOSIS — E1122 Type 2 diabetes mellitus with diabetic chronic kidney disease: Secondary | ICD-10-CM | POA: Diagnosis not present

## 2021-08-13 DIAGNOSIS — I129 Hypertensive chronic kidney disease with stage 1 through stage 4 chronic kidney disease, or unspecified chronic kidney disease: Secondary | ICD-10-CM | POA: Diagnosis present

## 2021-08-13 DIAGNOSIS — Z7984 Long term (current) use of oral hypoglycemic drugs: Secondary | ICD-10-CM | POA: Diagnosis not present

## 2021-08-13 DIAGNOSIS — E1151 Type 2 diabetes mellitus with diabetic peripheral angiopathy without gangrene: Secondary | ICD-10-CM | POA: Diagnosis present

## 2021-08-13 DIAGNOSIS — G4730 Sleep apnea, unspecified: Secondary | ICD-10-CM | POA: Diagnosis present

## 2021-08-13 DIAGNOSIS — Z8249 Family history of ischemic heart disease and other diseases of the circulatory system: Secondary | ICD-10-CM

## 2021-08-13 DIAGNOSIS — Z85820 Personal history of malignant melanoma of skin: Secondary | ICD-10-CM

## 2021-08-13 HISTORY — PX: ULTRASOUND GUIDANCE FOR VASCULAR ACCESS: SHX6516

## 2021-08-13 HISTORY — PX: TRANSCAROTID ARTERY REVASCULARIZATIONÂ: SHX6778

## 2021-08-13 LAB — GLUCOSE, CAPILLARY
Glucose-Capillary: 146 mg/dL — ABNORMAL HIGH (ref 70–99)
Glucose-Capillary: 144 mg/dL — ABNORMAL HIGH (ref 70–99)
Glucose-Capillary: 220 mg/dL — ABNORMAL HIGH (ref 70–99)
Glucose-Capillary: 246 mg/dL — ABNORMAL HIGH (ref 70–99)

## 2021-08-13 LAB — SARS CORONAVIRUS 2 BY RT PCR (HOSPITAL ORDER, PERFORMED IN ~~LOC~~ HOSPITAL LAB): SARS Coronavirus 2: NEGATIVE

## 2021-08-13 SURGERY — TRANSCAROTID ARTERY REVASCULARIZATION (TCAR)
Anesthesia: General | Site: Neck | Laterality: Right

## 2021-08-13 MED ORDER — PHENYLEPHRINE 40 MCG/ML (10ML) SYRINGE FOR IV PUSH (FOR BLOOD PRESSURE SUPPORT)
PREFILLED_SYRINGE | INTRAVENOUS | Status: DC | PRN
  Administered 2021-08-13: 120 ug via INTRAVENOUS

## 2021-08-13 MED ORDER — CHLORHEXIDINE GLUCONATE 0.12 % MT SOLN
15.0000 mL | Freq: Once | OROMUCOSAL | Status: AC
  Administered 2021-08-13: 15 mL via OROMUCOSAL
  Filled 2021-08-13: qty 15

## 2021-08-13 MED ORDER — ROCURONIUM BROMIDE 10 MG/ML (PF) SYRINGE
PREFILLED_SYRINGE | INTRAVENOUS | Status: DC | PRN
  Administered 2021-08-13: 60 mg via INTRAVENOUS

## 2021-08-13 MED ORDER — FENTANYL CITRATE (PF) 100 MCG/2ML IJ SOLN
INTRAMUSCULAR | Status: AC
  Filled 2021-08-13: qty 2

## 2021-08-13 MED ORDER — EPHEDRINE 5 MG/ML INJ
INTRAVENOUS | Status: AC
  Filled 2021-08-13: qty 5

## 2021-08-13 MED ORDER — ROSUVASTATIN CALCIUM 20 MG PO TABS
20.0000 mg | ORAL_TABLET | Freq: Every day | ORAL | Status: DC
  Administered 2021-08-13 – 2021-08-14 (×2): 20 mg via ORAL
  Filled 2021-08-13 (×2): qty 1

## 2021-08-13 MED ORDER — HYDROMORPHONE HCL 1 MG/ML IJ SOLN
INTRAMUSCULAR | Status: AC
  Filled 2021-08-13: qty 1

## 2021-08-13 MED ORDER — TIOTROPIUM BROMIDE MONOHYDRATE 18 MCG IN CAPS: 18.0000 ug | ORAL_CAPSULE | Freq: Every day | RESPIRATORY_TRACT | Status: DC

## 2021-08-13 MED ORDER — MORPHINE SULFATE (PF) 2 MG/ML IV SOLN
2.0000 mg | Freq: Once | INTRAVENOUS | Status: AC
  Administered 2021-08-13: 2 mg via INTRAVENOUS

## 2021-08-13 MED ORDER — CLOPIDOGREL BISULFATE 75 MG PO TABS
75.0000 mg | ORAL_TABLET | Freq: Every day | ORAL | Status: DC
  Administered 2021-08-14: 75 mg via ORAL
  Filled 2021-08-13: qty 1

## 2021-08-13 MED ORDER — LIDOCAINE 2% (20 MG/ML) 5 ML SYRINGE
INTRAMUSCULAR | Status: AC
  Filled 2021-08-13: qty 5

## 2021-08-13 MED ORDER — LABETALOL HCL 5 MG/ML IV SOLN: 10.0000 mg | INTRAVENOUS | Status: DC | PRN

## 2021-08-13 MED ORDER — UMECLIDINIUM BROMIDE 62.5 MCG/ACT IN AEPB
1.0000 | INHALATION_SPRAY | Freq: Every day | RESPIRATORY_TRACT | Status: DC
  Administered 2021-08-14: 1 via RESPIRATORY_TRACT
  Filled 2021-08-13: qty 7

## 2021-08-13 MED ORDER — TRAZODONE HCL 100 MG PO TABS
100.0000 mg | ORAL_TABLET | Freq: Every day | ORAL | Status: DC
  Administered 2021-08-13: 100 mg via ORAL
  Filled 2021-08-13: qty 1

## 2021-08-13 MED ORDER — PROTAMINE SULFATE 10 MG/ML IV SOLN
INTRAVENOUS | Status: DC | PRN
  Administered 2021-08-13: 50 mg via INTRAVENOUS

## 2021-08-13 MED ORDER — MAGNESIUM SULFATE 2 GM/50ML IV SOLN: 2.0000 g | Freq: Every day | INTRAVENOUS | Status: DC | PRN

## 2021-08-13 MED ORDER — HEPARIN 6000 UNIT IRRIGATION SOLUTION
Status: DC | PRN
  Administered 2021-08-13: 1

## 2021-08-13 MED ORDER — ROCURONIUM BROMIDE 10 MG/ML (PF) SYRINGE
PREFILLED_SYRINGE | INTRAVENOUS | Status: AC
  Filled 2021-08-13: qty 10

## 2021-08-13 MED ORDER — GLYCOPYRROLATE PF 0.2 MG/ML IJ SOSY
PREFILLED_SYRINGE | INTRAMUSCULAR | Status: AC
  Filled 2021-08-13: qty 2

## 2021-08-13 MED ORDER — MIDAZOLAM HCL 2 MG/2ML IJ SOLN
INTRAMUSCULAR | Status: AC
  Filled 2021-08-13: qty 2

## 2021-08-13 MED ORDER — 0.9 % SODIUM CHLORIDE (POUR BTL) OPTIME
TOPICAL | Status: DC | PRN
  Administered 2021-08-13: 1000 mL

## 2021-08-13 MED ORDER — FENTANYL CITRATE (PF) 250 MCG/5ML IJ SOLN
INTRAMUSCULAR | Status: DC | PRN
  Administered 2021-08-13: 50 ug via INTRAVENOUS

## 2021-08-13 MED ORDER — HEMOSTATIC AGENTS (NO CHARGE) OPTIME
TOPICAL | Status: DC | PRN
  Administered 2021-08-13: 1 via TOPICAL

## 2021-08-13 MED ORDER — ADULT MULTIVITAMIN W/MINERALS CH
1.0000 | ORAL_TABLET | Freq: Every day | ORAL | Status: DC
  Administered 2021-08-13 – 2021-08-14 (×2): 1 via ORAL
  Filled 2021-08-13 (×2): qty 1

## 2021-08-13 MED ORDER — ONDANSETRON HCL 4 MG/2ML IJ SOLN
INTRAMUSCULAR | Status: DC | PRN
  Administered 2021-08-13: 4 mg via INTRAVENOUS

## 2021-08-13 MED ORDER — DOCUSATE SODIUM 100 MG PO CAPS
100.0000 mg | ORAL_CAPSULE | Freq: Every day | ORAL | Status: DC
  Administered 2021-08-14: 100 mg via ORAL
  Filled 2021-08-13: qty 1

## 2021-08-13 MED ORDER — CEFAZOLIN SODIUM-DEXTROSE 2-4 GM/100ML-% IV SOLN
2.0000 g | Freq: Three times a day (TID) | INTRAVENOUS | Status: AC
  Administered 2021-08-13 (×2): 2 g via INTRAVENOUS
  Filled 2021-08-13 (×2): qty 100

## 2021-08-13 MED ORDER — GLYCOPYRROLATE PF 0.2 MG/ML IJ SOSY
PREFILLED_SYRINGE | INTRAMUSCULAR | Status: DC | PRN
  Administered 2021-08-13 (×2): .2 mg via INTRAVENOUS

## 2021-08-13 MED ORDER — EPHEDRINE SULFATE-NACL 50-0.9 MG/10ML-% IV SOSY
PREFILLED_SYRINGE | INTRAVENOUS | Status: DC | PRN
  Administered 2021-08-13: 25 mg via INTRAVENOUS

## 2021-08-13 MED ORDER — FLUTICASONE PROPIONATE 50 MCG/ACT NA SUSP
1.0000 | Freq: Every day | NASAL | Status: DC
Start: 2021-08-13 — End: 2021-08-13

## 2021-08-13 MED ORDER — LACTATED RINGERS IV SOLN: INTRAVENOUS | Status: DC

## 2021-08-13 MED ORDER — POTASSIUM CHLORIDE CRYS ER 20 MEQ PO TBCR
20.0000 meq | EXTENDED_RELEASE_TABLET | Freq: Every day | ORAL | Status: DC | PRN
Start: 2021-08-13 — End: 2021-08-14

## 2021-08-13 MED ORDER — CHLORHEXIDINE GLUCONATE CLOTH 2 % EX PADS: 6.0000 | MEDICATED_PAD | Freq: Once | CUTANEOUS | Status: DC

## 2021-08-13 MED ORDER — INSULIN ASPART 100 UNIT/ML IJ SOLN
0.0000 [IU] | Freq: Three times a day (TID) | INTRAMUSCULAR | Status: DC
  Administered 2021-08-13: 3 [IU] via SUBCUTANEOUS

## 2021-08-13 MED ORDER — ACETAMINOPHEN 325 MG PO TABS: 325.0000 mg | ORAL_TABLET | ORAL | Status: DC | PRN

## 2021-08-13 MED ORDER — LIDOCAINE 2% (20 MG/ML) 5 ML SYRINGE
INTRAMUSCULAR | Status: DC | PRN
  Administered 2021-08-13: 80 mg via INTRAVENOUS

## 2021-08-13 MED ORDER — ASPIRIN EC 81 MG PO TBEC
81.0000 mg | DELAYED_RELEASE_TABLET | Freq: Every day | ORAL | Status: DC
  Administered 2021-08-14: 81 mg via ORAL
  Filled 2021-08-13: qty 1

## 2021-08-13 MED ORDER — METOPROLOL TARTRATE 5 MG/5ML IV SOLN: 2.0000 mg | INTRAVENOUS | Status: DC | PRN

## 2021-08-13 MED ORDER — FAMOTIDINE 20 MG PO TABS
20.0000 mg | ORAL_TABLET | Freq: Every day | ORAL | Status: DC
  Administered 2021-08-14: 20 mg via ORAL
  Filled 2021-08-13: qty 1

## 2021-08-13 MED ORDER — HEPARIN SODIUM (PORCINE) 1000 UNIT/ML IJ SOLN
INTRAMUSCULAR | Status: DC | PRN
  Administered 2021-08-13: 10000 [IU] via INTRAVENOUS

## 2021-08-13 MED ORDER — PHENYLEPHRINE HCL-NACL 20-0.9 MG/250ML-% IV SOLN
INTRAVENOUS | Status: DC | PRN
Start: 2021-08-13 — End: 2021-08-13
  Administered 2021-08-13: 20 ug/min via INTRAVENOUS

## 2021-08-13 MED ORDER — PROPOFOL 10 MG/ML IV BOLUS
INTRAVENOUS | Status: DC | PRN
  Administered 2021-08-13: 150 mg via INTRAVENOUS

## 2021-08-13 MED ORDER — LABETALOL HCL 5 MG/ML IV SOLN
INTRAVENOUS | Status: AC
  Filled 2021-08-13: qty 4

## 2021-08-13 MED ORDER — POLYVINYL ALCOHOL 1.4 % OP SOLN: 1.0000 [drp] | OPHTHALMIC | Status: DC | PRN

## 2021-08-13 MED ORDER — ORAL CARE MOUTH RINSE: 15.0000 mL | Freq: Once | OROMUCOSAL | Status: AC

## 2021-08-13 MED ORDER — DOXAZOSIN MESYLATE 4 MG PO TABS
4.0000 mg | ORAL_TABLET | Freq: Every day | ORAL | Status: DC
  Administered 2021-08-13 – 2021-08-14 (×2): 4 mg via ORAL
  Filled 2021-08-13 (×2): qty 1

## 2021-08-13 MED ORDER — HYDRALAZINE HCL 20 MG/ML IJ SOLN
INTRAMUSCULAR | Status: AC
  Administered 2021-08-13: 20 mg
  Filled 2021-08-13: qty 1

## 2021-08-13 MED ORDER — ACETAMINOPHEN 325 MG RE SUPP
325.0000 mg | RECTAL | Status: DC | PRN
  Filled 2021-08-13: qty 2

## 2021-08-13 MED ORDER — OXYCODONE-ACETAMINOPHEN 5-325 MG PO TABS
ORAL_TABLET | ORAL | Status: AC
  Filled 2021-08-13: qty 1

## 2021-08-13 MED ORDER — METOPROLOL SUCCINATE ER 50 MG PO TB24
50.0000 mg | ORAL_TABLET | Freq: Every day | ORAL | Status: DC
  Administered 2021-08-14: 50 mg via ORAL
  Filled 2021-08-13 (×2): qty 1

## 2021-08-13 MED ORDER — HEPARIN 6000 UNIT IRRIGATION SOLUTION
Status: AC
  Filled 2021-08-13: qty 500

## 2021-08-13 MED ORDER — ONDANSETRON HCL 4 MG/2ML IJ SOLN
4.0000 mg | Freq: Four times a day (QID) | INTRAMUSCULAR | Status: DC | PRN
  Administered 2021-08-13: 4 mg via INTRAVENOUS
  Filled 2021-08-13: qty 2

## 2021-08-13 MED ORDER — SODIUM CHLORIDE 0.9 % IV SOLN: INTRAVENOUS | Status: DC

## 2021-08-13 MED ORDER — OXYCODONE-ACETAMINOPHEN 5-325 MG PO TABS
1.0000 | ORAL_TABLET | ORAL | Status: DC | PRN
  Administered 2021-08-13: 1 via ORAL
  Filled 2021-08-13: qty 2

## 2021-08-13 MED ORDER — PHENOL 1.4 % MT LIQD: 1.0000 | OROMUCOSAL | Status: DC | PRN

## 2021-08-13 MED ORDER — GUAIFENESIN-DM 100-10 MG/5ML PO SYRP: 15.0000 mL | ORAL_SOLUTION | ORAL | Status: DC | PRN

## 2021-08-13 MED ORDER — SODIUM CHLORIDE 0.9 % IV SOLN: 500.0000 mL | Freq: Once | INTRAVENOUS | Status: DC | PRN

## 2021-08-13 MED ORDER — METOPROLOL SUCCINATE ER 25 MG PO TB24
50.0000 mg | ORAL_TABLET | Freq: Once | ORAL | Status: AC
  Administered 2021-08-13: 50 mg via ORAL
  Filled 2021-08-13: qty 2

## 2021-08-13 MED ORDER — IODIXANOL 320 MG/ML IV SOLN
INTRAVENOUS | Status: DC | PRN
  Administered 2021-08-13: 38 mL via INTRA_ARTERIAL

## 2021-08-13 MED ORDER — HYDRALAZINE HCL 20 MG/ML IJ SOLN
5.0000 mg | INTRAMUSCULAR | Status: AC | PRN
  Administered 2021-08-13 (×2): 5 mg via INTRAVENOUS
  Filled 2021-08-13 (×2): qty 0.25

## 2021-08-13 MED ORDER — MORPHINE SULFATE (PF) 2 MG/ML IV SOLN
INTRAVENOUS | Status: AC
  Filled 2021-08-13: qty 1

## 2021-08-13 MED ORDER — PROMETHAZINE HCL 25 MG/ML IJ SOLN: 6.2500 mg | INTRAMUSCULAR | Status: DC | PRN

## 2021-08-13 MED ORDER — PHENYLEPHRINE HCL-NACL 20-0.9 MG/250ML-% IV SOLN
INTRAVENOUS | Status: DC | PRN
  Administered 2021-08-13: 25 ug/min via INTRAVENOUS

## 2021-08-13 MED ORDER — DEXAMETHASONE SODIUM PHOSPHATE 10 MG/ML IJ SOLN
INTRAMUSCULAR | Status: DC | PRN
  Administered 2021-08-13: 8 mg via INTRAVENOUS

## 2021-08-13 MED ORDER — PROPOFOL 500 MG/50ML IV EMUL
INTRAVENOUS | Status: DC | PRN
  Administered 2021-08-13: 25 ug/kg/min via INTRAVENOUS

## 2021-08-13 MED ORDER — AMLODIPINE BESYLATE 10 MG PO TABS
10.0000 mg | ORAL_TABLET | Freq: Every day | ORAL | Status: DC
  Administered 2021-08-14: 10 mg via ORAL
  Filled 2021-08-13: qty 1

## 2021-08-13 MED ORDER — LABETALOL HCL 5 MG/ML IV SOLN
INTRAVENOUS | Status: DC | PRN
  Administered 2021-08-13: 5 mg via INTRAVENOUS

## 2021-08-13 MED ORDER — FLUTICASONE PROPIONATE 50 MCG/ACT NA SUSP
2.0000 | Freq: Every day | NASAL | Status: DC
  Administered 2021-08-14: 2 via NASAL
  Filled 2021-08-13: qty 16

## 2021-08-13 MED ORDER — ACETAMINOPHEN 500 MG PO TABS
1000.0000 mg | ORAL_TABLET | Freq: Once | ORAL | Status: AC
  Administered 2021-08-13: 1000 mg via ORAL
  Filled 2021-08-13: qty 2

## 2021-08-13 MED ORDER — HYDROMORPHONE HCL 1 MG/ML IJ SOLN
0.5000 mg | INTRAMUSCULAR | Status: DC | PRN
  Administered 2021-08-13: 0.5 mg via INTRAVENOUS

## 2021-08-13 MED ORDER — FENTANYL CITRATE (PF) 100 MCG/2ML IJ SOLN
25.0000 ug | INTRAMUSCULAR | Status: DC | PRN
  Administered 2021-08-13 (×3): 50 ug via INTRAVENOUS

## 2021-08-13 MED ORDER — LIDOCAINE HCL (PF) 1 % IJ SOLN
INTRAMUSCULAR | Status: AC
  Filled 2021-08-13: qty 30

## 2021-08-13 MED ORDER — CILOSTAZOL 100 MG PO TABS
100.0000 mg | ORAL_TABLET | Freq: Two times a day (BID) | ORAL | Status: DC
  Administered 2021-08-14: 100 mg via ORAL
  Filled 2021-08-13: qty 2
  Filled 2021-08-13 (×2): qty 1

## 2021-08-13 MED ORDER — BISACODYL 10 MG RE SUPP: 10.0000 mg | Freq: Every day | RECTAL | Status: DC | PRN

## 2021-08-13 MED ORDER — CEFAZOLIN SODIUM-DEXTROSE 2-4 GM/100ML-% IV SOLN
2.0000 g | INTRAVENOUS | Status: AC
  Administered 2021-08-13: 2 g via INTRAVENOUS
  Filled 2021-08-13: qty 100

## 2021-08-13 MED ORDER — DIPHENHYDRAMINE HCL 25 MG PO TABS
25.0000 mg | ORAL_TABLET | Freq: Two times a day (BID) | ORAL | Status: DC | PRN
  Filled 2021-08-13: qty 1

## 2021-08-13 MED ORDER — POLYETHYLENE GLYCOL 3350 17 G PO PACK: 17.0000 g | PACK | Freq: Every day | ORAL | Status: DC | PRN

## 2021-08-13 MED ORDER — FENTANYL CITRATE (PF) 250 MCG/5ML IJ SOLN
INTRAMUSCULAR | Status: AC
  Filled 2021-08-13: qty 5

## 2021-08-13 MED ORDER — SUGAMMADEX SODIUM 200 MG/2ML IV SOLN
INTRAVENOUS | Status: DC | PRN
  Administered 2021-08-13: 326.4 mg via INTRAVENOUS

## 2021-08-13 SURGICAL SUPPLY — 68 items
ADH SKN CLS APL DERMABOND .7 (GAUZE/BANDAGES/DRESSINGS) ×2
BAG BANDED W/RUBBER/TAPE 36X54 (MISCELLANEOUS) ×4 IMPLANT
BAG COUNTER SPONGE SURGICOUNT (BAG) ×3 IMPLANT
BAG EQP BAND 135X91 W/RBR TAPE (MISCELLANEOUS) ×2
BAG SPNG CNTER NS LX DISP (BAG) ×2
BAG SURGICOUNT SPONGE COUNTING (BAG) ×1
BALLN STERLING RX 5X30X80 (BALLOONS) ×4
BALLOON STERLING RX 5X30X80 (BALLOONS) IMPLANT
CANISTER SUCT 3000ML PPV (MISCELLANEOUS) ×4 IMPLANT
CATH BEACON 5 .035 40 KMP TP (CATHETERS) IMPLANT
CATH BEACON 5 .038 40 KMP TP (CATHETERS) ×4
CATH ROBINSON RED A/P 18FR (CATHETERS) IMPLANT
CLIP LIGATING EXTRA MED SLVR (CLIP) ×4 IMPLANT
CLIP LIGATING EXTRA SM BLUE (MISCELLANEOUS) ×4 IMPLANT
CLIP VESOCCLUDE MED 6/CT (CLIP) ×4 IMPLANT
CLIP VESOCCLUDE SM WIDE 6/CT (CLIP) ×4 IMPLANT
COVER DOME SNAP 22 D (MISCELLANEOUS) ×4 IMPLANT
COVER PROBE W GEL 5X96 (DRAPES) ×4 IMPLANT
DERMABOND ADVANCED (GAUZE/BANDAGES/DRESSINGS) ×2
DERMABOND ADVANCED .7 DNX12 (GAUZE/BANDAGES/DRESSINGS) ×2 IMPLANT
DRAPE FEMORAL ANGIO 80X135IN (DRAPES) ×4 IMPLANT
ELECT REM PT RETURN 9FT ADLT (ELECTROSURGICAL) ×4
ELECTRODE REM PT RTRN 9FT ADLT (ELECTROSURGICAL) ×2 IMPLANT
GLOVE SURG ENC MOIS LTX SZ7.5 (GLOVE) ×4 IMPLANT
GOWN STRL REUS W/ TWL LRG LVL3 (GOWN DISPOSABLE) ×4 IMPLANT
GOWN STRL REUS W/ TWL XL LVL3 (GOWN DISPOSABLE) ×2 IMPLANT
GOWN STRL REUS W/TWL LRG LVL3 (GOWN DISPOSABLE) ×8
GOWN STRL REUS W/TWL XL LVL3 (GOWN DISPOSABLE) ×4
GUIDEWIRE ENROUTE 0.014 (WIRE) ×4 IMPLANT
HEMOSTAT SNOW SURGICEL 2X4 (HEMOSTASIS) IMPLANT
INSERT FOGARTY SM (MISCELLANEOUS) IMPLANT
INTRODUCER KIT GALT 7CM (INTRODUCER) ×4
KIT BASIN OR (CUSTOM PROCEDURE TRAY) ×4 IMPLANT
KIT ENCORE 26 ADVANTAGE (KITS) ×4 IMPLANT
KIT INTRODUCER GALT 7 (INTRODUCER) ×2 IMPLANT
KIT TURNOVER KIT B (KITS) ×4 IMPLANT
NDL HYPO 25GX1X1/2 BEV (NEEDLE) IMPLANT
NEEDLE HYPO 25GX1X1/2 BEV (NEEDLE) IMPLANT
PACK CAROTID (CUSTOM PROCEDURE TRAY) ×4 IMPLANT
POSITIONER HEAD DONUT 9IN (MISCELLANEOUS) ×4 IMPLANT
POWDER SURGICEL 3.0 GRAM (HEMOSTASIS) ×2 IMPLANT
PROTECTION STATION PRESSURIZED (MISCELLANEOUS)
SET MICROPUNCTURE 5F STIFF (MISCELLANEOUS) IMPLANT
SHEATH AVANTI 11CM 5FR (SHEATH) IMPLANT
SHUNT CAROTID BYPASS 10 (VASCULAR PRODUCTS) IMPLANT
SHUNT CAROTID BYPASS 12FRX15.5 (VASCULAR PRODUCTS) IMPLANT
STATION PROTECTION PRESSURIZED (MISCELLANEOUS) IMPLANT
STENT TRANSCAROTID SYSTEM 9X40 (Permanent Stent) ×2 IMPLANT
SUT MNCRL AB 4-0 PS2 18 (SUTURE) ×4 IMPLANT
SUT PROLENE 5 0 C 1 24 (SUTURE) ×4 IMPLANT
SUT PROLENE 6 0 BV (SUTURE) IMPLANT
SUT PROLENE 7 0 BV 1 (SUTURE) IMPLANT
SUT SILK 2 0 PERMA HAND 18 BK (SUTURE) ×4 IMPLANT
SUT SILK 2 0 SH CR/8 (SUTURE) ×4 IMPLANT
SUT SILK 3 0 (SUTURE)
SUT SILK 3-0 18XBRD TIE 12 (SUTURE) IMPLANT
SUT VIC AB 3-0 SH 27 (SUTURE) ×4
SUT VIC AB 3-0 SH 27X BRD (SUTURE) ×2 IMPLANT
SYR 10ML LL (SYRINGE) ×12 IMPLANT
SYR 20ML LL LF (SYRINGE) ×4 IMPLANT
SYR CONTROL 10ML LL (SYRINGE) IMPLANT
SYSTEM TRANSCAROTID NEUROPRTCT (MISCELLANEOUS) ×2 IMPLANT
TOWEL GREEN STERILE (TOWEL DISPOSABLE) ×4 IMPLANT
TRANSCAROTID NEUROPROTECT SYS (MISCELLANEOUS) ×4
TUBING ART PRESS 48 MALE/FEM (TUBING) IMPLANT
TUBING EXTENTION W/L.L. (IV SETS) IMPLANT
WATER STERILE IRR 1000ML POUR (IV SOLUTION) ×4 IMPLANT
WIRE BENTSON .035X145CM (WIRE) ×4 IMPLANT

## 2021-08-13 NOTE — H&P (Signed)
HPI:   Raymond [name] is a 60 y.o. male history of claudication and right ICA stenosis.  Continues to deny stroke, TIA or amaurosis.  He is able to walk as far as he wants.  He is on aspirin and a statin he does not take other antiplatelet agents or blood thinners.  He denies any tissue loss or ulceration in his feet.  He continues to smoke daily.        Past Medical History:  Diagnosis Date   Cancer ([place])      melanoma   Carotid artery occlusion     Cervical spondylosis with myelopathy      Arthritis   Diabetes mellitus      Type II   Hyperlipidemia     Hypertension     Impotence of organic origin     Insomnia     Peripheral arterial disease (HCC)     Restless leg syndrome           Family History  Problem Relation Age of Onset   Cancer Mother     Hypertension Father     Hypertension Sister           Past Surgical History:  Procedure Laterality Date   spinal fusion   2000   SPINE SURGERY   2000    Cervical Myelopathy- S/P fusion      Short Social History:  Social History         Tobacco Use   Smoking status: Every Day      Packs/day: 1.00      Types: Cigarettes   Smokeless tobacco: Never   Tobacco comments:      smokes about 3 cigs in last month  Substance Use Topics   Alcohol use: Yes      Alcohol/week: 2.0 standard drinks      Types: 2 Cans of beer per week      Comment: 2 cans per month      No Known Allergies         Current Outpatient Medications  Medication Sig Dispense Refill   acetaminophen (TYLENOL) 100 MG/ML solution Take 10 mg/kg by mouth every 4 (four) hours as needed.       amLODipine (NORVASC) 10 MG tablet Take 10 mg by mouth daily.       aspirin 81 MG tablet Take 81 mg by mouth daily.       cilostazol (PLETAL) 100 MG tablet Take 1 tablet (100 mg total) by mouth 2 (two) times daily before a meal. 180 tablet 3   fluticasone (FLONASE) 50 MCG/ACT nasal spray         lisinopril (PRINIVIL,ZESTRIL) 40 MG tablet Take 40 mg by mouth 2  (two) times daily.        metFORMIN (GLUCOPHAGE) 1000 MG tablet         metoprolol succinate (TOPROL-XL) 50 MG 24 hr tablet         pravastatin (PRAVACHOL) 40 MG tablet         SPIRIVA HANDIHALER 18 MCG inhalation capsule         traZODone (DESYREL) 100 MG tablet Take 100 mg by mouth at bedtime.        No current facility-administered medications for this visit.      Review of Systems  Constitutional:  Constitutional negative. HENT: HENT negative.  Eyes: Eyes negative.  Cardiovascular: Positive for claudication.  GI: Gastrointestinal negative.  Skin: Skin negative.  Neurological: Neurological  negative. Hematologic: Positive for bruises/bleeds easily.  Psychiatric: Psychiatric negative.         Objective:    Vitals:   08/13/21 0556 08/13/21 0614  BP: (!) 191/62 (!) 189/74  Pulse: 90 74  Resp: 17   Temp: 98.2 F (36.8 C)   SpO2: 98%       Physical Exam Constitutional:      Appearance: Normal appearance.  HENT:     Head: Normocephalic.     Nose:     Comments: Wearing a mask Eyes:     Pupils: Pupils are equal, round, and reactive to light.  Neck:     Vascular: Carotid bruit present.  Cardiovascular:     Rate and Rhythm: Normal rate.     Heart sounds: Murmur heard.  Pulmonary:     Effort: Pulmonary effort is normal.  Abdominal:     General: Abdomen is flat.     Palpations: Abdomen is soft. There is no mass.  Musculoskeletal:        General: Normal range of motion.     Right lower leg: No edema.     Left lower leg: No edema.  Skin:    General: Skin is warm and dry.     Capillary Refill: Capillary refill takes less than 2 seconds.  Neurological:     General: No focal deficit present.     Mental Status: He is alert.  Psychiatric:        Mood and Affect: Mood normal.      Data: CTA neck IMPRESSION: 1. 80% proximal right ICA stenosis. 2. Moderate left-sided cervical carotid atherosclerosis without significant stenosis. 3. Advanced intracranial  atherosclerosis with moderate to severe anterior and posterior circulation stenoses as detailed above. 4. No evidence of acute intracranial abnormality. 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 5. Aortic Atherosclerosis (ICD10-I70.0).      Assessment/Plan:    60 year old male with high-grade asymptomatic right ICA stenosis.  He is here today for right transcarotid artery stenting.  He has been cleared by cardiology.  He continues on dual antiplatelet therapy and a statin.  He has had no neurologic symptoms since his clinic evaluation.  He has no complaints today.  All of his questions were answered.  We again discussed the risk and benefits and he demonstrates good understanding.  His wife is in the waiting room.      Thijs Brunton C. Donzetta Matters, MD [facility] Office: [phone] Pager: [phone]

## 2021-08-13 NOTE — Anesthesia Procedure Notes (Signed)
Arterial Line Insertion Start/End12/10/2020 7:00 AM Performed by: Minerva Ends, CRNA, CRNA  Preanesthetic checklist: patient identified, IV checked, site marked, risks and benefits discussed, surgical consent, monitors and equipment checked, pre-op evaluation and timeout performed Lidocaine 1% used for infiltration Left, radial was placed Catheter size: 20 G Hand hygiene performed , maximum sterile barriers used  and Seldinger technique used Allen's test indicative of satisfactory collateral circulation Attempts: 1 Procedure performed without using ultrasound guided technique. Following insertion, dressing applied and Biopatch. Post procedure assessment: normal  Patient tolerated the procedure well with no immediate complications.

## 2021-08-13 NOTE — Transfer of Care (Signed)
Immediate Anesthesia Transfer of Care Note  Patient: BLAYTON HUTTNER  Procedure(s) Performed: RIGHT TRANSCAROTID ARTERY REVASCULARIZATION (Right: Neck) ULTRASOUND GUIDANCE FOR VASCULAR ACCESS (Left: Groin)  Patient Location: PACU  Anesthesia Type:General  Level of Consciousness: awake, alert  and oriented  Airway & Oxygen Therapy: Patient Spontanous Breathing  Post-op Assessment: Report given to RN, Post -op Vital signs reviewed and stable and Patient moving all extremities X 4  Post vital signs: Reviewed and stable  Last Vitals:  Vitals Value Taken Time  BP 152/55 08/13/21 0912  Temp    Pulse 80 08/13/21 0915  Resp 20 08/13/21 0916  SpO2 91 % 08/13/21 0915  Vitals shown include unvalidated device data.  Last Pain:  Vitals:   08/13/21 0604  TempSrc:   PainSc: 0-No pain      Patients Stated Pain Goal: 0 (73/53/29 [id number])  Complications: No notable events documented.

## 2021-08-13 NOTE — Discharge Instructions (Signed)
   [facility]  Discharge Instructions   Carotid Surgery  Please refer to the following instructions for your post-procedure care. Your surgeon or physician assistant will discuss any changes with you.  Activity  You are encouraged to walk as much as you can. You can slowly return to normal activities but must avoid strenuous activity and heavy lifting until your doctor tell you it's okay. Avoid activities such as vacuuming or swinging a golf club. You can drive after one week if you are comfortable and you are no longer taking prescription pain medications. It is normal to feel tired for serval weeks after your surgery. It is also normal to have difficulty with sleep habits, eating, and bowel movements after surgery. These will go away with time.  Bathing/Showering  Shower daily after you go home. Do not soak in a bathtub, hot tub, or swim until the incision heals completely.  Incision Care  Shower every day. Clean your incision with mild soap and water. Pat the area dry with a clean towel. You do not need a bandage unless otherwise instructed. Do not apply any ointments or creams to your incision. You may have skin glue on your incision. Do not peel it off. It will come off on its own in about one week. Your incision may feel thickened and raised for several weeks after your surgery. This is normal and the skin will soften over time.   For Men Only: It's okay to shave around the incision but do not shave the incision itself for 2 weeks. It is common to have numbness under your chin that could last for several months.  Diet  Resume your normal diet. There are no special food restrictions following this procedure. A low fat/low cholesterol diet is recommended for all patients with vascular disease. In order to heal from your surgery, it is CRITICAL to get adequate nutrition. Your body requires vitamins, minerals, and protein. Vegetables are the best source of  vitamins and minerals. Vegetables also provide the perfect balance of protein. Processed food has little nutritional value, so try to avoid this.  Medications  Resume taking all of your medications unless your doctor or physician assistant tells you not to. If your incision is causing pain, you may take over-the- counter pain relievers such as acetaminophen (Tylenol). If you were prescribed a stronger pain medication, please be aware these medications can cause nausea and constipation. Prevent nausea by taking the medication with a snack or meal. Avoid constipation by drinking plenty of fluids and eating foods with a high amount of fiber, such as fruits, vegetables, and grains.   Do not take Tylenol if you are taking prescription pain medications.  Follow Up  Our office will schedule a follow up appointment 2-3 weeks following discharge.  Please call us immediately for any of the following conditions  . Increased pain, redness, drainage (pus) from your incision site. . Fever of 101 degrees or higher. . If you should develop stroke (slurred speech, difficulty swallowing, weakness on one side of your body, loss of vision) you should call 911 and go to the nearest emergency room. .  Reduce your risk of vascular disease:  . Stop smoking. If you would like help call [facility] at 1-800-QUIT-NOW (1-800-784-8669) or [place] at 336-586-4000. . Manage your cholesterol . Maintain a desired weight . Control your diabetes . Keep your blood pressure down .  If you have any questions, please call the office at [phone]. 

## 2021-08-13 NOTE — Op Note (Signed)
Patient name: Raymond [name] MRN: [id number] DOB: Jan 30, 1961 Sex: male  08/13/2021 Pre-operative Diagnosis: Asymptomatic right ICA stenosis Post-operative diagnosis:  Same Surgeon:  Erlene Quan C. Donzetta Matters, MD Assistant: Leontine Locket, PA Procedure Performed: 1.  Right transcarotid internal carotid artery stenting with 9 x 40 mm EnRoute stent and flow reversal neuro protection 2.  Ultrasound-guided cannulation left common femoral vein for flow reversal  Indications: 60 year old male with asymptomatic right ICA stenosis.  He has been worked up for endarterectomy versus medical therapy versus stenting we have elected for stenting.  He is cleared cardiology clearance and he is indicated for transcarotid stenting.   An assistant was necessary to facilitate exposure and expedite the case.  Findings: The ICA was visualized its deep LAO as its best projection.  There was greater than 80% stenosis.  At completion there is less than 20% residual stenosis.  Patient was neurologically intact upon awakening from anesthesia.   Procedure:  The patient was identified in the holding area and taken to the operating was placed in bilateral table and general anesthesia was induced.  Sterilely prepped draped in the neck and bilateral groins in usual fashion, antibiotics were ministered a timeout was called.  Ultrasound was used to identify the left common femoral vein this was cannulated with micropuncture needle followed by wire sheath.  Bentson wires placed followed by an 8 [place] with flow reversal sheath.  We turned our attention to the neck.  I also used ultrasound to identify the common carotid artery between the 2 heads of the sternocleidomastoid.  A vertical incision was made.  Dissected down to the avascular plane.  The IJ was retracted laterally.  The vagus was identified and protected.  The common carotid artery was identified and 2000's of heparin was administered.  I encircled the common carotid artery with  vessel loop as well as umbilical tape.  5-0 Prolene U stitch was placed.  The common carotid artery was cannulated micropuncture needle followed by wire to 3 cm in sheath 3 cm.  Angiography was performed.  We placed a stiff J-wire to the level of the bifurcation placed a flow reversal sheath.  This was sutured to the skin.  Flow reversal was initiated passively.  ACT was returned greater than 260.  Blood pressure was systolic 811.  TCAR timeout was performed.,  Carotid arteries clamped.  I used a Berenstein catheter to cross with an 014 wire this took the usual anatomy of the ICA.  We predilated the lesion with 5 mm balloon and primarily stented with a 9 x 40 stent.  We allow 2 minutes of washout.  Completion angiography in 2 views demonstrated less than 20% residual stenosis.  The wire was removed.  The clamp was removed.  Flow reversal was disconnected.  Sheath was pulled the left groin pressure held till hemostasis obtained.  We removed the sheath in the common carotid artery and cinched our U stitch.  There was good Doppler flow after this.  50 mg of protamine was administered.  We obtain hemostasis.  The neck wound was closed in layers with Vicryl and Monocryl.  Dermabond is placed at the skin level.  He was awakened from anesthesia having tolerated procedure well without immediate complication.  He was noted to be neurologically intact upon completion and was transferred to the recovery area in stable condition.  All counts were correct at completion.  EBL: 50 cc  Contrast: 38 point cc    Woodson Macha C. Donzetta Matters, MD [facility] [place] Office: [phone] Pager: [phone]

## 2021-08-13 NOTE — Anesthesia Postprocedure Evaluation (Signed)
Anesthesia Post Note  Patient: Raymond [name]  Procedure(s) Performed: RIGHT TRANSCAROTID ARTERY REVASCULARIZATION (Right: Neck) ULTRASOUND GUIDANCE FOR VASCULAR ACCESS (Left: Groin)     Patient location during evaluation: PACU Anesthesia Type: General Level of consciousness: awake and alert Pain management: pain level controlled Vital Signs Assessment: post-procedure vital signs reviewed and stable Respiratory status: spontaneous breathing, nonlabored ventilation, respiratory function stable and patient connected to nasal cannula oxygen Cardiovascular status: blood pressure returned to baseline and stable Postop Assessment: no apparent nausea or vomiting Anesthetic complications: no   No notable events documented.  Last Vitals:  Vitals:   08/13/21 1415 08/13/21 1435  BP:  (!) 157/63  Pulse: 79 83  Resp: 12 12  Temp:    SpO2: 96% 95%    Last Pain:  Vitals:   08/13/21 1435  TempSrc: Oral  PainSc:                  Catalina Gravel

## 2021-08-13 NOTE — Progress Notes (Addendum)
  Day of Surgery Note    Subjective:  Called to bedside by attending PACU RN regarding incisional swelling, bruising and hoarseness.   Vitals:   08/13/21 1230 08/13/21 1245  BP: (!) 142/60 (!) 141/54  Pulse: 79 79  Resp: 12 17  Temp:    SpO2: 96% 98%    Incisions:   Well-approximated. + ecchymosis. Soft tissue fullness extending to midline and into lateral neck. Extremities:  5/5 UE grip strength. Moving LE well Cardiac:  RRR Lungs:  + cough; SaO2 normal on RA  Assessment/Plan:  This is a 60 y.o. male who is s/p right TCAR. Discussed with Dr. Scot Dock who had examined the patient. Discussed with Dr. Donzetta Matters. Patient was hoarse upon waking from anesthesia. Continue to monitor closely. Will keep NPO for now.  Risa Grill, PA-C 08/13/2021 1:00 PM [phone]

## 2021-08-13 NOTE — Progress Notes (Signed)
Pacu RN Report to floor given  Gave report to [employer]. 2K81. Discussed surgery, meds given in OR and Pacu, VS, IV fluids given, EBL, urine output, pain and other pertinent information. Also discussed if pt had any family or friends here or belongings with them.   Discussed ongoing R neck hematoma and that is has progressively gotten bigger. It has remained the same size this last hour and half. It does not seem to be getting bigger. Very sensitive to touch. Dr Donzetta Matters, Katharine Look PA, Dr Doren Custard are all aware and have been following him. Dr Donzetta Matters will come up after his surgery in OR is done to reassess. This should be after 1530.   Pt's wife has been updated throughout this time.   Pt exits my care.

## 2021-08-13 NOTE — Progress Notes (Signed)
Pt transferred to 4E14 from PACU.  R neck hematoma site assessed and consistent with report received.  Parameters marked by PACU RN and reportedly no change in the past 1.5hrs.  Pt with hoarse voice, but no difficulty swallowing or breathing.  Neuro intact.  Pt oriented to room and call bell, family at bedside. CCMD notified.

## 2021-08-13 NOTE — Anesthesia Procedure Notes (Signed)
Procedure Name: Intubation Date/Time: 08/13/2021 7:45 AM Performed by: Minerva Ends, CRNA Pre-anesthesia Checklist: Patient identified, Emergency Drugs available, Suction available and Patient being monitored Patient Re-evaluated:Patient Re-evaluated prior to induction Oxygen Delivery Method: Circle system utilized Preoxygenation: Pre-oxygenation with 100% oxygen Induction Type: IV induction Ventilation: Mask ventilation without difficulty Laryngoscope Size: Mac and 3 Grade View: Grade I Tube type: Oral Number of attempts: 1 Airway Equipment and Method: Stylet Placement Confirmation: ETT inserted through vocal cords under direct vision, positive ETCO2 and breath sounds checked- equal and bilateral Secured at: 23 cm Tube secured with: Tape Dental Injury: Teeth and Oropharynx as per pre-operative assessment

## 2021-08-14 DIAGNOSIS — Z95828 Presence of other vascular implants and grafts: Secondary | ICD-10-CM

## 2021-08-14 LAB — CBC
HCT: 25.6 % — ABNORMAL LOW (ref 39.0–52.0)
Hemoglobin: 8.6 g/dL — ABNORMAL LOW (ref 13.0–17.0)
MCH: 28 pg (ref 26.0–34.0)
MCHC: 33.6 g/dL (ref 30.0–36.0)
MCV: 83.4 fL (ref 80.0–100.0)
Platelets: 272 10*3/uL (ref 150–400)
RBC: 3.07 MIL/uL — ABNORMAL LOW (ref 4.22–5.81)
RDW: 14 % (ref 11.5–15.5)
WBC: 13.7 10*3/uL — ABNORMAL HIGH (ref 4.0–10.5)
nRBC: 0 % (ref 0.0–0.2)

## 2021-08-14 LAB — BASIC METABOLIC PANEL
Anion gap: 6 (ref 5–15)
BUN: 21 mg/dL (ref 8–23)
CO2: 22 mmol/L (ref 22–32)
Calcium: 8.2 mg/dL — ABNORMAL LOW (ref 8.9–10.3)
Chloride: 101 mmol/L (ref 98–111)
Creatinine, Ser: 2.29 mg/dL — ABNORMAL HIGH (ref 0.61–1.24)
GFR, Estimated: 31 mL/min — ABNORMAL LOW (ref >60)
Glucose, Bld: 174 mg/dL — ABNORMAL HIGH (ref 70–99)
Potassium: 4.6 mmol/L (ref 3.5–5.1)
Sodium: 129 mmol/L — ABNORMAL LOW (ref 135–145)

## 2021-08-14 LAB — LIPID PANEL
Cholesterol: 92 mg/dL (ref 0–200)
HDL: 33 mg/dL — ABNORMAL LOW (ref >40)
LDL Cholesterol: 41 mg/dL (ref 0–99)
Total CHOL/HDL Ratio: 2.8 RATIO
Triglycerides: 88 mg/dL (ref <150)
VLDL: 18 mg/dL (ref 0–40)

## 2021-08-14 LAB — GLUCOSE, CAPILLARY: Glucose-Capillary: 153 mg/dL — ABNORMAL HIGH (ref 70–99)

## 2021-08-14 MED ORDER — ROSUVASTATIN CALCIUM 20 MG PO TABS: 40.0000 mg | ORAL_TABLET | Freq: Every day | ORAL | Status: AC

## 2021-08-14 MED ORDER — OXYCODONE-ACETAMINOPHEN 5-325 MG PO TABS: 1.0000 | ORAL_TABLET | Freq: Four times a day (QID) | ORAL | 0 refills | Status: DC | PRN

## 2021-08-14 MED ORDER — METFORMIN HCL 1000 MG PO TABS: 500.0000 mg | ORAL_TABLET | Freq: Two times a day (BID) | ORAL | Status: DC

## 2021-08-14 MED ORDER — ROSUVASTATIN CALCIUM 20 MG PO TABS: 40.0000 mg | ORAL_TABLET | Freq: Every day | ORAL | Status: DC

## 2021-08-14 NOTE — Discharge Summary (Signed)
Discharge Summary     Raymond [name] [birth year] 60 y.o. male  [id number]  Admission Date: 08/13/2021  Discharge Date: 08/14/2021  Physician: Thomes Lolling*  Admission Diagnosis: Asymptomatic carotid artery stenosis without infarction, right [I65.21]   HPI:   This is a 60 y.o. male with asymptomatic right ICA stenosis.  He has been worked up for endarterectomy versus medical therapy versus stenting we have elected for stenting.  He is cleared cardiology clearance and he is indicated for transcarotid stenting.   Hospital Course:  The patient was admitted to the hospital and taken to the operating room on 08/13/2021 and underwent right TCAR.  Findings: The ICA was visualized its deep LAO as its best projection.  There was greater than 80% stenosis.  At completion there is less than 20% residual stenosis.  Patient was neurologically intact upon awakening from anesthesia.  The pt tolerated the procedure well and was transported to the PACU in good condition.  In recovery there was some concern about hematoma around the neck incision.  This remained stable.     By POD 1, the pt neuro status in tact.  He was able to void and eat without difficulty.  His creatinine was mildly elevated from pre op. He is instructed to decrease the dose of his Metformin and follow up with his PCP next week to have his labs checked (renal function and DM) and f/u with Dr. Pleas Koch.  Discussed with pharmacy and will decrease to 500mg  bid starting on 08/16/2021 as his GFR is 31.     Recent Labs    08/14/21 0136  NA 129*  K 4.6  CL 101  CO2 22  GLUCOSE 174*  BUN 21  CALCIUM 8.2*   Recent Labs    08/14/21 0136  WBC 13.7*  HGB 8.6*  HCT 25.6*  PLT 272   No results for input(s): INR in the last 72 hours.   Discharge Instructions     Discharge patient   Complete by: As directed    Discharge disposition: 01-Home or Self Care   Discharge patient date: 08/14/2021       Discharge  Diagnosis:  Asymptomatic carotid artery stenosis without infarction, right [I65.21]  Secondary Diagnosis: Patient Active Problem List   Diagnosis Date Noted   Asymptomatic carotid artery stenosis without infarction, right 08/13/2021   Hypertension 08/10/2021   Hyperlipidemia 08/10/2021   Atherosclerosis of native arteries of the extremities with intermittent claudication 04/23/2012   Past Medical History:  Diagnosis Date   Cancer ([place])    melanoma   Carotid artery occlusion    Cervical spondylosis with myelopathy    Arthritis   CKD (chronic kidney disease)    stage III (07/2021)   COPD (chronic obstructive pulmonary disease) ([place]) 2022   Diabetes mellitus    Type II   Hyperlipidemia    Hypertension    Impotence of organic origin    Insomnia    Peripheral arterial disease ([place])    Restless leg syndrome    Sleep apnea     Allergies as of 08/14/2021   No Known Allergies      Medication List     TAKE these medications    acetaminophen 500 MG tablet Commonly known as: TYLENOL Take 1,000 mg by mouth every 6 (six) hours as needed for mild pain.   amLODipine 10 MG tablet Commonly known as: NORVASC Take 10 mg by mouth daily.   aspirin 81 MG tablet Take 81 mg by mouth daily.  cilostazol 100 MG tablet Commonly known as: PLETAL Take 1 tablet (100 mg total) by mouth 2 (two) times daily before a meal.   clopidogrel 75 MG tablet Commonly known as: PLAVIX Take 1 tablet (75 mg total) by mouth daily.   diphenhydrAMINE 25 MG tablet Commonly known as: BENADRYL Take 25 mg by mouth 2 (two) times daily as needed for allergies.   doxazosin 4 MG tablet Commonly known as: Cardura Take 1 tablet (4 mg total) by mouth daily.   famotidine 20 MG tablet Commonly known as: PEPCID Take 20 mg by mouth daily.   fluticasone 50 MCG/ACT nasal spray Commonly known as: FLONASE Place 2 sprays into both nostrils daily.   lisinopril 40 MG tablet Commonly known as: ZESTRIL Take 40  mg by mouth daily.   metFORMIN 1000 MG tablet Commonly known as: GLUCOPHAGE Take 0.5 tablets (500 mg total) by mouth 2 (two) times daily with a meal. What changed: how much to take   metoprolol succinate 50 MG 24 hr tablet Commonly known as: TOPROL-XL Take 50 mg by mouth daily.   multivitamin with minerals Tabs tablet Take 1 tablet by mouth daily.   oxyCODONE-acetaminophen 5-325 MG tablet Commonly known as: Percocet Take 1 tablet by mouth every 6 (six) hours as needed for severe pain.   polyvinyl alcohol 1.4 % ophthalmic solution Commonly known as: LIQUIFILM TEARS 1 drop as needed for dry eyes.   rosuvastatin 20 MG tablet Commonly known as: CRESTOR Take 2 tablets (40 mg total) by mouth daily. What changed: how much to take   Spiriva HandiHaler 18 MCG inhalation capsule Generic drug: tiotropium Place 18 mcg into inhaler and inhale daily.   traZODone 100 MG tablet Commonly known as: DESYREL Take 100 mg by mouth at bedtime.         [facility] Discharge Instructions Carotid Endarterectomy (CEA)  Please refer to the following instructions for your post-procedure care. Your surgeon or physician assistant will discuss any changes with you.  Activity  You are encouraged to walk as much as you can. You can slowly return to normal activities but must avoid strenuous activity and heavy lifting until your doctor tell you it's OK. Avoid activities such as vacuuming or swinging a golf club. You can drive after one week if you are comfortable and you are no longer taking prescription pain medications. It is normal to feel tired for serval weeks after your surgery. It is also normal to have difficulty with sleep habits, eating, and bowel movements after surgery. These will go away with time.  Bathing/Showering  You may shower after you come home. Do not soak in a bathtub, hot tub, or swim until the incision heals completely.  Incision  Care  Shower every day. Clean your incision with mild soap and water. Pat the area dry with a clean towel. You do not need a bandage unless otherwise instructed. Do not apply any ointments or creams to your incision. You may have skin glue on your incision. Do not peel it off. It will come off on its own in about one week. Your incision may feel thickened and raised for several weeks after your surgery. This is normal and the skin will soften over time. For Men Only: It's OK to shave around the incision but do not shave the incision itself for 2 weeks. It is common to have numbness under your chin that could last for several months.  Diet  Resume your normal diet. There  are no special food restrictions following this procedure. A low fat/low cholesterol diet is recommended for all patients with vascular disease. In order to heal from your surgery, it is CRITICAL to get adequate nutrition. Your body requires vitamins, minerals, and protein. Vegetables are the best source of vitamins and minerals. Vegetables also provide the perfect balance of protein. Processed food has little nutritional value, so try to avoid this.  Medications  Resume taking all of your medications unless your doctor or physician assistant tells you not to.  If your incision is causing pain, you may take over-the- counter pain relievers such as acetaminophen (Tylenol). If you were prescribed a stronger pain medication, please be aware these medications can cause nausea and constipation.  Prevent nausea by taking the medication with a snack or meal. Avoid constipation by drinking plenty of fluids and eating foods with a high amount of fiber, such as fruits, vegetables, and grains.  Do not take Tylenol if you are taking prescription pain medications.  Okay to take Metformin at half dose (500mg  twice daily) starting on Monday 08/16/2021.  Your dosage of Crestor was increased to 40mg  daily.  Follow Up  Our office will schedule a  follow up appointment 2-3 weeks following discharge.  Please call us immediately for any of the following conditions  Increased pain, redness, drainage (pus) from your incision site. Fever of 101 degrees or higher. If you should develop stroke (slurred speech, difficulty swallowing, weakness on one side of your body, loss of vision) you should call 911 and go to the nearest emergency room.  Reduce your risk of vascular disease:  Stop smoking. If you would like help call [facility] at 1-800-QUIT-NOW 786-441-0605) or [place] at [phone]. Manage your cholesterol Maintain a desired weight Control your diabetes Keep your blood pressure down  If you have any questions, please call the office at [phone].  Prescriptions given: 1.   Roxicet #8 No Refill  Disposition: home  Patient's condition: is Good  Follow up: 1. [facility] in 3 weeks with carotid duplex 2. Dr. Pleas Koch for BMP and diabetes check as metformin dosage was decreased due to renal function with GFR of 31. (Discussed with pharmacy)   Leontine Locket, PA-C [facility] [phone]   --- For [facility] use ---   Modified Rankin score at D/C (0-6): 0  IV medication needed for:  1. Hypertension: No 2. Hypotension: No  Post-op Complications: No  1. Post-op CVA or TIA: No  If yes: Event classification (right eye, left eye, right cortical, left cortical, verterobasilar, other): n/a  If yes: Timing of event (intra-op, <6 hrs post-op, >=6 hrs post-op, unknown): n/a  2. CN injury: No  If yes: CN n/a injuried   3. Myocardial infarction: No  If yes: Dx by (EKG or clinical, Troponin): n/a  4.  CHF: No  5.  Dysrhythmia (new): No  6. Wound infection: No  7. Reperfusion symptoms: No  8. Return to OR: No  If yes: return to OR for (bleeding, neurologic, other CEA incision, other): n/a  Discharge medications: Statin use:  Yes ASA use:  Yes   Beta blocker use:  Yes ACE-Inhibitor  use:  Yes  ARB use:  No CCB use: Yes P2Y12 Antagonist use: Yes, [ x] Plavix, [ ]  Plasugrel, [ ]  Ticlopinine, [ ]  Ticagrelor, [ ]  Other, [ ]  No for medical reason, [ ]  Non-compliant, [ ]  Not-indicated Anti-coagulant use:  No, [ ]  Warfarin, [ ]  Rivaroxaban, [ ]  Dabigatran,

## 2021-08-14 NOTE — Progress Notes (Addendum)
  Progress Note    08/14/2021 7:42 AM 1 Day Post-Op  Subjective:  says as long as his neck isn't touched, he doesn't have any pain.  Felt good to get out of bed.  Swallowing without difficulty.  Able to void.    Afebrile HR 60's-70's  830'N-407'W systolic 80% RA  Gtts:  none   Vitals:   08/14/21 0348 08/14/21 0723  BP: (!) 141/58 (!) 159/67  Pulse: 60 79  Resp: 17 20  Temp: 98.4 F (36.9 C) 97.9 F (36.6 C)  SpO2: 94% 96%     Physical Exam: Neuro:  in tact Lungs:  non labored Incision:  mild fullness right nect  CBC    Component Value Date/Time   WBC 13.7 (H) 08/14/2021 0136   RBC 3.07 (L) 08/14/2021 0136   HGB 8.6 (L) 08/14/2021 0136   HCT 25.6 (L) 08/14/2021 0136   PLT 272 08/14/2021 0136   MCV 83.4 08/14/2021 0136   MCH 28.0 08/14/2021 0136   MCHC 33.6 08/14/2021 0136   RDW 14.0 08/14/2021 0136    BMET    Component Value Date/Time   NA 129 (L) 08/14/2021 0136   K 4.6 08/14/2021 0136   CL 101 08/14/2021 0136   CO2 22 08/14/2021 0136   GLUCOSE 174 (H) 08/14/2021 0136   BUN 21 08/14/2021 0136   CREATININE 2.29 (H) 08/14/2021 0136   CALCIUM 8.2 (L) 08/14/2021 0136   GFRNONAA 31 (L) 08/14/2021 0136     Intake/Output Summary (Last 24 hours) at 08/14/2021 0742 Last data filed at 08/13/2021 0913 Gross per 24 hour  Intake 600 ml  Output 50 ml  Net 550 ml     Assessment/Plan:  This is a 60 y.o. male who is s/p right TCAR 1 Day Post-Op  -pt is doing well this am. -slight increase in his creatinine to 2.29 from 1.9.  his renal function is followed by his PCP Dr. Pleas Koch.  Discussed Metformin with pharmacy and will decrease dose down to 500mg  bid given GFR is 31 and he is to restart this on Monday since he received IV contrast yesterday.  He will f/u with Dr. Pleas Koch this week to check his renal function.   -pt neuro exam is in tact -pt has ambulated to the restroom -pt has voided -f/u with [facility] in 4 weeks carotid duplex -continue plavix/asa/statin.   Per pharmacy, his crestor dosage was increased to 40mg  daily.    Leontine Locket, PA-C [facility] [phone]  I have interviewed the patient and examined the patient. I agree with the findings by the PA.  I think that the swelling in the right neck has improved.  Currently there is really minimal swelling and this is soft.  His hoarseness has also improved.  Agree with plans for discharge today.  Gae Gallop, MD

## 2021-08-14 NOTE — Progress Notes (Signed)
Patient given discharge instructions. Wife present. PIV removed. Telemetry box removed, CCMD notified. Patient taken to vehicle by wheelchair by staff. ° °Jafeth Mustin L Latavious Bitter, RN  °

## 2021-08-14 NOTE — Progress Notes (Signed)
PHARMACIST LIPID MONITORING   Raymond [name] is a 60 y.o. male admitted on 08/13/2021 with R ICA stenosis.  Pharmacy has been consulted to optimize lipid-lowering therapy with the indication of secondary prevention for clinical ASCVD.  Recent Labs:  Lipid Panel (last 6 months):   Lab Results  Component Value Date   CHOL 92 08/14/2021   TRIG 88 08/14/2021   HDL 33 (L) 08/14/2021   CHOLHDL 2.8 08/14/2021   VLDL 18 08/14/2021   LDLCALC 41 08/14/2021    Hepatic function panel (last 6 months):   Lab Results  Component Value Date   AST 11 (L) 08/09/2021   ALT 14 08/09/2021   ALKPHOS 80 08/09/2021   BILITOT 0.3 08/09/2021    SCr (since admission):   Serum creatinine: 2.29 mg/dL (H) 08/14/21 0136 Estimated creatinine clearance: 36.7 mL/min (A)  Current therapy and lipid therapy tolerance Current lipid-lowering therapy: rosuvastatin 20mg  daily  Previous lipid-lowering therapies (if applicable): rosuvastatin 20mg  daily  Documented or reported allergies or intolerances to lipid-lowering therapies (if applicable): n/a  Assessment:   Patient agrees with changes to lipid-lowering therapy  Plan:    1.Statin intensity (high intensity recommended for all patients regardless of the LDL):  Add or increase statin to high intensity. Change rosuvastatin to 40mg  daily   2.Add ezetimibe (if any one of the following):   Not indicated at this time.  3.Refer to lipid clinic:   No  4.Follow-up with:  Primary care provider - Burdine, Virgina Evener, MD  5.Follow-up labs after discharge:  Changes in lipid therapy were made. Check a lipid panel in 8-12 weeks then annually.      Cristela Felt, PharmD, BCPS Clinical Pharmacist 08/14/2021 9:29 AM

## 2021-08-16 ENCOUNTER — Encounter (HOSPITAL_COMMUNITY): Payer: Self-pay | Admitting: Vascular Surgery

## 2021-08-16 LAB — POCT ACTIVATED CLOTTING TIME
Activated Clotting Time: 263 seconds
Activated Clotting Time: 137 seconds

## 2021-08-18 ENCOUNTER — Telehealth: Payer: Self-pay

## 2021-08-18 NOTE — Telephone Encounter (Signed)
Pt called with mild swelling of bilateral foot/ankle. He is going to wear his compression stocking and has been advised to elevate his legs periodically and if he does not see any improvement, to call us back. Pt verbalized understanding; no further questions/concerns.

## 2021-08-21 ENCOUNTER — Other Ambulatory Visit: Payer: Self-pay | Admitting: Surgery

## 2021-08-21 MED ORDER — FUROSEMIDE 20 MG PO TABS: 20.0000 mg | ORAL_TABLET | Freq: Every day | ORAL | 0 refills | Status: DC

## 2021-08-21 NOTE — Progress Notes (Signed)
Patient called complaining of ankle  swelling.  Requesting meds.  I gave him 3 doses of lasix 20 mg.  He will call Monday if he is not improved  [name]

## 2021-08-23 ENCOUNTER — Other Ambulatory Visit: Payer: Self-pay

## 2021-08-23 ENCOUNTER — Encounter: Payer: Self-pay | Admitting: Vascular Surgery

## 2021-08-23 ENCOUNTER — Telehealth: Payer: Self-pay

## 2021-08-23 ENCOUNTER — Ambulatory Visit (INDEPENDENT_AMBULATORY_CARE_PROVIDER_SITE_OTHER): Payer: Medicare Other | Admitting: Vascular Surgery

## 2021-08-23 VITALS — BP 145/61 | HR 78 | Temp 98.4°F | Resp 18 | Ht 72.0 in | Wt 168.0 lb

## 2021-08-23 DIAGNOSIS — R600 Localized edema: Secondary | ICD-10-CM

## 2021-08-23 DIAGNOSIS — N179 Acute kidney failure, unspecified: Secondary | ICD-10-CM | POA: Diagnosis not present

## 2021-08-23 DIAGNOSIS — E871 Hypo-osmolality and hyponatremia: Secondary | ICD-10-CM | POA: Diagnosis not present

## 2021-08-23 DIAGNOSIS — Z6822 Body mass index (BMI) 22.0-22.9, adult: Secondary | ICD-10-CM | POA: Diagnosis not present

## 2021-08-23 DIAGNOSIS — I97638 Postprocedural hematoma of a circulatory system organ or structure following other circulatory system procedure: Secondary | ICD-10-CM | POA: Diagnosis not present

## 2021-08-23 DIAGNOSIS — I6529 Occlusion and stenosis of unspecified carotid artery: Secondary | ICD-10-CM | POA: Diagnosis not present

## 2021-08-23 DIAGNOSIS — Z95828 Presence of other vascular implants and grafts: Secondary | ICD-10-CM | POA: Diagnosis not present

## 2021-08-23 DIAGNOSIS — N183 Chronic kidney disease, stage 3 unspecified: Secondary | ICD-10-CM | POA: Diagnosis not present

## 2021-08-23 DIAGNOSIS — I10 Essential (primary) hypertension: Secondary | ICD-10-CM | POA: Diagnosis not present

## 2021-08-23 DIAGNOSIS — E1151 Type 2 diabetes mellitus with diabetic peripheral angiopathy without gangrene: Secondary | ICD-10-CM | POA: Diagnosis not present

## 2021-08-23 NOTE — Progress Notes (Signed)
Office Note    HPI: Raymond [name] is a 60 y.o. (07/11/1961) male status post right TCAR on 08/13/2021. He presents today for call the office with concern for bilateral lower extremity edema.  Over the weekend, he was prescribed Lasix.  Earlier today, the patient saw his PCP Dr. Pleas Koch, who ordered labs.  On exam today, Raymond [name] was doing well.  He was accompanied by his wife.  His major concern was bilateral lower extremity edema.  He said it had improved from over the weekend but he still noted pain with ambulation due to the swelling.  Denies history of labored breathing, shortness of breath.   The pt is  on a statin for cholesterol management.  The pt is  on a daily aspirin.   Other AC:  plavix The pt is  on medication for hypertension.   The pt is diabetic.  Tobacco hx:  current everyday smoker  Past Medical History:  Diagnosis Date   Cancer ([place])    melanoma   Carotid artery occlusion    Cervical spondylosis with myelopathy    Arthritis   CKD (chronic kidney disease)    stage III (07/2021)   COPD (chronic obstructive pulmonary disease) ([place]) 2022   Diabetes mellitus    Type II   Hyperlipidemia    Hypertension    Impotence of organic origin    Insomnia    Peripheral arterial disease ([place])    Restless leg syndrome    Sleep apnea     Past Surgical History:  Procedure Laterality Date   APPENDECTOMY     years ago   EYE SURGERY  2017   cataracts   spinal fusion  09/12/1998   SPINE SURGERY  09/12/1998   Cervical Myelopathy- S/P fusion   TRANSCAROTID ARTERY REVASCULARIZATION  Right 08/13/2021   Procedure: RIGHT TRANSCAROTID ARTERY REVASCULARIZATION;  Surgeon: Waynetta Sandy, MD;  Location: [facility];  Service: Vascular;  Laterality: Right;   ULTRASOUND GUIDANCE FOR VASCULAR ACCESS Left 08/13/2021   Procedure: ULTRASOUND GUIDANCE FOR VASCULAR ACCESS;  Surgeon: Waynetta Sandy, MD;  Location: [facility] OR;  Service: Vascular;  Laterality: Left;    Social History    Socioeconomic History   Marital status: Married    Spouse name: Not on file   Number of children: Not on file   Years of education: Not on file   Highest education level: Not on file  Occupational History   Not on file  Tobacco Use   Smoking status: Every Day    Packs/day: 1.00    Types: Cigarettes   Smokeless tobacco: Never   Tobacco comments:    smokes about 3 cigs in last month  Vaping Use   Vaping Use: Never used  Substance and Sexual Activity   Alcohol use: Not Currently   Drug use: No   Sexual activity: Not on file  Other Topics Concern   Not on file  Social History Narrative   Not on file   Social Determinants of Health   Financial Resource Strain: Not on file  Food Insecurity: Not on file  Transportation Needs: Not on file  Physical Activity: Not on file  Stress: Not on file  Social Connections: Not on file  Intimate Partner Violence: Not on file    Family History  Problem Relation Age of Onset   Cancer Mother    Hypertension Father    Hypertension Sister     Current Outpatient Medications  Medication Sig Dispense Refill   acetaminophen (TYLENOL)  500 MG tablet Take 1,000 mg by mouth every 6 (six) hours as needed for mild pain.     amLODipine (NORVASC) 10 MG tablet Take 10 mg by mouth daily.     aspirin 81 MG tablet Take 81 mg by mouth daily.     cilostazol (PLETAL) 100 MG tablet Take 1 tablet (100 mg total) by mouth 2 (two) times daily before a meal. 180 tablet 3   clopidogrel (PLAVIX) 75 MG tablet Take 1 tablet (75 mg total) by mouth daily. 30 tablet 6   diphenhydrAMINE (BENADRYL) 25 MG tablet Take 25 mg by mouth 2 (two) times daily as needed for allergies.     doxazosin (CARDURA) 4 MG tablet Take 1 tablet (4 mg total) by mouth daily. 30 tablet 3   famotidine (PEPCID) 20 MG tablet Take 20 mg by mouth daily.     fluticasone (FLONASE) 50 MCG/ACT nasal spray Place 2 sprays into both nostrils daily.     furosemide (LASIX) 20 MG tablet Take 1 tablet (20  mg total) by mouth daily for 3 days. 3 tablet 0   lisinopril (PRINIVIL,ZESTRIL) 40 MG tablet Take 40 mg by mouth daily.     metFORMIN (GLUCOPHAGE) 1000 MG tablet Take 0.5 tablets (500 mg total) by mouth 2 (two) times daily with a meal.     metoprolol succinate (TOPROL-XL) 50 MG 24 hr tablet Take 50 mg by mouth daily.     Multiple Vitamin (MULTIVITAMIN WITH MINERALS) TABS tablet Take 1 tablet by mouth daily.     polyvinyl alcohol (LIQUIFILM TEARS) 1.4 % ophthalmic solution 1 drop as needed for dry eyes.     rosuvastatin (CRESTOR) 20 MG tablet Take 2 tablets (40 mg total) by mouth daily.     SPIRIVA HANDIHALER 18 MCG inhalation capsule Place 18 mcg into inhaler and inhale daily.     traZODone (DESYREL) 100 MG tablet Take 100 mg by mouth at bedtime.     oxyCODONE-acetaminophen (PERCOCET) 5-325 MG tablet Take 1 tablet by mouth every 6 (six) hours as needed for severe pain. (Patient not taking: Reported on 08/23/2021) 8 tablet 0   No current facility-administered medications for this visit.    No Known Allergies   REVIEW OF SYSTEMS:   [X]  denotes positive finding, [ ]  denotes negative finding Cardiac  Comments:  Chest pain or chest pressure:    Shortness of breath upon exertion:    Short of breath when lying flat:    Irregular heart rhythm:        Vascular    Pain in calf, thigh, or hip brought on by ambulation:    Pain in feet at night that wakes you up from your sleep:     Blood clot in your veins:    Leg swelling:         Pulmonary    Oxygen at home:    Productive cough:     Wheezing:         Neurologic    Sudden weakness in arms or legs:     Sudden numbness in arms or legs:     Sudden onset of difficulty speaking or slurred speech:    Temporary loss of vision in one eye:     Problems with dizziness:         Gastrointestinal    Blood in stool:     Vomited blood:         Genitourinary    Burning when urinating:     Blood in  urine:        Psychiatric    Major  depression:         Hematologic    Bleeding problems:    Problems with blood clotting too easily:        Skin    Rashes or ulcers:        Constitutional    Fever or chills:      PHYSICAL EXAMINATION:  Vitals:   08/23/21 1504  BP: (!) 145/61  Pulse: 78  Resp: 18  Temp: 98.4 F (36.9 C)  TempSrc: Temporal  SpO2: 100%  Weight: 168 lb (76.2 kg)  Height: 6' (1.829 m)    General:  WDWN in NAD; vital signs documented above Gait: Not observed HENT: WNL, normocephalic Pulmonary: normal non-labored breathing , without wheezing Cardiac: regular HR Abdomen: soft, NT, no masses Skin: without rashes Vascular Exam/Pulses:  Right Left  Radial 2+ (normal) 2+ (normal)  Ulnar 2+ (normal) 2+ (normal)                   Extremities: without ischemic changes, without Gangrene , without cellulitis; without open wounds;  Bilateral lower extremity 2+ pitting edema of the ankle and feet. Musculoskeletal: no muscle wasting or atrophy  Neurologic: A&O X 3;  No focal weakness or paresthesias are detected Psychiatric:  The pt has Normal affect.   Non-Invasive Vascular Imaging:   none    ASSESSMENT/PLAN: ROCKFORD LEINEN is a 60 y.o. male presenting with bilateral lower extremity edema of the ankle and feet.  He has no signs or symptoms concerning for deep venous thrombosis.  Denies shortness of breath, increased work of breathing, wheezing, chest pain. The lower extremity edema has improved from over the weekend.  He has been relatively nonambulatory since surgery and his diet has also been poor.  I appreciate Dr. Pleas Koch seeing him earlier today and ordering labs, pending normal renal function, I think the patient should improve with more diuresis.  I told Raymond [name], should the chest pain or shortness of breath occur, he needs to seek immediate medical attention.  In the office today, he had no symptoms concerning for MI, DVT, PE.  Patient would benefit from compression stockings, improved  ambulation,  Raymond John, MD [facility] [phone]

## 2021-08-23 NOTE — Telephone Encounter (Signed)
Wife calls today for patient to report that his bilateral ankle swelling has gotten worse. He is s/p TCAR on 12/2. They spoke to Dr. Trula Slade over the weekend who called in 3 days of lasix to see if swelling would improve. Wife is extremely concerned and says patient can barely walk. They are seeing his primary care in [place] today - but would like to see a vascular surgeon today. Informed MD and placed on schedule.

## 2021-08-24 DIAGNOSIS — I6503 Occlusion and stenosis of bilateral vertebral arteries: Secondary | ICD-10-CM | POA: Diagnosis not present

## 2021-08-24 DIAGNOSIS — K922 Gastrointestinal hemorrhage, unspecified: Secondary | ICD-10-CM | POA: Diagnosis not present

## 2021-08-24 DIAGNOSIS — I6603 Occlusion and stenosis of bilateral middle cerebral arteries: Secondary | ICD-10-CM | POA: Diagnosis not present

## 2021-08-24 DIAGNOSIS — I10 Essential (primary) hypertension: Secondary | ICD-10-CM | POA: Diagnosis not present

## 2021-08-24 DIAGNOSIS — I6623 Occlusion and stenosis of bilateral posterior cerebral arteries: Secondary | ICD-10-CM | POA: Diagnosis not present

## 2021-08-24 DIAGNOSIS — D649 Anemia, unspecified: Secondary | ICD-10-CM | POA: Diagnosis not present

## 2021-08-24 DIAGNOSIS — I639 Cerebral infarction, unspecified: Secondary | ICD-10-CM | POA: Diagnosis not present

## 2021-08-24 DIAGNOSIS — R9431 Abnormal electrocardiogram [ECG] [EKG]: Secondary | ICD-10-CM | POA: Diagnosis not present

## 2021-08-24 DIAGNOSIS — E119 Type 2 diabetes mellitus without complications: Secondary | ICD-10-CM | POA: Diagnosis not present

## 2021-08-24 DIAGNOSIS — I498 Other specified cardiac arrhythmias: Secondary | ICD-10-CM | POA: Diagnosis not present

## 2021-08-24 DIAGNOSIS — Z7902 Long term (current) use of antithrombotics/antiplatelets: Secondary | ICD-10-CM | POA: Diagnosis not present

## 2021-08-24 DIAGNOSIS — I672 Cerebral atherosclerosis: Secondary | ICD-10-CM | POA: Diagnosis not present

## 2021-08-24 DIAGNOSIS — R0600 Dyspnea, unspecified: Secondary | ICD-10-CM | POA: Diagnosis not present

## 2021-08-25 ENCOUNTER — Other Ambulatory Visit: Payer: Self-pay

## 2021-08-25 DIAGNOSIS — I6523 Occlusion and stenosis of bilateral carotid arteries: Secondary | ICD-10-CM

## 2021-08-26 ENCOUNTER — Encounter (HOSPITAL_COMMUNITY): Payer: Self-pay

## 2021-08-26 ENCOUNTER — Other Ambulatory Visit: Payer: Self-pay

## 2021-08-26 ENCOUNTER — Emergency Department (HOSPITAL_COMMUNITY)
Admission: EM | Admit: 2021-08-26 | Discharge: 2021-08-26 | Disposition: A | Discharge status: Home / Self Care | Payer: Medicare Other | Attending: Emergency Medicine | Admitting: Emergency Medicine

## 2021-08-26 DIAGNOSIS — Z7984 Long term (current) use of oral hypoglycemic drugs: Secondary | ICD-10-CM | POA: Diagnosis not present

## 2021-08-26 DIAGNOSIS — E1122 Type 2 diabetes mellitus with diabetic chronic kidney disease: Secondary | ICD-10-CM | POA: Insufficient documentation

## 2021-08-26 DIAGNOSIS — N183 Chronic kidney disease, stage 3 unspecified: Secondary | ICD-10-CM | POA: Insufficient documentation

## 2021-08-26 DIAGNOSIS — D649 Anemia, unspecified: Secondary | ICD-10-CM | POA: Diagnosis not present

## 2021-08-26 DIAGNOSIS — J449 Chronic obstructive pulmonary disease, unspecified: Secondary | ICD-10-CM | POA: Insufficient documentation

## 2021-08-26 DIAGNOSIS — Z7982 Long term (current) use of aspirin: Secondary | ICD-10-CM | POA: Diagnosis not present

## 2021-08-26 DIAGNOSIS — Z85820 Personal history of malignant melanoma of skin: Secondary | ICD-10-CM | POA: Insufficient documentation

## 2021-08-26 DIAGNOSIS — F17210 Nicotine dependence, cigarettes, uncomplicated: Secondary | ICD-10-CM | POA: Insufficient documentation

## 2021-08-26 DIAGNOSIS — I129 Hypertensive chronic kidney disease with stage 1 through stage 4 chronic kidney disease, or unspecified chronic kidney disease: Secondary | ICD-10-CM | POA: Insufficient documentation

## 2021-08-26 DIAGNOSIS — Z79899 Other long term (current) drug therapy: Secondary | ICD-10-CM | POA: Insufficient documentation

## 2021-08-26 DIAGNOSIS — Z13818 Encounter for screening for other digestive system disorders: Secondary | ICD-10-CM | POA: Diagnosis present

## 2021-08-26 LAB — COMPREHENSIVE METABOLIC PANEL
ALT: 15 U/L (ref 0–44)
AST: 13 U/L — ABNORMAL LOW (ref 15–41)
Albumin: 2.9 g/dL — ABNORMAL LOW (ref 3.5–5.0)
Alkaline Phosphatase: 70 U/L (ref 38–126)
Anion gap: 9 (ref 5–15)
BUN: 21 mg/dL (ref 8–23)
CO2: 21 mmol/L — ABNORMAL LOW (ref 22–32)
Calcium: 8.8 mg/dL — ABNORMAL LOW (ref 8.9–10.3)
Chloride: 105 mmol/L (ref 98–111)
Creatinine, Ser: 1.88 mg/dL — ABNORMAL HIGH (ref 0.61–1.24)
GFR, Estimated: 40 mL/min — ABNORMAL LOW (ref >60)
Glucose, Bld: 196 mg/dL — ABNORMAL HIGH (ref 70–99)
Potassium: 3.5 mmol/L (ref 3.5–5.1)
Sodium: 135 mmol/L (ref 135–145)
Total Bilirubin: 0.5 mg/dL (ref 0.3–1.2)
Total Protein: 5.4 g/dL — ABNORMAL LOW (ref 6.5–8.1)

## 2021-08-26 LAB — PROTIME-INR
INR: 1.1 (ref 0.8–1.2)
Prothrombin Time: 14.1 seconds (ref 11.4–15.2)

## 2021-08-26 LAB — CBC WITH DIFFERENTIAL/PLATELET
Abs Immature Granulocytes: 0.13 10*3/uL — ABNORMAL HIGH (ref 0.00–0.07)
Basophils Absolute: 0.1 10*3/uL (ref 0.0–0.1)
Basophils Relative: 0 %
Eosinophils Absolute: 0 10*3/uL (ref 0.0–0.5)
Eosinophils Relative: 0 %
HCT: 26 % — ABNORMAL LOW (ref 39.0–52.0)
Hemoglobin: 8.6 g/dL — ABNORMAL LOW (ref 13.0–17.0)
Immature Granulocytes: 1 %
Lymphocytes Relative: 10 %
Lymphs Abs: 1.6 10*3/uL (ref 0.7–4.0)
MCH: 28.6 pg (ref 26.0–34.0)
MCHC: 33.1 g/dL (ref 30.0–36.0)
MCV: 86.4 fL (ref 80.0–100.0)
Monocytes Absolute: 0.6 10*3/uL (ref 0.1–1.0)
Monocytes Relative: 4 %
Neutro Abs: 13.6 10*3/uL — ABNORMAL HIGH (ref 1.7–7.7)
Neutrophils Relative %: 85 %
Platelets: 343 10*3/uL (ref 150–400)
RBC: 3.01 MIL/uL — ABNORMAL LOW (ref 4.22–5.81)
RDW: 14.4 % (ref 11.5–15.5)
WBC: 16 10*3/uL — ABNORMAL HIGH (ref 4.0–10.5)
nRBC: 0 % (ref 0.0–0.2)

## 2021-08-26 LAB — HEMOGLOBIN AND HEMATOCRIT, BLOOD
HCT: 25.8 % — ABNORMAL LOW (ref 39.0–52.0)
Hemoglobin: 9.1 g/dL — ABNORMAL LOW (ref 13.0–17.0)

## 2021-08-26 NOTE — Discharge Instructions (Signed)
We talked about your risk of bleeding in your bowels from the blood thinner (Plavix) that you were started on after surgery.  This may be causing your bleeding, and we decided together to stop this medication.  You understand that this puts you at higher risk of developing a stroke, as this medicine is used to prevent strokes.  Please continue taking your 81 mg of aspirin, as well as all of your other medications.  It is extremely important that you take your regular medications as prescribed.  I will put a referral into gastroenterology to arrange for a follow-up visit for your bleeding issues.  If you develop any strokelike symptoms, including lightheadedness, difficulty speaking, sudden severe headache, balance problems, loss of vision, numbness or weakness of your arms or legs, please call 911 and come back to the ER immediately.  Likewise, if you notice black or tarry stools, lightheadedness, difficulty breathing, or chest pain, please come back to the ER.  These may be signs of new bleeding in your bowels or anemia.

## 2021-08-26 NOTE — ED Triage Notes (Signed)
Pt arrives POV for eval of GI bleed. Pt was at [facility] yesterday, rec'd 2 units blood and then left AMA because he was told itwould be 3 days for bed. Endorses ongoing melena, was on plavix, has not taken it since Monday.

## 2021-08-26 NOTE — ED Provider Notes (Signed)
Emergency Medicine Provider Triage Evaluation Note  Raymond [name] , a 60 y.o. male  was evaluated in triage.  Pt complains of feeling weak.  He was seen at [facility] for GI bleed.  He was given 2 units of blood and then left AGAINST MEDICAL ADVICE. He states he left yesterday, he is here today as he still feels weak.  He states he still has melena.  Has not taken his Plavix since Monday.  No fevers.  Review of Systems  Positive: See above Negative:   Physical Exam  BP (!) 196/70 (BP Location: Left Arm)    Pulse (!) 115    Temp 97.9 F (36.6 C) (Oral)    Resp 18    Ht 6' (1.829 m)    Wt 76 kg    SpO2 99%    BMI 22.72 kg/m  Gen:   Awake, no distress   Resp:  Normal effort  MSK:   Moves extremities without difficulty  Other:  Awake and alert.  Speech is not slurred.  Medical Decision Making  Medically screening exam initiated at 4:09 PM.  Appropriate orders placed.  CHRISTOPHER GLASSCOCK was informed that the remainder of the evaluation will be completed by another provider, this initial triage assessment does not replace that evaluation, and the importance of remaining in the ED until their evaluation is complete.  Note: Portions of this report may have been transcribed using voice recognition software. Every effort was made to ensure accuracy; however, inadvertent computerized transcription errors may be present    Lorin Glass, PA-C 08/26/21 1610    Tegeler, Gwenyth Allegra, MD 08/26/21 [phone]

## 2021-08-26 NOTE — ED Provider Notes (Signed)
[facility] EMERGENCY DEPARTMENT Provider Note   CSN: [id number] Arrival date & time: 08/26/21  1307     History Chief Complaint  Patient presents with   GI Bleeding    Raymond [name] is a 60 y.o. male presenting the emergency department for a follow-up visit with concern for possible GI bleed.  The patient had been admitted to [facility] 2 days ago in the ED for suspected GI bleed.  Per my review of the medical records, the patient was Hemoccult positive, had melena on his rectal exam, and a hemoglobin of 5.4 and was transfused blood.  Patient subsequently left AGAINST MEDICAL ADVICE 3 days ago as he was informed that it may be several days before he could be transferred to another hospital for GI evaluation.  Did not want a wait in the hospital.  He reports that since getting home, he stopped taking all of his medications, including his aspirin and Plavix, and he has noted that his stools have improved.  He denies any new symptoms.  He reported his doctor told him to come to [facility] emergency department today because after his vascular surgeons at his procedure.  Per chart review, the patient had a right TCAR by Dr Donzetta Matters on 08/14/21.  He had been taking Plavix a month prior, was told to continue the Plavix for a month after the procedure, in addition to 81 mg of aspirin.  He has never had a colonoscopy, denies any prior history of GI bleeding that he is aware of.  HPI     Past Medical History:  Diagnosis Date   Cancer ([place])    melanoma   Carotid artery occlusion    Cervical spondylosis with myelopathy    Arthritis   CKD (chronic kidney disease)    stage III (07/2021)   COPD (chronic obstructive pulmonary disease) ([place]) 2022   Diabetes mellitus    Type II   Hyperlipidemia    Hypertension    Impotence of organic origin    Insomnia    Peripheral arterial disease ([place])    Restless leg syndrome    Sleep apnea     Patient Active Problem List    Diagnosis Date Noted   Asymptomatic carotid artery stenosis without infarction, right 08/13/2021   Hypertension 08/10/2021   Hyperlipidemia 08/10/2021   Atherosclerosis of native arteries of the extremities with intermittent claudication 04/23/2012    Past Surgical History:  Procedure Laterality Date   APPENDECTOMY     years ago   EYE SURGERY  2017   cataracts   spinal fusion  09/12/1998   SPINE SURGERY  09/12/1998   Cervical Myelopathy- S/P fusion   TRANSCAROTID ARTERY REVASCULARIZATION  Right 08/13/2021   Procedure: RIGHT TRANSCAROTID ARTERY REVASCULARIZATION;  Surgeon: Waynetta Sandy, MD;  Location: [facility];  Service: Vascular;  Laterality: Right;   ULTRASOUND GUIDANCE FOR VASCULAR ACCESS Left 08/13/2021   Procedure: ULTRASOUND GUIDANCE FOR VASCULAR ACCESS;  Surgeon: Waynetta Sandy, MD;  Location: [facility] OR;  Service: Vascular;  Laterality: Left;       Family History  Problem Relation Age of Onset   Cancer Mother    Hypertension Father    Hypertension Sister     Social History   Tobacco Use   Smoking status: Every Day    Packs/day: 1.00    Types: Cigarettes   Smokeless tobacco: Never   Tobacco comments:    smokes about 3 cigs in last month  Vaping Use  Vaping Use: Never used  Substance Use Topics   Alcohol use: Not Currently   Drug use: No    Home Medications Prior to Admission medications   Medication Sig Start Date End Date Taking? Authorizing Provider  acetaminophen (TYLENOL) 500 MG tablet Take 1,000 mg by mouth every 6 (six) hours as needed for mild pain.    [provider]  amLODipine (NORVASC) 10 MG tablet Take 10 mg by mouth daily.    [provider]  aspirin 81 MG tablet Take 81 mg by mouth daily.    [provider]  cilostazol (PLETAL) 100 MG tablet Take 1 tablet (100 mg total) by mouth 2 (two) times daily before a meal. 02/17/21   Serafina Mitchell, MD  diphenhydrAMINE (BENADRYL) 25 MG tablet Take 25 mg by  mouth 2 (two) times daily as needed for allergies.    [provider]  doxazosin (CARDURA) 4 MG tablet Take 1 tablet (4 mg total) by mouth daily. 07/16/21   Elouise Munroe, MD  famotidine (PEPCID) 20 MG tablet Take 20 mg by mouth daily.    [provider]  fluticasone (FLONASE) 50 MCG/ACT nasal spray Place 2 sprays into both nostrils daily. 03/05/21   [provider]  furosemide (LASIX) 20 MG tablet Take 1 tablet (20 mg total) by mouth daily for 3 days. 08/21/21 08/24/21  Serafina Mitchell, MD  lisinopril (PRINIVIL,ZESTRIL) 40 MG tablet Take 40 mg by mouth daily.    [provider]  metFORMIN (GLUCOPHAGE) 1000 MG tablet Take 0.5 tablets (500 mg total) by mouth 2 (two) times daily with a meal. 08/14/21   Rhyne, Samantha J, PA-C  metoprolol succinate (TOPROL-XL) 50 MG 24 hr tablet Take 50 mg by mouth daily. 03/11/21   [provider]  Multiple Vitamin (MULTIVITAMIN WITH MINERALS) TABS tablet Take 1 tablet by mouth daily.    [provider]  oxyCODONE-acetaminophen (PERCOCET) 5-325 MG tablet Take 1 tablet by mouth every 6 (six) hours as needed for severe pain. Patient not taking: Reported on 08/23/2021 08/14/21   Gabriel Earing, PA-C  polyvinyl alcohol (LIQUIFILM TEARS) 1.4 % ophthalmic solution 1 drop as needed for dry eyes.    [provider]  rosuvastatin (CRESTOR) 20 MG tablet Take 2 tablets (40 mg total) by mouth daily. 08/14/21   Rhyne, Hulen Shouts, PA-C  SPIRIVA HANDIHALER 18 MCG inhalation capsule Place 18 mcg into inhaler and inhale daily. 03/10/21   [provider]  traZODone (DESYREL) 100 MG tablet Take 100 mg by mouth at bedtime.    [provider]    Allergies    Patient has no known allergies.  Review of Systems   Review of Systems  Constitutional:  Negative for chills and fever.  HENT:  Negative for ear pain and sore throat.   Eyes:  Negative for pain and visual disturbance.  Respiratory:  Negative for  cough and shortness of breath.   Cardiovascular:  Negative for chest pain and palpitations.  Gastrointestinal:  Negative for abdominal pain and vomiting.  Genitourinary:  Negative for dysuria and hematuria.  Musculoskeletal:  Negative for arthralgias and back pain.  Skin:  Negative for color change and rash.  Neurological:  Negative for syncope, light-headedness and headaches.  All other systems reviewed and are negative.  Physical Exam Updated Vital Signs BP (!) 191/85    Pulse (!) 108    Temp 97.9 F (36.6 C) (Oral)    Resp 16    Ht 6' (1.829  m)    Wt 76 kg    SpO2 99%    BMI 22.72 kg/m   Physical Exam Constitutional:      General: He is not in acute distress. HENT:     Head: Normocephalic and atraumatic.  Eyes:     Conjunctiva/sclera: Conjunctivae normal.     Pupils: Pupils are equal, round, and reactive to light.  Cardiovascular:     Rate and Rhythm: Normal rate and regular rhythm.  Pulmonary:     Effort: Pulmonary effort is normal. No respiratory distress.  Abdominal:     General: There is no distension.     Tenderness: There is no abdominal tenderness.  Skin:    General: Skin is warm and dry.  Neurological:     General: No focal deficit present.     Mental Status: He is alert. Mental status is at baseline.  Psychiatric:        Mood and Affect: Mood normal.        Behavior: Behavior normal.    ED Results / Procedures / Treatments   Labs (all labs ordered are listed, but only abnormal results are displayed) Labs Reviewed  COMPREHENSIVE METABOLIC PANEL - Abnormal; Notable for the following components:      Result Value   CO2 21 (*)    Glucose, Bld 196 (*)    Creatinine, Ser 1.88 (*)    Calcium 8.8 (*)    Total Protein 5.4 (*)    Albumin 2.9 (*)    AST 13 (*)    GFR, Estimated 40 (*)    All other components within normal limits  CBC WITH DIFFERENTIAL/PLATELET - Abnormal; Notable for the following components:   WBC 16.0 (*)    RBC 3.01 (*)    Hemoglobin 8.6  (*)    HCT 26.0 (*)    Neutro Abs 13.6 (*)    Abs Immature Granulocytes 0.13 (*)    All other components within normal limits  HEMOGLOBIN AND HEMATOCRIT, BLOOD - Abnormal; Notable for the following components:   Hemoglobin 9.1 (*)    HCT 25.8 (*)    All other components within normal limits  PROTIME-INR  TYPE AND SCREEN    EKG None  Radiology No results found.  Procedures Procedures   Medications Ordered in ED Medications - No data to display  ED Course  I have reviewed the triage vital signs and the nursing notes.  Pertinent labs & imaging results that were available during my care of the patient were reviewed by me and considered in my medical decision making (see chart for details).  I personally reviewed the patient's prior medical records as noted above.   I reviewed his labs.  Patient hemoglobin of 8.6 on arrival, which substantially improved from his admission hemoglobin at [facility] [place] 3 days ago.  This level appears to be stable from his discharge.  We repeated his hemoglobin after 7 hours in the ED, and remained stable at 9.0, within the margin of error.  I doubt he is having a brisk or active GI bleed.  He was minorly tachycardic, but did not take his metoprolol that he normally takes, and likewise is hypertensive for missing his medications today.  I discussed his medication regimen with Dr Carlis Abbott from vascular surgery.  Dr Carlis Abbott made clear that the stenting company's recommendation support a minimum of 1 month of aspirin and Plavix dual antiplatelet therapy to prevent vascular occlusion or stroke.  However, given the high concern for GI bleed  with life-threatening anemia, which appears to have resolved after discontinuing plavix, we could consider limiting the patient to 81 mg aspirin only.  I did have a discussion regarding the risk and benefits of both options with the patient and his wife.  They would strongly prefer to discontinue the Plavix and continue aspirin  only.  They understand that this does put him at heightened risk of stroke.  I will provide a referral to gastroenterology for colonoscopy or EGD as needed.  Otherwise he is asymptomatic and at baseline, and anticipate stable for discharge.  I strongly advised him to take his medications tonight and resume his regular medications, barring the Plavix.  He verbalized understanding     Final Clinical Impression(s) / ED Diagnoses Final diagnoses:  Anemia, unspecified type    Rx / DC Orders ED Discharge Orders          Ordered    Ambulatory referral to Gastroenterology       Comments: Concern for GI bleeding   08/26/21 2159             Wyvonnia Dusky, MD 08/26/21 2344

## 2021-08-27 LAB — TYPE AND SCREEN
ABO/RH(D): A NEG
Antibody Screen: NEGATIVE

## 2021-08-31 DIAGNOSIS — I10 Essential (primary) hypertension: Secondary | ICD-10-CM | POA: Diagnosis not present

## 2021-08-31 DIAGNOSIS — Z95828 Presence of other vascular implants and grafts: Secondary | ICD-10-CM | POA: Diagnosis not present

## 2021-08-31 DIAGNOSIS — Z6822 Body mass index (BMI) 22.0-22.9, adult: Secondary | ICD-10-CM | POA: Diagnosis not present

## 2021-08-31 DIAGNOSIS — E1151 Type 2 diabetes mellitus with diabetic peripheral angiopathy without gangrene: Secondary | ICD-10-CM | POA: Diagnosis not present

## 2021-08-31 DIAGNOSIS — K922 Gastrointestinal hemorrhage, unspecified: Secondary | ICD-10-CM | POA: Diagnosis not present

## 2021-08-31 DIAGNOSIS — N179 Acute kidney failure, unspecified: Secondary | ICD-10-CM | POA: Diagnosis not present

## 2021-08-31 DIAGNOSIS — I6529 Occlusion and stenosis of unspecified carotid artery: Secondary | ICD-10-CM | POA: Diagnosis not present

## 2021-08-31 DIAGNOSIS — E871 Hypo-osmolality and hyponatremia: Secondary | ICD-10-CM | POA: Diagnosis not present

## 2021-08-31 DIAGNOSIS — I97638 Postprocedural hematoma of a circulatory system organ or structure following other circulatory system procedure: Secondary | ICD-10-CM | POA: Diagnosis not present

## 2021-08-31 DIAGNOSIS — R600 Localized edema: Secondary | ICD-10-CM | POA: Diagnosis not present

## 2021-08-31 DIAGNOSIS — N183 Chronic kidney disease, stage 3 unspecified: Secondary | ICD-10-CM | POA: Diagnosis not present

## 2021-09-03 ENCOUNTER — Other Ambulatory Visit: Payer: Self-pay

## 2021-09-08 ENCOUNTER — Encounter (HOSPITAL_COMMUNITY): Payer: Medicare Other

## 2021-09-09 ENCOUNTER — Telehealth: Payer: Self-pay | Admitting: Nurse Practitioner

## 2021-09-09 ENCOUNTER — Encounter: Payer: Self-pay | Admitting: Nurse Practitioner

## 2021-09-09 ENCOUNTER — Ambulatory Visit (INDEPENDENT_AMBULATORY_CARE_PROVIDER_SITE_OTHER): Payer: Medicare Other | Admitting: Nurse Practitioner

## 2021-09-09 ENCOUNTER — Other Ambulatory Visit (INDEPENDENT_AMBULATORY_CARE_PROVIDER_SITE_OTHER): Payer: Medicare Other

## 2021-09-09 VITALS — BP 170/64 | HR 99 | Ht 72.0 in | Wt 162.0 lb

## 2021-09-09 DIAGNOSIS — D509 Iron deficiency anemia, unspecified: Secondary | ICD-10-CM | POA: Insufficient documentation

## 2021-09-09 DIAGNOSIS — K921 Melena: Secondary | ICD-10-CM | POA: Diagnosis not present

## 2021-09-09 DIAGNOSIS — D649 Anemia, unspecified: Secondary | ICD-10-CM

## 2021-09-09 LAB — COMPREHENSIVE METABOLIC PANEL
ALT: 15 U/L (ref 0–53)
AST: 11 U/L (ref 0–37)
Albumin: 3.3 g/dL — ABNORMAL LOW (ref 3.5–5.2)
Alkaline Phosphatase: 79 U/L (ref 39–117)
BUN: 16 mg/dL (ref 6–23)
CO2: 25 mEq/L (ref 19–32)
Calcium: 9 mg/dL (ref 8.4–10.5)
Chloride: 102 mEq/L (ref 96–112)
Creatinine, Ser: 1.81 mg/dL — ABNORMAL HIGH (ref 0.40–1.50)
GFR: 39.45 mL/min — ABNORMAL LOW (ref >60.00)
Glucose, Bld: 192 mg/dL — ABNORMAL HIGH (ref 70–99)
Potassium: 4.2 mEq/L (ref 3.5–5.1)
Sodium: 131 mEq/L — ABNORMAL LOW (ref 135–145)
Total Bilirubin: 0.3 mg/dL (ref 0.2–1.2)
Total Protein: 5.9 g/dL — ABNORMAL LOW (ref 6.0–8.3)

## 2021-09-09 LAB — IBC + FERRITIN
Ferritin: 10.7 ng/mL — ABNORMAL LOW (ref 22.0–322.0)
Iron: 14 ug/dL — ABNORMAL LOW (ref 42–165)
Saturation Ratios: 3.9 % — ABNORMAL LOW (ref 20.0–50.0)
TIBC: 359.8 ug/dL (ref 250.0–450.0)
Transferrin: 257 mg/dL (ref 212.0–360.0)

## 2021-09-09 LAB — CBC
HCT: 25.4 % — ABNORMAL LOW (ref 39.0–52.0)
Hemoglobin: 8.6 g/dL — ABNORMAL LOW (ref 13.0–17.0)
MCHC: 33.7 g/dL (ref 30.0–36.0)
MCV: 83.1 fl (ref 78.0–100.0)
Platelets: 308 10*3/uL (ref 150.0–400.0)
RBC: 3.05 Mil/uL — ABNORMAL LOW (ref 4.22–5.81)
RDW: 15.6 % — ABNORMAL HIGH (ref 11.5–15.5)
WBC: 8.5 10*3/uL (ref 4.0–10.5)

## 2021-09-09 MED ORDER — FERROUS SULFATE 325 (65 FE) MG PO TBEC: 325.0000 mg | DELAYED_RELEASE_TABLET | Freq: Every day | ORAL | 0 refills | Status: AC

## 2021-09-09 MED ORDER — PANTOPRAZOLE SODIUM 20 MG PO TBEC: 20.0000 mg | DELAYED_RELEASE_TABLET | Freq: Every day | ORAL | 1 refills | Status: DC

## 2021-09-09 NOTE — Telephone Encounter (Signed)
Per Jaclyn Shaggy, Iron tablet 325 mg daily has been sent to the pharmacy. If not covered by insurance then patient can pay out of pocket. Refill for Pantoprazole sent. Left this information on identified VM.

## 2021-09-09 NOTE — Telephone Encounter (Signed)
Spoke with pt he states that swelling has resolved and he has no swelling or SOB. He will start weighing himself daily for our review. He will bring this with him to scheduled appt 1-16(pre-op clearance should be done at this appointment) and 1-17 w/pharmacist. He will call back if sx worsen or should develop additional sx. He states that sometimes has transportation issues. Informed pt that if he calls [place] in a timely manner, we should be able to help.

## 2021-09-09 NOTE — Patient Instructions (Addendum)
If you are age 62 or younger, your body mass index should be between 19-25. Your Body mass index is 21.97 kg/m. If this is out of the aformentioned range listed, please consider follow up with your Primary Care Provider.   The [place] [facility] providers would like to encourage you to use [facility] to communicate with providers for non-urgent requests or questions.  Due to long hold times on the telephone, sending your provider a message by [facility] may be faster and more efficient way to get a response. Please allow 48 business hours for a response.  Please remember that this is for non-urgent requests/questions.  LABS:  Lab work has been ordered for you today. Our lab is located in the basement. Press "B" on the elevator. The lab is located at the first door on the left as you exit the elevator.  HEALTHCARE LAWS AND MY CHART RESULTS: Due to recent changes in healthcare laws, you may see the results of your imaging and laboratory studies on MyChart before your provider has had a chance to review them.   We understand that in some cases there may be results that are confusing or concerning to you. Not all laboratory results come back in the same time frame and the provider may be waiting for multiple results in order to interpret others.  Please give [place] 48 hours in order for your provider to thoroughly review all the results before contacting the office for clarification of your results.   RECOMMENDATIONS: Start the Pantoprazole that you have at home. Take once a day as previously prescribed. We will contact you regarding scheduling any procedures. Please contact our office if black stools recur.  It was great seeing you today! Thank you for entrusting me with your care and choosing [facility].  Noralyn Pick, CRNP

## 2021-09-09 NOTE — Telephone Encounter (Signed)
Patients wife called asking if the patient is able to take OTC iron supplements and what dosage also said he would need a new script sent for the Pantoprazole. [facility] in [place]

## 2021-09-09 NOTE — Telephone Encounter (Signed)
We received this message in the preop pool. Upon chart review, it appears the patient is having an issue with leg swelling and needs an office visit. I am forwarding the message to [facility] Triage for telephone assessment and appointment if necessary.

## 2021-09-09 NOTE — Progress Notes (Signed)
I agree with the assessment and plan as outlined by [name]. 

## 2021-09-09 NOTE — Progress Notes (Addendum)
09/09/2021 ANTHONEY SHEPPARD [id number] 03/29/1961   CHIEF COMPLAINT: Anemia   HISTORY OF PRESENT ILLNESS: Tabb Croghan is a 60 year old male with a past medical history of hypertension, hyperlipidemia, right carotid artery disease s/p right transcarotid artery stent 08/13/2021 on Plavix, peripheral arterial disease, COPD, sleep apnea does not use cpap, DM II, CKD stage III, arthritis, melanoma on back s/p excision 1985.  He presents to our office today as referred by Dr.Matthew Trifan for further evaluation regarding anemia and black stools. History of right carotid artery disease. He was started on Plavix early 07/2021 and subsequently underwent a right transcarotid stent by Dr. Servando Snare on 08/13/2021. Plavix was to be continued for one month post op and he was to remain on ASA 81mg  indefinitely. He developed bilateral LE edema post op which resolved after he took a few doses of Lasix. He started passing loose stools once daily a few days post TCAR placement. He developed profound weakness and labs done by his PCP showed a Hg level of 6.1 and he was sent directly to the ED. He went to [facility] ED on 08/24/2021. Hg level was 5.4. FOBT+.  He was transfused 2 units of PRBCs and he left against medical advise. He presented to [facility] ED on 08/26/2021 with continued generalized weakness and black stool. Last took Plavix on 08/23/2021. His Hg level was stable at 8.6 and repeat Hg 9.1. Dr. Langston Masker contacted Dr. Carlis Abbott from vascular surgery, their discussion as follows:  "Dr Carlis Abbott made clear that the stenting company's recommendation support a minimum of 1 month of aspirin and Plavix dual antiplatelet therapy to prevent vascular occlusion or stroke.  However, given the high concern for GI bleed with life-threatening anemia, which appears to have resolved after discontinuing plavix, we could consider limiting the patient to 81 mg aspirin only.  I did have a discussion regarding the  risk and benefits of both options with the patient and his wife.  They would strongly prefer to discontinue the Plavix and continue aspirin only.  They understand that this does put him at heightened risk of stroke"   Hg 12.1 on 07/07/2021 Hg 10.4 1 on 08/09/2021 Hg 8.6 on 08/14/2021  He denies having any dysphagia.  He has infrequent heartburn if he eats spicy foods.  No heartburn for the past few months.  No upper or lower abdominal pain.  He takes ASA 81 mg daily as prescribed.  No other NSAID use.  He remains off Plavix since 08/23/2021.  He was prescribed and resolved 20 mg daily which she has not yet started.  He denies ever having an EGD or screening colonoscopy.  His black stools stopped about 1 week ago.  He is passing a normal formed brown bowel movement for the past 2-week.  No bright red rectal bleeding.  No chest pain, palpitations, dizziness or shortness of breath.  CBC Latest Ref Rng & Units 08/26/2021 08/26/2021 08/14/2021  WBC 4.0 - 10.5 K/uL - 16.0(H) 13.7(H)  Hemoglobin 13.0 - 17.0 g/dL 9.1(L) 8.6(L) 8.6(L)  Hematocrit 39.0 - 52.0 % 25.8(L) 26.0(L) 25.6(L)  Platelets 150 - 400 K/uL - 343 272    CMP Latest Ref Rng & Units 08/26/2021 08/14/2021 08/09/2021  Glucose 70 - 99 mg/dL 196(H) 174(H) 126(H)  BUN 8 - 23 mg/dL 21 21 14   Creatinine 0.61 - 1.24 mg/dL 1.88(H) 2.29(H) 1.93(H)  Sodium 135 - 145 mmol/L 135 129(L) 130(L)  Potassium 3.5 - 5.1 mmol/L 3.5  4.6 4.6  Chloride 98 - 111 mmol/L 105 101 101  CO2 22 - 32 mmol/L 21(L) 22 23  Calcium 8.9 - 10.3 mg/dL 8.8(L) 8.2(L) 8.8(L)  Total Protein 6.5 - 8.1 g/dL 5.4(L) - 5.9(L)  Total Bilirubin 0.3 - 1.2 mg/dL 0.5 - 0.3  Alkaline Phos 38 - 126 U/L 70 - 80  AST 15 - 41 U/L 13(L) - 11(L)  ALT 0 - 44 U/L 15 - 14    Stress Test 07/13/2021: No ischemia. Probable small area of anterior wall infarct at the apex and mid ventricle which is consistent with known wall motion abnormality on echo and ECG. Stress test is intermediate risk per Dr.  Margaretann Loveless.  ECHO 07/08/2021: Left ventricular ejection fraction, by estimation, is 50 to 55%. The left ventricle has low normal function. The left ventricle demonstrates regional wall motion abnormalities (see scoring diagram/findings for description). There is mild concentric left ventricular hypertrophy. Left ventricular diastolic parameters were normal. There is mild hypokinesis of the left ventricular, basal-mid anterior wall. 1. 2. Right ventricular systolic function is normal. The right ventricular size is normal. The mitral valve is normal in structure. Trivial mitral valve regurgitation. No evidence of mitral stenosis. 3. The aortic valve is tricuspid. Aortic valve regurgitation is not visualized. No aortic stenosis is present. 4. The inferior vena cava is normal in size with greater than 50% respiratory variability, suggesting right atrial pressure of 3 mmHg.  Past Medical History:  Diagnosis Date   Cancer [facility])    melanoma   Carotid artery occlusion    Cervical spondylosis with myelopathy    Arthritis   CKD (chronic kidney disease)    stage III (07/2021)   COPD (chronic obstructive pulmonary disease) ([place]) 2022   Diabetes mellitus    Type II   Hyperlipidemia    Hypertension    Impotence of organic origin    Insomnia    Peripheral arterial disease ([place])    Restless leg syndrome    Sleep apnea    Past Surgical History:  Procedure Laterality Date   APPENDECTOMY     years ago   EYE SURGERY  2017   cataracts   spinal fusion  09/12/1998   SPINE SURGERY  09/12/1998   Cervical Myelopathy- S/P fusion   TRANSCAROTID ARTERY REVASCULARIZATION  Right 08/13/2021   Procedure: RIGHT TRANSCAROTID ARTERY REVASCULARIZATION;  Surgeon: Waynetta Sandy, MD;  Location: [place];  Service: Vascular;  Laterality: Right;   ULTRASOUND GUIDANCE FOR VASCULAR ACCESS Left 08/13/2021   Procedure: ULTRASOUND GUIDANCE FOR VASCULAR ACCESS;  Surgeon: Waynetta Sandy, MD;   Location: [place];  Service: Vascular;  Laterality: Left;   Social History: He smokes 1 to 1/2 ppd x 30 years. No alcohol. No drug use.   Family History: No known family history of esophageal, gastric or colon cancer.  No Known Allergies   Outpatient Encounter Medications as of 09/09/2021  Medication Sig   acetaminophen (TYLENOL) 500 MG tablet Take 1,000 mg by mouth every 6 (six) hours as needed for mild pain.   amLODipine (NORVASC) 10 MG tablet Take 10 mg by mouth daily.   aspirin 81 MG tablet Take 81 mg by mouth daily.   cilostazol (PLETAL) 100 MG tablet Take 1 tablet (100 mg total) by mouth 2 (two) times daily before a meal.   clopidogrel (PLAVIX) 75 MG tablet Take 75 mg by mouth.   diphenhydrAMINE (BENADRYL) 25 MG tablet Take 25 mg by mouth 2 (two) times daily as needed for allergies.  doxazosin (CARDURA) 4 MG tablet Take 1 tablet (4 mg total) by mouth daily.   famotidine (PEPCID) 20 MG tablet Take 20 mg by mouth daily.   fluticasone (FLONASE) 50 MCG/ACT nasal spray Place 2 sprays into both nostrils daily.   furosemide (LASIX) 20 MG tablet Take 1 tablet (20 mg total) by mouth daily for 3 days.   lisinopril (PRINIVIL,ZESTRIL) 40 MG tablet Take 40 mg by mouth daily.   metFORMIN (GLUCOPHAGE) 1000 MG tablet Take 0.5 tablets (500 mg total) by mouth 2 (two) times daily with a meal.   metoprolol succinate (TOPROL-XL) 50 MG 24 hr tablet Take 50 mg by mouth daily.   Multiple Vitamin (MULTIVITAMIN WITH MINERALS) TABS tablet Take 1 tablet by mouth daily.   oxyCODONE-acetaminophen (PERCOCET) 5-325 MG tablet Take 1 tablet by mouth every 6 (six) hours as needed for severe pain. (Patient not taking: Reported on 08/23/2021)   pantoprazole (PROTONIX) 20 MG tablet Take 20 mg by mouth daily.   polyvinyl alcohol (LIQUIFILM TEARS) 1.4 % ophthalmic solution 1 drop as needed for dry eyes.   rosuvastatin (CRESTOR) 20 MG tablet Take 2 tablets (40 mg total) by mouth daily.   SPIRIVA HANDIHALER 18 MCG inhalation  capsule Place 18 mcg into inhaler and inhale daily.   traZODone (DESYREL) 100 MG tablet Take 100 mg by mouth at bedtime.   No facility-administered encounter medications on file as of 09/09/2021.    REVIEW OF SYSTEMS:  Gen: Denies fever, sweats or chills. No weight loss.  CV: Denies chest pain, palpitations or edema. Resp: Denies cough, shortness of breath of hemoptysis.  GI: See HPI. GU : Denies urinary burning, blood in urine, increased urinary frequency or incontinence. MS: Denies joint pain, muscles aches or weakness. Derm: Denies rash, itchiness, skin lesions or unhealing ulcers. Psych: Denies depression, anxiety or memory loss. Heme: Denies bruising, bleeding. Neuro:  Denies headaches, dizziness or paresthesias. Endo:  + DM II.  PHYSICAL EXAM: BP (!) 170/64    Pulse 99    Ht 6' (1.829 m)    Wt 162 lb (73.5 kg)    BMI 21.97 kg/m   General: 60 year old male in no acute distress. Head: Normocephalic and atraumatic. Eyes:  Sclerae non-icteric, conjunctive pink. Ears: Normal auditory acuity. Mouth: Dentition intact. No ulcers or lesions.  Neck: Supple, no lymphadenopathy or thyromegaly. Right and left bruit.  Lungs: Clear bilaterally to auscultation without wheezes, crackles or rhonchi. Chest: Fading ecchymosis to the right mid upper chest.  Heart: Regular rate and rhythm. Soft systolic murmur. No rub or gallop appreciated.  Abdomen: Soft, nontender, non distended. No masses. No hepatosplenomegaly. Normoactive bowel sounds x 4 quadrants. + Abdominal bruit.  Rectal: Deferred.  Musculoskeletal: Symmetrical with no gross deformities. Skin: Warm and dry. No rash or lesions on visible extremities. Extremities: No edema. Neurological: Alert oriented x 4, no focal deficits.  Psychological:  Alert and cooperative. Normal mood and affect.  ASSESSMENT AND PLAN:  25) 60 year old male with normocytic anemia since at least 06/2021, carotid artery disease s/p right TCAR by Dr. Donzetta Matters  08/14/2021 on Plavix and ASA who subsequently developed melena with profound anemia. Hg 5.4 -> Transfused 2 units of PRBCs at [facility] ED on 08/24/2021. Patient left ED against medical advised. He went to [facility] ED on 08/26/2021. -> Hg level 8.6 -> 9.0 (Hg 10.4 on 08/09/2021). Plavix was discontinued and ASA was continued. See HPI. No further melena x 7 days. No abdominal pain. Never had an EGD or screening colonoscopy. -CBC,  iron, iron saturation, TIBC and ferritin level  -EGD and colonoscopy at [facility] with Dr. Lorenso Courier. EGD and colonoscopy benefits and risks discussed including risk with sedation, risk of bleeding, perforation and infection. Cardiac clearance required prior to proceeding with EGD and colonoscopy -Encouraged the patient to start Pantoprazole 20 mg 1 p.o. daily as previously prescribed the ED physician -Patient to contact her office if melena recurs -I attempted to contact vascular surgeon Dr. Donzetta Matters regarding bilateral carotid bruit and abdominal bruit auscultated on exam today, await response. ADDENDUM: I contacted vascular surgeon Dr. Trula Slade, his office will contact patient to schedule a carotid doppler study. He recommended cardiac follow up as patient had new onset LE edema post right TCAR for which he prescribed Lasix.  -I consulted with Dr. Lorenso Courier and she agreed with the above plan  2) Colon cancer screening -Eventual colonoscopy, see plan in # 1  3) COPD, stable  4) DM  5) CKD stage III      CC:  Burdine, Virgina Evener, MD

## 2021-09-09 NOTE — Telephone Encounter (Signed)
Raymond [name], refer to today's office visit. Pls contact cardiologist who is covering for Dr. Margaretann Loveless, Nadean Corwin and let them know patient developed LE edema s/p right carotid stent placement 08/13/2021. He has bilateral carotid bruit and abd bruit on exam in our GI clinic today. Pls have cardiology see him, provide cardiac clearance prior to EGD/colonoscopy for IDA/UGI bleed. Note patient will have repeat carotid doppler per vascular surgery as well.

## 2021-09-12 DIAGNOSIS — Z20822 Contact with and (suspected) exposure to covid-19: Secondary | ICD-10-CM | POA: Diagnosis not present

## 2021-09-14 ENCOUNTER — Telehealth (HOSPITAL_BASED_OUTPATIENT_CLINIC_OR_DEPARTMENT_OTHER): Payer: Self-pay | Admitting: Family

## 2021-09-14 NOTE — Telephone Encounter (Signed)
Received additional GI clinic requesting earlier appointment patient.  He is currently scheduled for 09/27/2021.  I do currently have an opening for Friday, 09/17/2021 at 2:45 PM.  Called patient and left voicemail per [facility] offering appointment asked him to call us back.  Loel Dubonnet, NP

## 2021-09-15 ENCOUNTER — Other Ambulatory Visit: Payer: Self-pay

## 2021-09-15 DIAGNOSIS — I6523 Occlusion and stenosis of bilateral carotid arteries: Secondary | ICD-10-CM

## 2021-09-15 NOTE — Telephone Encounter (Signed)
Called pt on 09/15/21 at 0952 to offer sooner appointment for 09/17/21.  No answer. Left detailed VM per DPR and sent MyChart message.   Loel Dubonnet, NP

## 2021-09-16 ENCOUNTER — Ambulatory Visit (HOSPITAL_COMMUNITY)
Admission: RE | Admit: 2021-09-16 | Discharge: 2021-09-16 | Disposition: A | Discharge status: Home / Self Care | Payer: Medicare Other | Source: Ambulatory Visit | Attending: Nurse Practitioner | Admitting: Nurse Practitioner

## 2021-09-16 ENCOUNTER — Other Ambulatory Visit: Payer: Self-pay

## 2021-09-16 DIAGNOSIS — I6523 Occlusion and stenosis of bilateral carotid arteries: Secondary | ICD-10-CM | POA: Diagnosis not present

## 2021-09-27 ENCOUNTER — Ambulatory Visit (HOSPITAL_BASED_OUTPATIENT_CLINIC_OR_DEPARTMENT_OTHER): Payer: Medicare Other | Admitting: Family

## 2021-09-28 ENCOUNTER — Ambulatory Visit: Payer: Medicare Other

## 2021-09-28 NOTE — Telephone Encounter (Signed)
Patient canceled his preop with cardiology and has not rescheduled.

## 2021-09-29 NOTE — Telephone Encounter (Signed)
Dr. Lorenso Courier, refer to office consult 09/09/2021.  An EGD/colonoscopy was recommended after cardiac clearance received.  His carotid duplex showed a patent right carotid stent and minimal stenosis to the left carotid artery.  He was scheduled to see his cardiologist on 09/27/2021 but looks like he canceled this appointment.  I attempted to contact the patient at this time to determine's  if he wishes to proceed with the recommended EGD and colonoscopy due to his profound anemia but he did not answer the phone. I left a message for him to call me back.  I wanted to keep you in the loop and if you have further recommendations regarding his GI evaluation and follow-up please let me know.

## 2021-09-30 NOTE — Telephone Encounter (Signed)
LM for patient

## 2021-09-30 NOTE — Telephone Encounter (Signed)
Melissa, please contact the patient and let him know we recommend for him to reschedule appoint with his cardiologist for cardiac clearance prior to pursuing an EGD and colonoscopy.  If you are unable to reach the patient by phone, please send him a letter with this information.  Thank you

## 2021-10-14 NOTE — Telephone Encounter (Signed)
Called patient again, no answer. VM left. MyChart message also sent.  It appears that cardiology have been attempting to get in touch with him as well, and have not been successful.

## 2021-10-19 DIAGNOSIS — F17210 Nicotine dependence, cigarettes, uncomplicated: Secondary | ICD-10-CM | POA: Diagnosis not present

## 2021-10-19 DIAGNOSIS — E1165 Type 2 diabetes mellitus with hyperglycemia: Secondary | ICD-10-CM | POA: Diagnosis not present

## 2021-10-19 DIAGNOSIS — N183 Chronic kidney disease, stage 3 unspecified: Secondary | ICD-10-CM | POA: Diagnosis not present

## 2021-10-19 DIAGNOSIS — E7801 Familial hypercholesterolemia: Secondary | ICD-10-CM | POA: Diagnosis not present

## 2021-10-19 DIAGNOSIS — E7849 Other hyperlipidemia: Secondary | ICD-10-CM | POA: Diagnosis not present

## 2021-10-19 DIAGNOSIS — E782 Mixed hyperlipidemia: Secondary | ICD-10-CM | POA: Diagnosis not present

## 2021-10-19 DIAGNOSIS — E1169 Type 2 diabetes mellitus with other specified complication: Secondary | ICD-10-CM | POA: Diagnosis not present

## 2021-10-19 DIAGNOSIS — I10 Essential (primary) hypertension: Secondary | ICD-10-CM | POA: Diagnosis not present

## 2021-10-19 DIAGNOSIS — E7800 Pure hypercholesterolemia, unspecified: Secondary | ICD-10-CM | POA: Diagnosis not present

## 2021-10-21 ENCOUNTER — Other Ambulatory Visit: Payer: Self-pay | Admitting: Internal Medicine

## 2021-10-25 IMAGING — CT CT ANGIO NECK
2 of 11 series · 6 of 36 positions shown · IV contrast (Omnipaque or Isovue)
Comparison: Carotid Doppler ultrasound 11/09/2020. Head and
cervical spine CTs 03/17/2016.

CLINICAL DATA: Carotid stenosis.

EXAM:
CT ANGIOGRAPHY HEAD AND NECK
TECHNIQUE: Multidetector CT imaging of the head and neck was performed using
the standard protocol during bolus administration of intravenous
contrast. Multiplanar CT image reconstructions and MIPs were
obtained to evaluate the vascular anatomy. Carotid stenosis
measurements (when applicable) are obtained utilizing NASCET
criteria, using the distal internal carotid diameter as the
denominator.
CONTRAST:  60mL OMNIPAQUE IOHEXOL 350 MG/ML SOLN

[Series 12: ax thins · axial · 0.39mm/px · z∈[-186,+76]mm · 5 of 396 slices shown]
[im 66/396  soft-tissue]
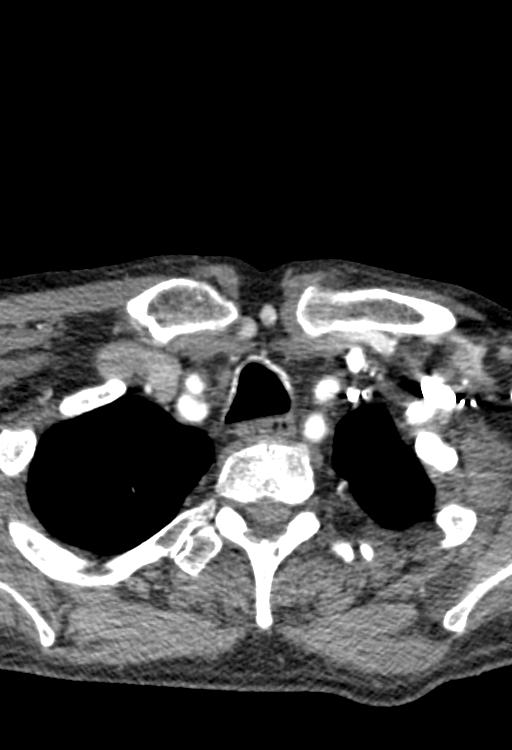
[im 132/396  bone]
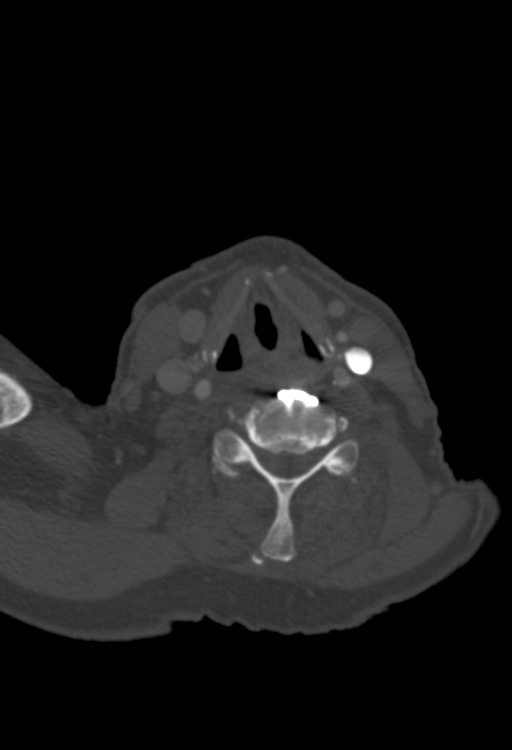
[im 198/396  soft-tissue]
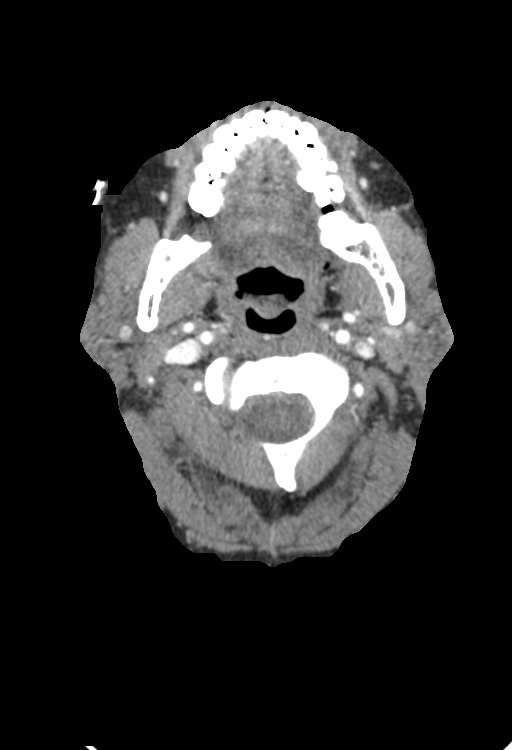
[im 264/396  bone]
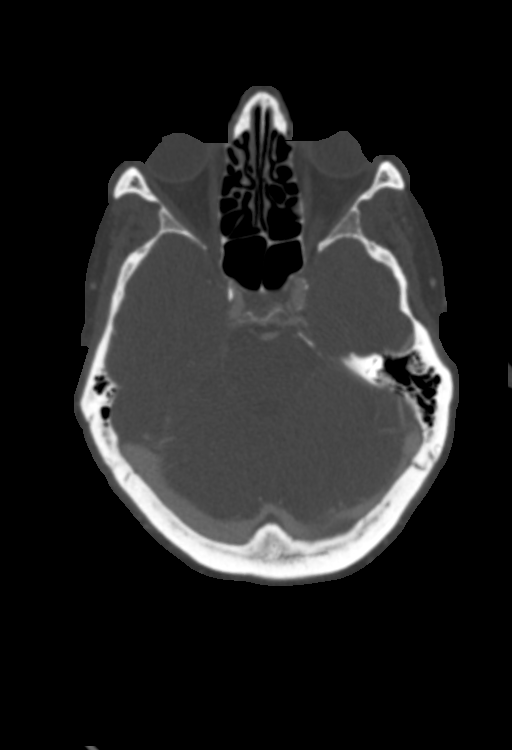
[im 330/396  soft-tissue]
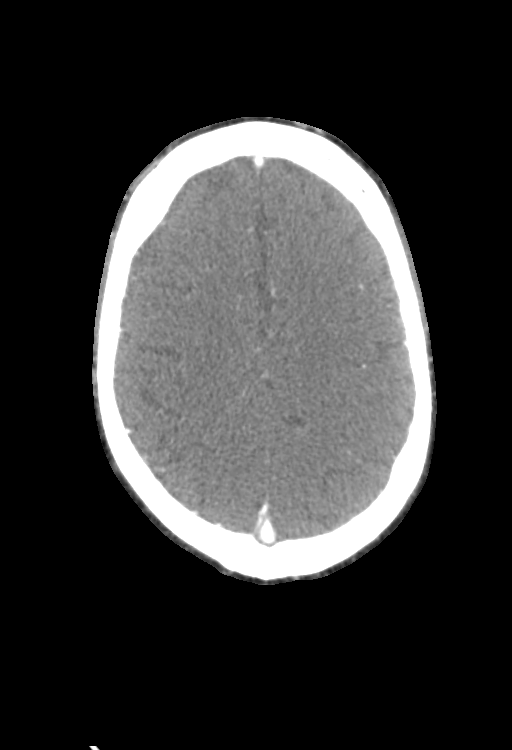

[Series 16: sag thin · sagittal · 0.57mm/px · 1 of 201 slices shown]
[im 93/201  soft-tissue]
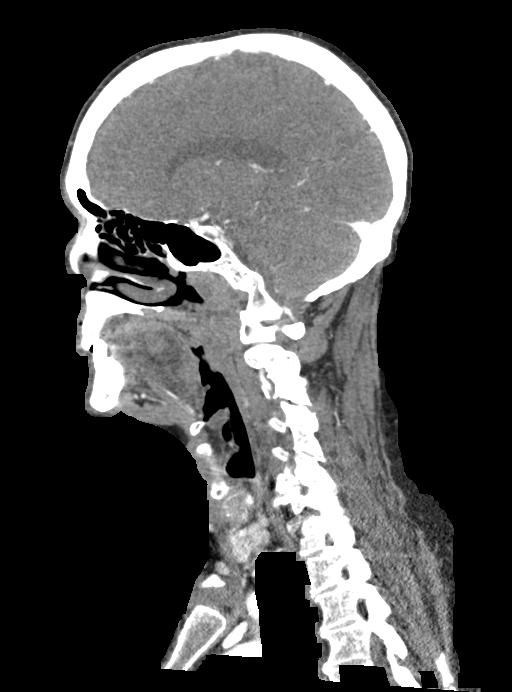

[6 of 36 positions shown; findings below may reference images not displayed]

FINDINGS: CT HEAD FINDINGS

Brain: There is no evidence of an acute infarct, intracranial
hemorrhage, mass, midline shift, or extra-axial fluid collection.
The ventricles and sulci are within normal limits for age. Small
chronic infarcts involving the anterior right basal ganglia/internal
capsule, left thalamus, and posterior left lentiform nucleus are
unchanged. Hypodensities elsewhere in the cerebral white matter
bilaterally are also unchanged and nonspecific but compatible with
mild chronic small vessel ischemic disease. Asymmetric hypodensity
and volume loss are again noted along the atrium and temporal horn
of the left lateral ventricle consistent with encephalomalacia from
a remote insult.

Vascular: Calcified atherosclerosis at the skull base.

Skull: No fracture or suspicious osseous lesion.

Sinuses: Visualized paranasal sinuses and mastoid air cells are
clear.

Orbits: Bilateral cataract extraction.

Review of the MIP images confirms the above findings

CTA NECK FINDINGS

Aortic arch: Normal variant aortic arch branching pattern with
common origin of the brachiocephalic and left common carotid
arteries. Mild-to-moderate atherosclerotic plaque in the aortic arch
and subclavian arteries without significant stenosis.

Right carotid system: Patent with calcified plaque at the carotid
bifurcation and prominent soft plaque in the carotid bulb resulting
in 80% proximal ICA stenosis. Additional calcified and soft plaque
in the distal cervical ICA without significant associated stenosis.

Left carotid system: Patent with a small to moderate amount of
calcified and soft plaque at the carotid bifurcation. No evidence of
a significant stenosis or dissection.

Vertebral arteries: The vertebral arteries are patent with the left
being dominant. Scattered calcified plaque in the left vertebral
artery at its origin as well as throughout the V2 segment results in
up to mild stenosis.

Skeleton: Solid C4-C6 ACDF. Advanced multilevel facet arthrosis with
grade 1 anterolisthesis of C3 on C4, C6 on C7, and C7 on T1.
Advanced disc degeneration at C6-7 with uncovertebral and facet
spurring resulting in moderate bilateral neural foraminal stenosis.

Other neck: No evidence of cervical lymphadenopathy or mass.

Upper chest: Mild dependent atelectasis in the lung apices.

Review of the MIP images confirms the above findings

CTA HEAD FINDINGS

Anterior circulation: The internal carotid arteries are patent from
skull base to carotid termini with atherosclerotic plaque resulting
in moderate right and mild-to-moderate left cavernous stenoses. ACAs
and MCAs are patent without evidence of a proximal branch occlusion.
There are mild bilateral M1 stenoses, and there is a severe stenosis
of the proximal left M2 inferior division. There is diffuse branch
vessel irregularity and attenuation. No aneurysm is identified.

Posterior circulation: The intracranial left vertebral artery is
patent and supplies the basilar with calcified and soft plaque
resulting in moderate to severe proximal left V4 stenosis. The right
vertebral artery functionally terminates in PICA with only a
diminutive, faintly visible vessel continuing distally to the
vertebrobasilar junction with patency not confirmed on this study.
The basilar artery is patent with a mild stenosis in its mid to
distal portion. Posterior communicating arteries are diminutive or
absent. The PCAs are patent but diffusely irregular with multifocal
moderate to severe P2 and P3 stenoses bilaterally. No aneurysm is
identified.

Venous sinuses: Patent.

Anatomic variants: Hypoplastic right vertebral artery.

Review of the MIP images confirms the above findings
IMPRESSION: 1. 80% proximal right ICA stenosis.
2. Moderate left-sided cervical carotid atherosclerosis without
significant stenosis.
3. Advanced intracranial atherosclerosis with moderate to severe
anterior and posterior circulation stenoses as detailed above.
4. No evidence of acute intracranial abnormality. Chronic ischemia
with multiple old infarcts.
5. Aortic Atherosclerosis ([id number]).

## 2021-10-25 IMAGING — CT CT ANGIO HEAD
3 of 12 series · 7 of 36 positions shown · IV contrast (omnipaque)
Comparison: Carotid Doppler ultrasound 11/09/2020. Head and
cervical spine CTs 03/17/2016.

CLINICAL DATA: Carotid stenosis.

EXAM:
CT ANGIOGRAPHY HEAD AND NECK
TECHNIQUE: Multidetector CT imaging of the head and neck was performed using
the standard protocol during bolus administration of intravenous
contrast. Multiplanar CT image reconstructions and MIPs were
obtained to evaluate the vascular anatomy. Carotid stenosis
measurements (when applicable) are obtained utilizing NASCET
criteria, using the distal internal carotid diameter as the
denominator.
CONTRAST:  60mL OMNIPAQUE IOHEXOL 350 MG/ML SOLN

[Series 509: cta head & neck · axial · 0.39mm/px · z∈[-172,+63]mm · 4 of 792 slices shown]
[im 159/792  soft-tissue]
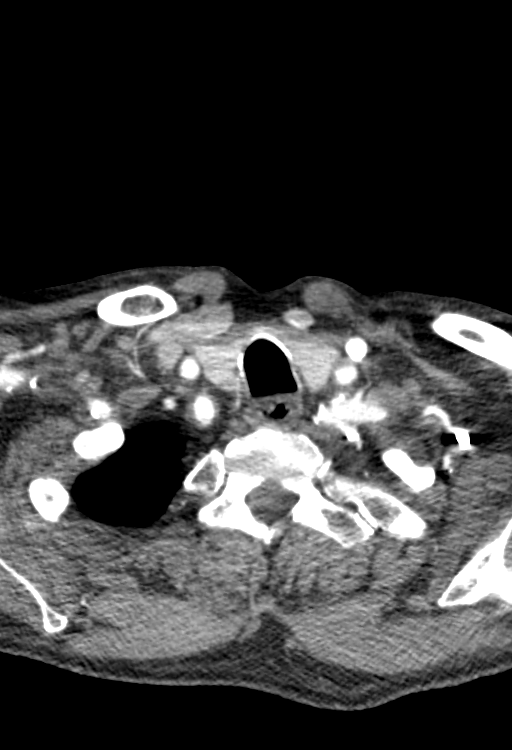
[im 317/792  bone]
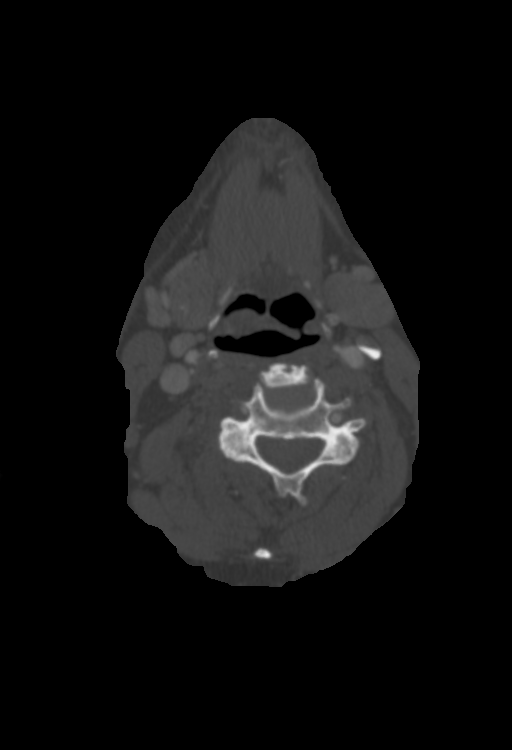
[im 475/792  soft-tissue]
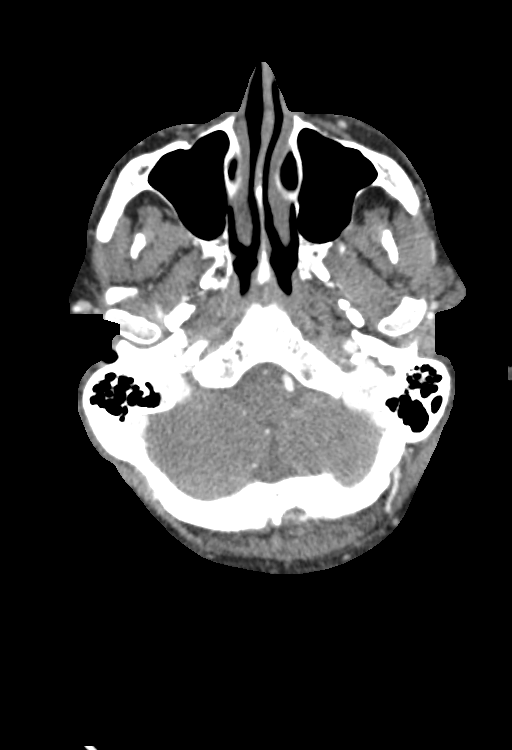
[im 633/792  bone]
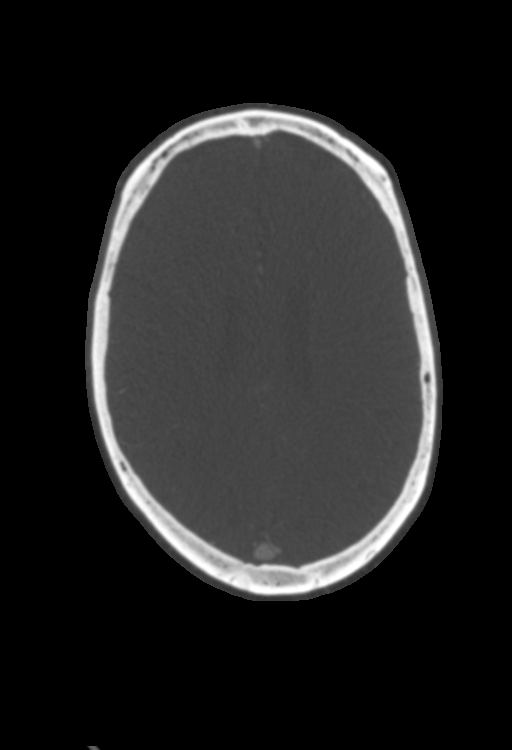

[Series 510: ax thins · axial · 0.39mm/px · z∈[-120,+11]mm · 2 of 396 slices shown]
[im 132/396  soft-tissue]
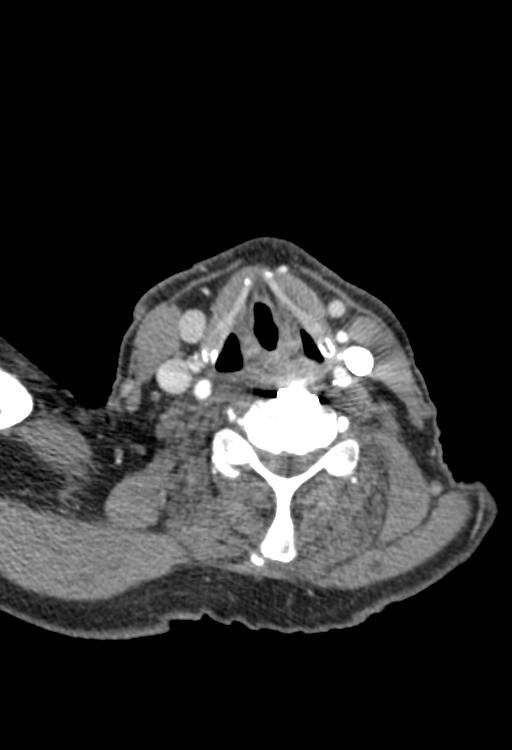
[im 264/396  soft-tissue]
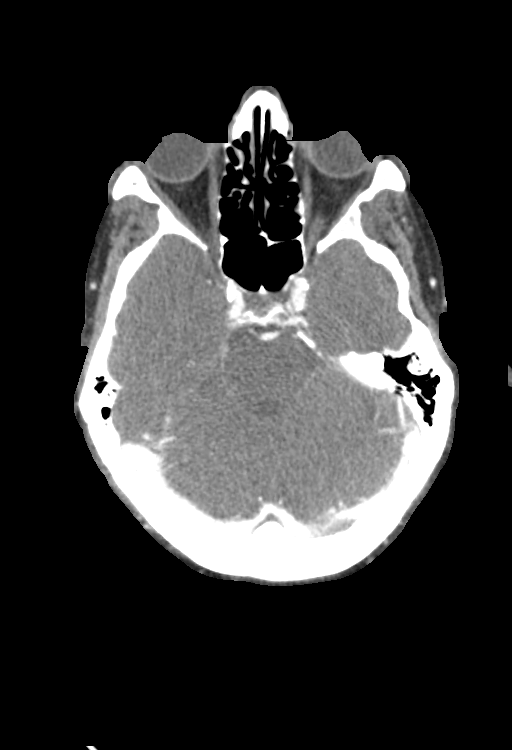

[Series 514: sag thin · sagittal · 0.57mm/px · 1 of 201 slices shown]
[im 93/201  soft-tissue]
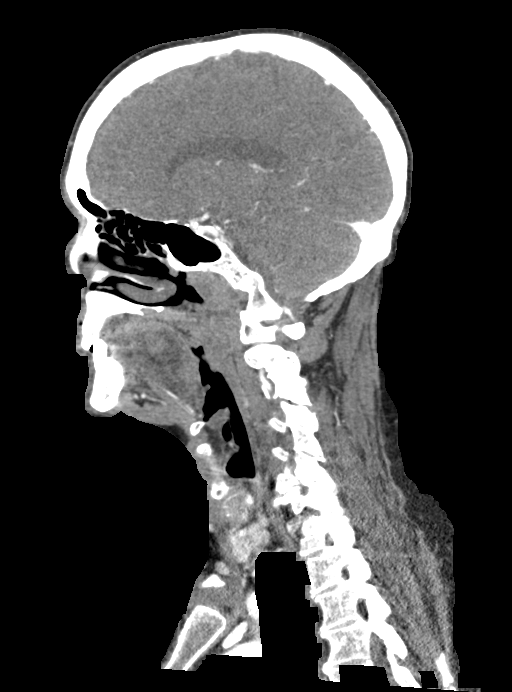

[7 of 36 positions shown; findings below may reference images not displayed]

FINDINGS: CT HEAD FINDINGS

Brain: There is no evidence of an acute infarct, intracranial
hemorrhage, mass, midline shift, or extra-axial fluid collection.
The ventricles and sulci are within normal limits for age. Small
chronic infarcts involving the anterior right basal ganglia/internal
capsule, left thalamus, and posterior left lentiform nucleus are
unchanged. Hypodensities elsewhere in the cerebral white matter
bilaterally are also unchanged and nonspecific but compatible with
mild chronic small vessel ischemic disease. Asymmetric hypodensity
and volume loss are again noted along the atrium and temporal horn
of the left lateral ventricle consistent with encephalomalacia from
a remote insult.

Vascular: Calcified atherosclerosis at the skull base.

Skull: No fracture or suspicious osseous lesion.

Sinuses: Visualized paranasal sinuses and mastoid air cells are
clear.

Orbits: Bilateral cataract extraction.

Review of the MIP images confirms the above findings

CTA NECK FINDINGS

Aortic arch: Normal variant aortic arch branching pattern with
common origin of the brachiocephalic and left common carotid
arteries. Mild-to-moderate atherosclerotic plaque in the aortic arch
and subclavian arteries without significant stenosis.

Right carotid system: Patent with calcified plaque at the carotid
bifurcation and prominent soft plaque in the carotid bulb resulting
in 80% proximal ICA stenosis. Additional calcified and soft plaque
in the distal cervical ICA without significant associated stenosis.

Left carotid system: Patent with a small to moderate amount of
calcified and soft plaque at the carotid bifurcation. No evidence of
a significant stenosis or dissection.

Vertebral arteries: The vertebral arteries are patent with the left
being dominant. Scattered calcified plaque in the left vertebral
artery at its origin as well as throughout the V2 segment results in
up to mild stenosis.

Skeleton: Solid C4-C6 ACDF. Advanced multilevel facet arthrosis with
grade 1 anterolisthesis of C3 on C4, C6 on C7, and C7 on T1.
Advanced disc degeneration at C6-7 with uncovertebral and facet
spurring resulting in moderate bilateral neural foraminal stenosis.

Other neck: No evidence of cervical lymphadenopathy or mass.

Upper chest: Mild dependent atelectasis in the lung apices.

Review of the MIP images confirms the above findings

CTA HEAD FINDINGS

Anterior circulation: The internal carotid arteries are patent from
skull base to carotid termini with atherosclerotic plaque resulting
in moderate right and mild-to-moderate left cavernous stenoses. ACAs
and MCAs are patent without evidence of a proximal branch occlusion.
There are mild bilateral M1 stenoses, and there is a severe stenosis
of the proximal left M2 inferior division. There is diffuse branch
vessel irregularity and attenuation. No aneurysm is identified.

Posterior circulation: The intracranial left vertebral artery is
patent and supplies the basilar with calcified and soft plaque
resulting in moderate to severe proximal left V4 stenosis. The right
vertebral artery functionally terminates in PICA with only a
diminutive, faintly visible vessel continuing distally to the
vertebrobasilar junction with patency not confirmed on this study.
The basilar artery is patent with a mild stenosis in its mid to
distal portion. Posterior communicating arteries are diminutive or
absent. The PCAs are patent but diffusely irregular with multifocal
moderate to severe P2 and P3 stenoses bilaterally. No aneurysm is
identified.

Venous sinuses: Patent.

Anatomic variants: Hypoplastic right vertebral artery.

Review of the MIP images confirms the above findings
IMPRESSION: 1. 80% proximal right ICA stenosis.
2. Moderate left-sided cervical carotid atherosclerosis without
significant stenosis.
3. Advanced intracranial atherosclerosis with moderate to severe
anterior and posterior circulation stenoses as detailed above.
4. No evidence of acute intracranial abnormality. Chronic ischemia
with multiple old infarcts.
5. Aortic Atherosclerosis ([id number]).

## 2021-10-27 DIAGNOSIS — J439 Emphysema, unspecified: Secondary | ICD-10-CM | POA: Diagnosis not present

## 2021-10-27 DIAGNOSIS — I10 Essential (primary) hypertension: Secondary | ICD-10-CM | POA: Diagnosis not present

## 2021-10-27 DIAGNOSIS — Z0001 Encounter for general adult medical examination with abnormal findings: Secondary | ICD-10-CM | POA: Diagnosis not present

## 2021-10-27 DIAGNOSIS — E7849 Other hyperlipidemia: Secondary | ICD-10-CM | POA: Diagnosis not present

## 2021-10-27 DIAGNOSIS — Z23 Encounter for immunization: Secondary | ICD-10-CM | POA: Diagnosis not present

## 2021-10-27 DIAGNOSIS — I6529 Occlusion and stenosis of unspecified carotid artery: Secondary | ICD-10-CM | POA: Diagnosis not present

## 2021-10-27 DIAGNOSIS — E1151 Type 2 diabetes mellitus with diabetic peripheral angiopathy without gangrene: Secondary | ICD-10-CM | POA: Diagnosis not present

## 2021-10-27 DIAGNOSIS — I700 Atherosclerosis of aorta: Secondary | ICD-10-CM | POA: Diagnosis not present

## 2021-11-08 DIAGNOSIS — Z122 Encounter for screening for malignant neoplasm of respiratory organs: Secondary | ICD-10-CM | POA: Diagnosis not present

## 2021-11-08 DIAGNOSIS — F17210 Nicotine dependence, cigarettes, uncomplicated: Secondary | ICD-10-CM | POA: Diagnosis not present

## 2021-11-08 DIAGNOSIS — Z87891 Personal history of nicotine dependence: Secondary | ICD-10-CM | POA: Diagnosis not present

## 2022-01-06 DIAGNOSIS — F17210 Nicotine dependence, cigarettes, uncomplicated: Secondary | ICD-10-CM | POA: Diagnosis not present

## 2022-01-06 DIAGNOSIS — M7989 Other specified soft tissue disorders: Secondary | ICD-10-CM | POA: Diagnosis not present

## 2022-01-06 DIAGNOSIS — Z6823 Body mass index (BMI) 23.0-23.9, adult: Secondary | ICD-10-CM | POA: Diagnosis not present

## 2022-01-10 DIAGNOSIS — M7989 Other specified soft tissue disorders: Secondary | ICD-10-CM | POA: Diagnosis not present

## 2022-01-16 DIAGNOSIS — R609 Edema, unspecified: Secondary | ICD-10-CM | POA: Diagnosis not present

## 2022-01-16 DIAGNOSIS — Z8249 Family history of ischemic heart disease and other diseases of the circulatory system: Secondary | ICD-10-CM | POA: Diagnosis not present

## 2022-01-16 DIAGNOSIS — Z79899 Other long term (current) drug therapy: Secondary | ICD-10-CM | POA: Diagnosis not present

## 2022-01-16 DIAGNOSIS — R059 Cough, unspecified: Secondary | ICD-10-CM | POA: Diagnosis not present

## 2022-01-16 DIAGNOSIS — R600 Localized edema: Secondary | ICD-10-CM | POA: Diagnosis not present

## 2022-01-16 DIAGNOSIS — N189 Chronic kidney disease, unspecified: Secondary | ICD-10-CM | POA: Diagnosis not present

## 2022-01-16 DIAGNOSIS — M7989 Other specified soft tissue disorders: Secondary | ICD-10-CM | POA: Diagnosis not present

## 2022-01-16 DIAGNOSIS — E1122 Type 2 diabetes mellitus with diabetic chronic kidney disease: Secondary | ICD-10-CM | POA: Diagnosis not present

## 2022-01-16 DIAGNOSIS — F17210 Nicotine dependence, cigarettes, uncomplicated: Secondary | ICD-10-CM | POA: Diagnosis not present

## 2022-01-16 DIAGNOSIS — Z7982 Long term (current) use of aspirin: Secondary | ICD-10-CM | POA: Diagnosis not present

## 2022-01-16 DIAGNOSIS — I129 Hypertensive chronic kidney disease with stage 1 through stage 4 chronic kidney disease, or unspecified chronic kidney disease: Secondary | ICD-10-CM | POA: Diagnosis not present

## 2022-01-18 DIAGNOSIS — F17210 Nicotine dependence, cigarettes, uncomplicated: Secondary | ICD-10-CM | POA: Diagnosis not present

## 2022-01-18 DIAGNOSIS — E1151 Type 2 diabetes mellitus with diabetic peripheral angiopathy without gangrene: Secondary | ICD-10-CM | POA: Diagnosis not present

## 2022-01-18 DIAGNOSIS — Z6823 Body mass index (BMI) 23.0-23.9, adult: Secondary | ICD-10-CM | POA: Diagnosis not present

## 2022-01-18 DIAGNOSIS — N183 Chronic kidney disease, stage 3 unspecified: Secondary | ICD-10-CM | POA: Diagnosis not present

## 2022-01-18 DIAGNOSIS — M7989 Other specified soft tissue disorders: Secondary | ICD-10-CM | POA: Diagnosis not present

## 2022-02-15 DIAGNOSIS — E7849 Other hyperlipidemia: Secondary | ICD-10-CM | POA: Diagnosis not present

## 2022-02-15 DIAGNOSIS — E871 Hypo-osmolality and hyponatremia: Secondary | ICD-10-CM | POA: Diagnosis not present

## 2022-02-15 DIAGNOSIS — E1169 Type 2 diabetes mellitus with other specified complication: Secondary | ICD-10-CM | POA: Diagnosis not present

## 2022-02-15 DIAGNOSIS — E782 Mixed hyperlipidemia: Secondary | ICD-10-CM | POA: Diagnosis not present

## 2022-02-17 DIAGNOSIS — M7989 Other specified soft tissue disorders: Secondary | ICD-10-CM | POA: Diagnosis not present

## 2022-02-17 DIAGNOSIS — I700 Atherosclerosis of aorta: Secondary | ICD-10-CM | POA: Diagnosis not present

## 2022-02-17 DIAGNOSIS — I2510 Atherosclerotic heart disease of native coronary artery without angina pectoris: Secondary | ICD-10-CM | POA: Diagnosis not present

## 2022-02-17 DIAGNOSIS — Z95828 Presence of other vascular implants and grafts: Secondary | ICD-10-CM | POA: Diagnosis not present

## 2022-02-17 DIAGNOSIS — E871 Hypo-osmolality and hyponatremia: Secondary | ICD-10-CM | POA: Diagnosis not present

## 2022-02-17 DIAGNOSIS — F17210 Nicotine dependence, cigarettes, uncomplicated: Secondary | ICD-10-CM | POA: Diagnosis not present

## 2022-02-17 DIAGNOSIS — J439 Emphysema, unspecified: Secondary | ICD-10-CM | POA: Diagnosis not present

## 2022-02-17 DIAGNOSIS — E1151 Type 2 diabetes mellitus with diabetic peripheral angiopathy without gangrene: Secondary | ICD-10-CM | POA: Diagnosis not present

## 2022-02-17 DIAGNOSIS — I10 Essential (primary) hypertension: Secondary | ICD-10-CM | POA: Diagnosis not present

## 2022-02-17 DIAGNOSIS — D649 Anemia, unspecified: Secondary | ICD-10-CM | POA: Diagnosis not present

## 2022-02-17 DIAGNOSIS — I6529 Occlusion and stenosis of unspecified carotid artery: Secondary | ICD-10-CM | POA: Diagnosis not present

## 2022-03-07 ENCOUNTER — Other Ambulatory Visit: Payer: Self-pay | Admitting: Surgery

## 2022-03-07 DIAGNOSIS — I779 Disorder of arteries and arterioles, unspecified: Secondary | ICD-10-CM

## 2022-03-24 DIAGNOSIS — R739 Hyperglycemia, unspecified: Secondary | ICD-10-CM | POA: Diagnosis not present

## 2022-03-24 DIAGNOSIS — I6529 Occlusion and stenosis of unspecified carotid artery: Secondary | ICD-10-CM | POA: Diagnosis not present

## 2022-03-24 DIAGNOSIS — F17210 Nicotine dependence, cigarettes, uncomplicated: Secondary | ICD-10-CM | POA: Diagnosis not present

## 2022-03-24 DIAGNOSIS — D649 Anemia, unspecified: Secondary | ICD-10-CM | POA: Diagnosis not present

## 2022-03-24 DIAGNOSIS — R42 Dizziness and giddiness: Secondary | ICD-10-CM | POA: Diagnosis not present

## 2022-03-24 DIAGNOSIS — I700 Atherosclerosis of aorta: Secondary | ICD-10-CM | POA: Diagnosis not present

## 2022-03-24 DIAGNOSIS — N183 Chronic kidney disease, stage 3 unspecified: Secondary | ICD-10-CM | POA: Diagnosis not present

## 2022-03-24 DIAGNOSIS — E1151 Type 2 diabetes mellitus with diabetic peripheral angiopathy without gangrene: Secondary | ICD-10-CM | POA: Diagnosis not present

## 2022-03-24 DIAGNOSIS — Z6822 Body mass index (BMI) 22.0-22.9, adult: Secondary | ICD-10-CM | POA: Diagnosis not present

## 2022-03-26 DIAGNOSIS — E1122 Type 2 diabetes mellitus with diabetic chronic kidney disease: Secondary | ICD-10-CM | POA: Diagnosis not present

## 2022-03-26 DIAGNOSIS — R42 Dizziness and giddiness: Secondary | ICD-10-CM | POA: Diagnosis not present

## 2022-03-26 DIAGNOSIS — D631 Anemia in chronic kidney disease: Secondary | ICD-10-CM | POA: Diagnosis not present

## 2022-03-26 DIAGNOSIS — D508 Other iron deficiency anemias: Secondary | ICD-10-CM | POA: Diagnosis not present

## 2022-03-26 DIAGNOSIS — I160 Hypertensive urgency: Secondary | ICD-10-CM | POA: Diagnosis not present

## 2022-03-26 DIAGNOSIS — Z716 Tobacco abuse counseling: Secondary | ICD-10-CM | POA: Diagnosis not present

## 2022-03-26 DIAGNOSIS — G4733 Obstructive sleep apnea (adult) (pediatric): Secondary | ICD-10-CM | POA: Diagnosis not present

## 2022-03-26 DIAGNOSIS — N170 Acute kidney failure with tubular necrosis: Secondary | ICD-10-CM | POA: Diagnosis not present

## 2022-03-26 DIAGNOSIS — N189 Chronic kidney disease, unspecified: Secondary | ICD-10-CM | POA: Diagnosis not present

## 2022-03-26 DIAGNOSIS — I5032 Chronic diastolic (congestive) heart failure: Secondary | ICD-10-CM | POA: Diagnosis not present

## 2022-03-26 DIAGNOSIS — E871 Hypo-osmolality and hyponatremia: Secondary | ICD-10-CM | POA: Diagnosis not present

## 2022-03-26 DIAGNOSIS — R809 Proteinuria, unspecified: Secondary | ICD-10-CM | POA: Diagnosis not present

## 2022-03-28 ENCOUNTER — Other Ambulatory Visit: Payer: Self-pay | Admitting: Nephrology

## 2022-03-28 ENCOUNTER — Other Ambulatory Visit (HOSPITAL_COMMUNITY): Payer: Self-pay | Admitting: Nephrology

## 2022-03-28 DIAGNOSIS — E871 Hypo-osmolality and hyponatremia: Secondary | ICD-10-CM | POA: Diagnosis not present

## 2022-03-28 DIAGNOSIS — E1122 Type 2 diabetes mellitus with diabetic chronic kidney disease: Secondary | ICD-10-CM

## 2022-03-28 DIAGNOSIS — G4733 Obstructive sleep apnea (adult) (pediatric): Secondary | ICD-10-CM | POA: Diagnosis not present

## 2022-03-28 DIAGNOSIS — E1129 Type 2 diabetes mellitus with other diabetic kidney complication: Secondary | ICD-10-CM

## 2022-03-28 DIAGNOSIS — R809 Proteinuria, unspecified: Secondary | ICD-10-CM | POA: Diagnosis not present

## 2022-03-28 DIAGNOSIS — D508 Other iron deficiency anemias: Secondary | ICD-10-CM | POA: Diagnosis not present

## 2022-03-28 DIAGNOSIS — E559 Vitamin D deficiency, unspecified: Secondary | ICD-10-CM | POA: Diagnosis not present

## 2022-03-28 DIAGNOSIS — D631 Anemia in chronic kidney disease: Secondary | ICD-10-CM | POA: Diagnosis not present

## 2022-03-28 DIAGNOSIS — I160 Hypertensive urgency: Secondary | ICD-10-CM | POA: Diagnosis not present

## 2022-03-28 DIAGNOSIS — Z716 Tobacco abuse counseling: Secondary | ICD-10-CM | POA: Diagnosis not present

## 2022-03-28 DIAGNOSIS — I5032 Chronic diastolic (congestive) heart failure: Secondary | ICD-10-CM | POA: Diagnosis not present

## 2022-03-28 DIAGNOSIS — R42 Dizziness and giddiness: Secondary | ICD-10-CM | POA: Diagnosis not present

## 2022-03-28 DIAGNOSIS — N189 Chronic kidney disease, unspecified: Secondary | ICD-10-CM | POA: Diagnosis not present

## 2022-03-31 ENCOUNTER — Other Ambulatory Visit (HOSPITAL_COMMUNITY): Payer: Self-pay | Admitting: Nephrology

## 2022-03-31 DIAGNOSIS — N189 Chronic kidney disease, unspecified: Secondary | ICD-10-CM

## 2022-04-01 ENCOUNTER — Ambulatory Visit (HOSPITAL_COMMUNITY)
Admission: RE | Admit: 2022-04-01 | Discharge: 2022-04-01 | Disposition: A | Discharge status: Home / Self Care | Payer: Medicare Other | Source: Ambulatory Visit | Attending: Nephrology | Admitting: Nephrology

## 2022-04-01 DIAGNOSIS — E1129 Type 2 diabetes mellitus with other diabetic kidney complication: Secondary | ICD-10-CM | POA: Diagnosis not present

## 2022-04-01 DIAGNOSIS — N189 Chronic kidney disease, unspecified: Secondary | ICD-10-CM | POA: Insufficient documentation

## 2022-04-01 DIAGNOSIS — R809 Proteinuria, unspecified: Secondary | ICD-10-CM | POA: Insufficient documentation

## 2022-04-01 DIAGNOSIS — E1122 Type 2 diabetes mellitus with diabetic chronic kidney disease: Secondary | ICD-10-CM | POA: Diagnosis not present

## 2022-04-01 DIAGNOSIS — D631 Anemia in chronic kidney disease: Secondary | ICD-10-CM | POA: Diagnosis not present

## 2022-04-05 DIAGNOSIS — I6381 Other cerebral infarction due to occlusion or stenosis of small artery: Secondary | ICD-10-CM | POA: Diagnosis not present

## 2022-04-05 DIAGNOSIS — R2681 Unsteadiness on feet: Secondary | ICD-10-CM | POA: Diagnosis not present

## 2022-04-05 DIAGNOSIS — R42 Dizziness and giddiness: Secondary | ICD-10-CM | POA: Diagnosis not present

## 2022-04-07 ENCOUNTER — Ambulatory Visit (HOSPITAL_COMMUNITY)
Admission: RE | Admit: 2022-04-07 | Discharge: 2022-04-07 | Disposition: A | Discharge status: Home / Self Care | Payer: Medicare Other | Source: Ambulatory Visit | Attending: Cardiology | Admitting: Cardiology

## 2022-04-07 DIAGNOSIS — N1832 Chronic kidney disease, stage 3b: Secondary | ICD-10-CM | POA: Diagnosis not present

## 2022-04-07 DIAGNOSIS — N189 Chronic kidney disease, unspecified: Secondary | ICD-10-CM | POA: Insufficient documentation

## 2022-05-05 DIAGNOSIS — E1122 Type 2 diabetes mellitus with diabetic chronic kidney disease: Secondary | ICD-10-CM | POA: Diagnosis not present

## 2022-05-05 DIAGNOSIS — R6889 Other general symptoms and signs: Secondary | ICD-10-CM | POA: Diagnosis not present

## 2022-05-05 DIAGNOSIS — R808 Other proteinuria: Secondary | ICD-10-CM | POA: Diagnosis not present

## 2022-05-05 DIAGNOSIS — N19 Unspecified kidney failure: Secondary | ICD-10-CM | POA: Diagnosis not present

## 2022-05-05 DIAGNOSIS — N280 Ischemia and infarction of kidney: Secondary | ICD-10-CM | POA: Diagnosis not present

## 2022-05-05 DIAGNOSIS — R768 Other specified abnormal immunological findings in serum: Secondary | ICD-10-CM | POA: Diagnosis not present

## 2022-05-05 DIAGNOSIS — I5032 Chronic diastolic (congestive) heart failure: Secondary | ICD-10-CM | POA: Diagnosis not present

## 2022-05-05 DIAGNOSIS — N189 Chronic kidney disease, unspecified: Secondary | ICD-10-CM | POA: Diagnosis not present

## 2022-05-05 DIAGNOSIS — R2689 Other abnormalities of gait and mobility: Secondary | ICD-10-CM | POA: Diagnosis not present

## 2022-05-05 DIAGNOSIS — N170 Acute kidney failure with tubular necrosis: Secondary | ICD-10-CM | POA: Diagnosis not present

## 2022-05-11 ENCOUNTER — Other Ambulatory Visit (HOSPITAL_COMMUNITY): Payer: Self-pay | Admitting: Nephrology

## 2022-05-11 ENCOUNTER — Other Ambulatory Visit: Payer: Self-pay | Admitting: Nephrology

## 2022-05-11 DIAGNOSIS — E1122 Type 2 diabetes mellitus with diabetic chronic kidney disease: Secondary | ICD-10-CM

## 2022-05-11 DIAGNOSIS — N170 Acute kidney failure with tubular necrosis: Secondary | ICD-10-CM

## 2022-05-11 DIAGNOSIS — R809 Proteinuria, unspecified: Secondary | ICD-10-CM

## 2022-05-11 DIAGNOSIS — I5032 Chronic diastolic (congestive) heart failure: Secondary | ICD-10-CM

## 2022-05-23 NOTE — Progress Notes (Unsigned)
Waynetta Sandy, MD  Donita Brooks D 7 days will be long enough        Previous Messages    ----- Message -----  From: Donita Brooks D  Sent: 05/23/2022  10:32 AM EDT  To: Waynetta Sandy, MD  Subject: RE: PLETAL                                     Please advise how long he needs to HOLD blood thinners??      Thanks  Kenney Houseman   ----- Message -----  From: Waynetta Sandy, MD  Sent: 05/23/2022   8:34 AM EDT  To: Riley Lam  Subject: RE: Karie Mainland   ----- Message -----  From: Donita Brooks D  Sent: 05/20/2022  11:55 AM EDT  To: Waynetta Sandy, MD  Subject: PLETAL                                         Please advise if patient can HOLD PLETAL for US kidney BX??   Thanks  [employer]

## 2022-06-01 ENCOUNTER — Other Ambulatory Visit: Payer: Self-pay | Admitting: Vascular Surgery

## 2022-06-01 ENCOUNTER — Ambulatory Visit (INDEPENDENT_AMBULATORY_CARE_PROVIDER_SITE_OTHER): Payer: Medicare Other | Admitting: Neurology

## 2022-06-01 ENCOUNTER — Encounter: Payer: Self-pay | Admitting: Neurology

## 2022-06-01 VITALS — BP 164/71 | HR 79

## 2022-06-01 DIAGNOSIS — Z981 Arthrodesis status: Secondary | ICD-10-CM

## 2022-06-01 DIAGNOSIS — R269 Unspecified abnormalities of gait and mobility: Secondary | ICD-10-CM | POA: Diagnosis not present

## 2022-06-01 DIAGNOSIS — I779 Disorder of arteries and arterioles, unspecified: Secondary | ICD-10-CM

## 2022-06-01 DIAGNOSIS — G629 Polyneuropathy, unspecified: Secondary | ICD-10-CM | POA: Diagnosis not present

## 2022-06-01 DIAGNOSIS — R42 Dizziness and giddiness: Secondary | ICD-10-CM

## 2022-06-01 DIAGNOSIS — I951 Orthostatic hypotension: Secondary | ICD-10-CM

## 2022-06-01 DIAGNOSIS — G959 Disease of spinal cord, unspecified: Secondary | ICD-10-CM

## 2022-06-01 NOTE — Progress Notes (Addendum)
[facility]  PATIENT: Raymond [name] DOB: May 29, 1961  REQUESTING CLINICIAN: Curlene Labrum, MD HISTORY FROM: Patient and spouse  REASON FOR VISIT: Ongoing dizziness    HISTORICAL  CHIEF COMPLAINT:  Chief Complaint  Patient presents with   New Patient (Initial Visit)    Rm 17 with wife. Reports he is here discuss worsening dizziness/gait instability. Pt reports sx have been present since July and have worsening over the last few months     HISTORY OF PRESENT ILLNESS:  This is a 61 year old gentleman with multiple medical conditions including hypertension, hyperlipidemia, diabetes type 2, CKD, CAD, history of cervical spondylosis with myelopathy status post fusion in 2000 who is presenting with ongoing dizziness.  Patient reported dizziness started on July and since then has been getting worse.  Dizziness described as swimmy headed.  He reported his the episodes happens when he stands up and start walking.  When sitting down or when lying down he is not experiencing any symptoms.  He has tried meclizine without any help.  He had 2 falls due to the dizziness.  He has a walker at home.  He said last year in December he had a right carotid stent placed.      164/71  OTHER MEDICAL CONDITIONS: Cervical spondylosis with myelopathy s/p fusion, Hypertension, Hyperlipidemia, DMII CAD, CKD, COPD,    REVIEW OF SYSTEMS: Full 14 system review of systems performed and negative with exception of: As noted in the HPI   ALLERGIES: No Known Allergies  HOME MEDICATIONS: Outpatient Medications Prior to Visit  Medication Sig Dispense Refill   acetaminophen (TYLENOL) 500 MG tablet Take 1,000 mg by mouth every 6 (six) hours as needed for mild pain.     amLODipine (NORVASC) 10 MG tablet Take 10 mg by mouth daily.     aspirin 81 MG tablet Take 81 mg by mouth daily.     cilostazol (PLETAL) 100 MG tablet TAKE 1 TABLET 2 TIMES A DAY BEFORE A MEAL. 180 tablet 0   diphenhydrAMINE  (BENADRYL) 25 MG tablet Take 25 mg by mouth 2 (two) times daily as needed for allergies.     doxazosin (CARDURA) 4 MG tablet Take 1 tablet (4 mg total) by mouth daily. 30 tablet 3   famotidine (PEPCID) 20 MG tablet Take 20 mg by mouth daily.     ferrous sulfate 325 (65 FE) MG EC tablet Take 1 tablet (325 mg total) by mouth daily with breakfast. 30 tablet 0   fluticasone (FLONASE) 50 MCG/ACT nasal spray Place 2 sprays into both nostrils daily.     lisinopril (PRINIVIL,ZESTRIL) 40 MG tablet Take 40 mg by mouth daily.     metoprolol succinate (TOPROL-XL) 50 MG 24 hr tablet Take 50 mg by mouth daily.     polyvinyl alcohol (LIQUIFILM TEARS) 1.4 % ophthalmic solution 1 drop as needed for dry eyes.     rosuvastatin (CRESTOR) 20 MG tablet Take 2 tablets (40 mg total) by mouth daily.     SPIRIVA HANDIHALER 18 MCG inhalation capsule Place 18 mcg into inhaler and inhale daily.     traZODone (DESYREL) 100 MG tablet Take 100 mg by mouth at bedtime.     clopidogrel (PLAVIX) 75 MG tablet Take 75 mg by mouth. (Patient not taking: Reported on 09/09/2021)     metFORMIN (GLUCOPHAGE) 1000 MG tablet Take 0.5 tablets (500 mg total) by mouth 2 (two) times daily with a meal. (Patient not taking: Reported on 09/09/2021)     Multiple  Vitamin (MULTIVITAMIN WITH MINERALS) TABS tablet Take 1 tablet by mouth daily.     oxyCODONE-acetaminophen (PERCOCET) 5-325 MG tablet Take 1 tablet by mouth every 6 (six) hours as needed for severe pain. (Patient not taking: Reported on 06/01/2022) 8 tablet 0   pantoprazole (PROTONIX) 20 MG tablet Take 1 tablet (20 mg total) by mouth daily. 90 tablet 1   No facility-administered medications prior to visit.    PAST MEDICAL HISTORY: Past Medical History:  Diagnosis Date   Cancer ([place])    melanoma   Carotid artery occlusion    Cervical spondylosis with myelopathy    Arthritis   CKD (chronic kidney disease)    stage III (07/2021)   COPD (chronic obstructive pulmonary disease) ([place]) 2022    Diabetes mellitus    Type II   Hyperlipidemia    Hypertension    Impotence of organic origin    Insomnia    Peripheral arterial disease ([place])    Restless leg syndrome    Sleep apnea     PAST SURGICAL HISTORY: Past Surgical History:  Procedure Laterality Date   APPENDECTOMY     years ago   EYE SURGERY  2017   cataracts   spinal fusion  09/12/1998   SPINE SURGERY  09/12/1998   Cervical Myelopathy- S/P fusion   TRANSCAROTID ARTERY REVASCULARIZATION  Right 08/13/2021   Procedure: RIGHT TRANSCAROTID ARTERY REVASCULARIZATION;  Surgeon: Waynetta Sandy, MD;  Location: [facility];  Service: Vascular;  Laterality: Right;   ULTRASOUND GUIDANCE FOR VASCULAR ACCESS Left 08/13/2021   Procedure: ULTRASOUND GUIDANCE FOR VASCULAR ACCESS;  Surgeon: Waynetta Sandy, MD;  Location: [facility] OR;  Service: Vascular;  Laterality: Left;    FAMILY HISTORY: Family History  Problem Relation Age of Onset   Cancer Mother    Hypertension Father    Hypertension Sister     SOCIAL HISTORY: Social History   Socioeconomic History   Marital status: Married    Spouse name: Not on file   Number of children: Not on file   Years of education: Not on file   Highest education level: Not on file  Occupational History   Not on file  Tobacco Use   Smoking status: Every Day    Packs/day: 1.00    Types: Cigarettes   Smokeless tobacco: Never   Tobacco comments:    smokes about 3 cigs in last month  Vaping Use   Vaping Use: Never used  Substance and Sexual Activity   Alcohol use: Not Currently   Drug use: No   Sexual activity: Not on file  Other Topics Concern   Not on file  Social History Narrative   Not on file   Social Determinants of Health   Financial Resource Strain: Not on file  Food Insecurity: Not on file  Transportation Needs: Not on file  Physical Activity: Not on file  Stress: Not on file  Social Connections: Not on file  Intimate Partner Violence: Not on file     PHYSICAL EXAM  GENERAL EXAM/CONSTITUTIONAL: Vitals:  Vitals:   06/01/22 1044 06/01/22 1146  BP: (!) 182/77 (!) 164/71  Pulse: 78 79   There is no height or weight on file to calculate BMI. Wt Readings from Last 3 Encounters:  09/09/21 162 lb (73.5 kg)  08/26/21 167 lb 8.8 oz (76 kg)  08/23/21 168 lb (76.2 kg)   Patient is in no distress; well developed, nourished and groomed; neck is supple  EYES: Pupils round and reactive to light,  Visual fields full to confrontation, Extraocular movements intacts,   MUSCULOSKELETAL: Gait, strength, tone, movements noted in Neurologic exam below  NEUROLOGIC: MENTAL STATUS:      No data to display         awake, alert, oriented to person, place and time recent and remote memory intact normal attention and concentration language fluent, comprehension intact, naming intact fund of knowledge appropriate  CRANIAL NERVE:  2nd, 3rd, 4th, 6th - pupils equal and reactive to light, visual fields full to confrontation, extraocular muscles intact, no nystagmus 5th - facial sensation symmetric 7th - facial strength symmetric 8th - hearing intact 9th - palate elevates symmetrically, uvula midline 11th - shoulder shrug symmetric 12th - tongue protrusion midline  MOTOR:  normal bulk and tone, he has weakness in the bilateral hip flexors 4/5  SENSORY:  Decrease vibratory sense up to midshin  COORDINATION:  finger-nose-finger  REFLEXES:  deep tendon reflexes present and symmetric  GAIT/STATION:  Able to stand unassisted but wobbly, Unable to walk without assistance. He has a positive romberg.     DIAGNOSTIC DATA (LABS, IMAGING, TESTING) - I reviewed patient records, labs, notes, testing and imaging myself where available.  Lab Results  Component Value Date   WBC 8.5 09/09/2021   HGB 8.6 Repeated and verified X2. (L) 09/09/2021   HCT 25.4 Repeated and verified X2. (L) 09/09/2021   MCV 83.1 09/09/2021   PLT 308.0 09/09/2021       Component Value Date/Time   NA 131 (L) 09/09/2021 1121   K 4.2 09/09/2021 1121   CL 102 09/09/2021 1121   CO2 25 09/09/2021 1121   GLUCOSE 192 (H) 09/09/2021 1121   BUN 16 09/09/2021 1121   CREATININE 1.81 (H) 09/09/2021 1121   CALCIUM 9.0 09/09/2021 1121   PROT 5.9 (L) 09/09/2021 1121   ALBUMIN 3.3 (L) 09/09/2021 1121   AST 11 09/09/2021 1121   ALT 15 09/09/2021 1121   ALKPHOS 79 09/09/2021 1121   BILITOT 0.3 09/09/2021 1121   GFRNONAA 40 (L) 08/26/2021 1608   Lab Results  Component Value Date   CHOL 92 08/14/2021   HDL 33 (L) 08/14/2021   LDLCALC 41 08/14/2021   TRIG 88 08/14/2021   CHOLHDL 2.8 08/14/2021   Lab Results  Component Value Date   HGBA1C 7.0 (H) 07/07/2021   No results found for: "VITAMINB12" No results found for: "TSH"   MRI Brain without contrast 04/05/22 No acute intracranial process. Redemonstrated lacunar infarcts in the bilateral basal ganglia and left thalamus.  ASSESSMENT AND PLAN  61 y.o. year old male with multiple medical conditions including hypertension, hyperlipidemia, diabetes, peripheral neuropathy, history of cervical myelopathy who is presenting with dizziness since June and getting worse.  He has tried meclizine without clear benefit.  On exam today he was noted to have a drop in blood pressure from 742 systolic to 595 upon standing.  He does have weakness in the lower extremities and evidence of peripheral neuropathy.  I told patient that I suspect that his dizziness is multifactorial including probable orthostatic hypotension, peripheral neuropathy, and possible cervical myelopathy and deconditioning.  He does report increased neck pain and decreased range of motion.  I will refer him back to his primary primary care doctor for further management of his blood pressure, I will also obtain a MRI brain and MRI cervical spine for further studies.  I will send him for physical therapy for gait training.  He is comfortable with plan.  I will  see  him in 6 months for follow-up.   1. Dizziness   2. Abnormal gait   3. Orthostatic hypotension   4. Hx of fusion of cervical spine   5. Peripheral polyneuropathy      Patient Instructions  Continue currents medications  Follow up with primary care physician regarding blood pressure management. I suspect orthostatic hypotension  Referral to physical therapy for gait training  MRI Cervical spine  Follow up in 6 months or sooner if worse   Orders Placed This Encounter  Procedures   MR CERVICAL SPINE [place]   Ambulatory referral to Physical Therapy    No orders of the defined types were placed in this encounter.   Return in about 6 months (around 11/30/2022).    Alric Ran, MD 06/01/2022, 4:21 PM  [facility] [address] [place], [place] [address] [phone]

## 2022-06-01 NOTE — Patient Instructions (Addendum)
Continue currents medications  Follow up with primary care physician regarding blood pressure management. I suspect orthostatic hypotension  Referral to physical therapy for gait training  MRI Cervical spine  Follow up in 6 months or sooner if worse

## 2022-06-01 NOTE — Addendum Note (Signed)
Addended byAlric Ran on: 06/01/2022 04:22 PM   Modules accepted: Orders

## 2022-06-02 ENCOUNTER — Telehealth: Payer: Self-pay | Admitting: Neurology

## 2022-06-02 NOTE — Telephone Encounter (Signed)
medicare NPR sent to [facility]

## 2022-06-06 ENCOUNTER — Other Ambulatory Visit: Payer: Self-pay | Admitting: Radiology

## 2022-06-06 DIAGNOSIS — Z01812 Encounter for preprocedural laboratory examination: Secondary | ICD-10-CM

## 2022-06-07 ENCOUNTER — Other Ambulatory Visit: Payer: Self-pay | Admitting: Radiology

## 2022-06-08 ENCOUNTER — Ambulatory Visit (HOSPITAL_COMMUNITY)
Admission: RE | Admit: 2022-06-08 | Discharge: 2022-06-08 | Disposition: A | Discharge status: Home / Self Care | Payer: Medicare Other | Source: Ambulatory Visit | Attending: Nephrology | Admitting: Nephrology

## 2022-06-08 ENCOUNTER — Encounter (HOSPITAL_COMMUNITY): Payer: Self-pay

## 2022-06-09 DIAGNOSIS — N170 Acute kidney failure with tubular necrosis: Secondary | ICD-10-CM | POA: Diagnosis not present

## 2022-06-09 DIAGNOSIS — N189 Chronic kidney disease, unspecified: Secondary | ICD-10-CM | POA: Diagnosis not present

## 2022-06-09 DIAGNOSIS — I5032 Chronic diastolic (congestive) heart failure: Secondary | ICD-10-CM | POA: Diagnosis not present

## 2022-06-09 DIAGNOSIS — E1122 Type 2 diabetes mellitus with diabetic chronic kidney disease: Secondary | ICD-10-CM | POA: Diagnosis not present

## 2022-06-09 DIAGNOSIS — N19 Unspecified kidney failure: Secondary | ICD-10-CM | POA: Diagnosis not present

## 2022-06-09 DIAGNOSIS — R6889 Other general symptoms and signs: Secondary | ICD-10-CM | POA: Diagnosis not present

## 2022-06-09 DIAGNOSIS — N280 Ischemia and infarction of kidney: Secondary | ICD-10-CM | POA: Diagnosis not present

## 2022-06-09 DIAGNOSIS — R768 Other specified abnormal immunological findings in serum: Secondary | ICD-10-CM | POA: Diagnosis not present

## 2022-06-09 DIAGNOSIS — R808 Other proteinuria: Secondary | ICD-10-CM | POA: Diagnosis not present

## 2022-06-16 DIAGNOSIS — D638 Anemia in other chronic diseases classified elsewhere: Secondary | ICD-10-CM | POA: Diagnosis not present

## 2022-06-16 DIAGNOSIS — R808 Other proteinuria: Secondary | ICD-10-CM | POA: Diagnosis not present

## 2022-06-16 DIAGNOSIS — N170 Acute kidney failure with tubular necrosis: Secondary | ICD-10-CM | POA: Diagnosis not present

## 2022-06-16 DIAGNOSIS — E1122 Type 2 diabetes mellitus with diabetic chronic kidney disease: Secondary | ICD-10-CM | POA: Diagnosis not present

## 2022-06-16 DIAGNOSIS — N280 Ischemia and infarction of kidney: Secondary | ICD-10-CM | POA: Diagnosis not present

## 2022-06-16 DIAGNOSIS — I5032 Chronic diastolic (congestive) heart failure: Secondary | ICD-10-CM | POA: Diagnosis not present

## 2022-06-16 DIAGNOSIS — N19 Unspecified kidney failure: Secondary | ICD-10-CM | POA: Diagnosis not present

## 2022-06-16 DIAGNOSIS — R600 Localized edema: Secondary | ICD-10-CM | POA: Diagnosis not present

## 2022-06-16 DIAGNOSIS — N189 Chronic kidney disease, unspecified: Secondary | ICD-10-CM | POA: Diagnosis not present

## 2022-06-24 ENCOUNTER — Ambulatory Visit (HOSPITAL_COMMUNITY)
Admission: RE | Admit: 2022-06-24 | Discharge: 2022-06-24 | Disposition: A | Discharge status: Home / Self Care | Payer: Medicare Other | Source: Ambulatory Visit | Attending: Neurology | Admitting: Neurology

## 2022-06-24 DIAGNOSIS — Z09 Encounter for follow-up examination after completed treatment for conditions other than malignant neoplasm: Secondary | ICD-10-CM | POA: Insufficient documentation

## 2022-06-24 DIAGNOSIS — M4802 Spinal stenosis, cervical region: Secondary | ICD-10-CM | POA: Diagnosis not present

## 2022-06-24 DIAGNOSIS — Z981 Arthrodesis status: Secondary | ICD-10-CM | POA: Insufficient documentation

## 2022-06-24 DIAGNOSIS — M5412 Radiculopathy, cervical region: Secondary | ICD-10-CM | POA: Diagnosis not present

## 2022-06-27 NOTE — Addendum Note (Signed)
Addended byAlric Ran on: 06/27/2022 04:47 PM   Modules accepted: Orders

## 2022-06-28 ENCOUNTER — Telehealth: Payer: Self-pay | Admitting: Neurology

## 2022-06-28 NOTE — Telephone Encounter (Signed)
Referral faxed to [facility], phone # [phone].

## 2022-06-29 ENCOUNTER — Ambulatory Visit (HOSPITAL_COMMUNITY): Payer: Medicare Other | Admitting: Physical Therapy

## 2022-07-04 NOTE — Telephone Encounter (Signed)
Appointment 07/07/22 at 11am with Ashok Pall, MD.

## 2022-07-07 DIAGNOSIS — M4802 Spinal stenosis, cervical region: Secondary | ICD-10-CM | POA: Diagnosis not present

## 2022-07-07 DIAGNOSIS — G992 Myelopathy in diseases classified elsewhere: Secondary | ICD-10-CM | POA: Diagnosis not present

## 2022-07-12 ENCOUNTER — Encounter (HOSPITAL_COMMUNITY): Payer: Self-pay | Admitting: Neurosurgery

## 2022-07-12 ENCOUNTER — Other Ambulatory Visit: Payer: Self-pay

## 2022-07-12 NOTE — Progress Notes (Signed)
Case: [id number] Date/Time: 07/13/22 1050   Procedure: C3-4 ACDF w/Plate removal - 3C/RM 21 BEFORE DR CABBELLS OTHER CASE IN RM 21   Anesthesia type: General   Pre-op diagnosis: Myelopathy concurrent with and due to spinal stenosis of cervical region   Location: [facility] OR ROOM 21 / [place] [facility]   Surgeons: Ashok Pall, MD      Anesthesia Chart Review: SAME DAY WORK-UP  DISCUSSION: Patient is a 61 year old [name] scheduled for the above procedure. Case is a late add-on. Recently referred to neurosurgery by neurology after MRI C-spine showed severe spinal canal stenosis and bilateral neural foraminal stenosis at C3-4 secondary to combination of disc bulge and severe facet arthrosis, possible mild myelomalacia signal change. He had been having ongoing dizziness with recurrent falls, weakness in hie LE extremities, increased neck pain with decreased ROM.    History includes smoking, HTN, HLD, DM2, PAD, carotid artery disease (s/p right TCAR 08/13/21), CKD (Stage IV), OSA, COPD, RLS, skin cancer (melanoma), spinal surgery  (s/p cervical fusion 2000), COVID-19 (09/2020).   He had cardiology evaluation in 2022 prior to undergoing right TCAR. He saw Dr. Margaretann [name] on 07/08/21 for a preoperative evaluation. Echo that day showed LVEF 50-55% with mild anterior hypokinesis. He subsequently had a stress test on 07/13/21 that was non-ischemic, but Dr. Margaretann [name] considered "intermediate risk"--no ischemia on stress images but probable small area of anterior wall infarct at the apex and mid ventricle consistent with his wall motion abnormality. She recommended HTN optimization, and added, "The patient is overall high risk for intermediate risk procedure. Factors contributing to risk include uncontrolled hypertension, baseline renal dysfunction, current smoking, poor functional capacity, and probable prior anterior infarct. However, no ischemia on stress test, therefore no further cardiovascular testing is required prior to the  procedure..." He has not been seen by cardiology since his stress test. He did undergo right TCAR on 08/13/21.   He last saw nephrologist Dr. Theador Hawthorne on 06/16/22. Labs from 06/09/22 [facility] Everywhere) showed TSH 2.53, Mg 2.5, H/H 12.6/37.6, PLT 174, WBC 8.8, glucose 180, BUN 29, Cr 3.22 (previously 2.80 on 03/28/22, 2.84 01/16/22), eGFR 21, Na 135, K 4.2, albumin 3.6, phosphorus 4.8. A1c 7.1% on 03/28/22. Given increase in Creatinine, Dr. Theador Hawthorne discontinued RAAS blockers/lisinopril. He was started on chlorthalidone. Future renal biopsy planned for proteinuria. One month follow-up planned.   A1c 7.1% on 03/28/22. He is on Fargixa 5 mg daily.   He is a same day work-up, so he will need labs and EKG updated on arrival. Anesthesia team to evaluate on the day of surgery and definitve plan at that time. He is on Pletal and ASA--perioperative instructions per Dr. Christella Noa.    VS:  BP Readings from Last 3 Encounters:  09/Raymond/23 (!) 164/71  09/09/21 (!) 170/64  08/26/21 (!) 191/85   Pulse Readings from Last 3 Encounters:  09/Raymond/23 79  09/09/21 99  08/26/21 (!) 108     PROVIDERS: Burdine, Virgina Evener, MD is PCP ([facility]) Cherlynn Kaiser, MD is cardiologist. Also saw Tommy Medal, [place] on 08/09/21  in the HTN Clinic. Ulice Bold, MD is nephrologist. Last visit 06/16/22 for routine follow-up CKD stage 4. Raymond Snare, MD is vascular surgeon Dayna Barker, MD is GI Alric Ran, MD is neurologist   LABS: See DISCUSSION. Labs on arrival.    IMAGES: MRI C-spine 06/24/22: IMPRESSION: 1. Severe spinal canal stenosis and bilateral neural foraminal stenosis at C3-4 secondary to combination of disc bulge and severe facet arthrosis. Possible mild  myelomalacia signal change 2. Severe right C2-3 neural foraminal stenosis. 3. Mild spinal canal stenosis and bilateral neural foraminal stenosis at C6-7. 4. C4-6 ACDF with solid fusion.   US Renal  04/07/22: Summary:  Largest Aortic Diameter: 2.5 cm  Renal:  - Right: Normal size right kidney. Abnormal right Resistive Index. Normal cortical thickness of right kidney. RRV flow present. 1-59% stenosis of the right renal artery.  - Left:  Normal size of left kidney. Abnormal left Resisitve Index. Normal cortical thickness of the left kidney. LRV flow present. 1-59% stenosis of the left renal artery.  Mesenteric:  Normal Celiac artery and Superior Mesenteric artery findings.   MRI Brain 04/05/22 [facility]): IMPRESSION: No acute intracranial process. 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    CXR 01/16/22 [facility]): FINDINGS:  The heart size and mediastinal contours are within normal limits.  Both lungs are clear. The visualized skeletal structures are  unremarkable. IMPRESSION: No active cardiopulmonary disease.    CV: Last EKG is > 57 year old.   EKG 07/07/21: Normal sinus rhythm Anterior infarct , age undetermined Abnormal ECG Since last tracing poor R wave progression is new Confirmed by Oswaldo Milian [phone]) on 07/07/2021 8:55:35 PM     CV: US Carotid 09/16/21: Summary:  - Right Carotid: Patent stent with no significant stenosis. Flow appears turbulent with no apparent obstruction.  - Left Carotid: Velocities in the left ICA are consistent with a 1-39% stenosis. The ECA appears >50% stenosed by velocity. No apparent obstruction.  - Vertebrals:  Bilateral vertebral arteries demonstrate antegrade flow.  - Subclavians: Normal flow hemodynamics were seen in bilateral subclavian arteries.   Nuclear stress test 07/13/21: Findings: Negative for stress induced arrhythmias, baseline anterior infarct pattern on ECG. There is an true apical perfusion defect in rest and stress without wall motion abnormality more consistent with apical thinning.   This is less consistent with infarct.   Conclusions: Stress test is negative for  ischemia.     Findings are consistent with ischemia. The study is low risk.   No ST deviation was noted.   Left ventricular function is normal. Nuclear stress EF: 61 %. The left ventricular ejection fraction is normal (55-65%). End diastolic cavity size is normal.   Prior study not available for comparison. Reviewed by Dr. Cline Crock wrote, "No ischemia on stress test images. Probable small area of anterior wall infarct at the apex and mid ventricle which is consistent with known wall motion abnormality on echo and ECG. Stress test is intermediate risk. I have independently reviewed the images. I would start the patient on doxazosin 4 mg daily and refer to CVRR HTN clinic for further optimization in 2 weeks.   The patient is overall high risk for intermediate risk procedure. Factors contributing to risk include uncontrolled hypertension, baseline renal dysfunction, current smoking, poor functional capacity, and probable prior anterior infarct. However, no ischemia on stress test, therefore no further cardiovascular testing is required prior to the procedure. We will attempt to optimize blood pressure in the perioperative period, however suspect risk level is largely non-modifiable prior to surgery. Risk level should be discussed between patient and surgical team prior to proceeding."     Echo 07/08/21: IMPRESSIONS   1. Left ventricular ejection fraction, by estimation, is 50 to 55%. The left ventricle has low normal function. The left ventricle demonstrates regional wall motion abnormalities (see scoring diagram/findings for description). There is mild concentric left ventricular hypertrophy. Left ventricular  diastolic parameters were normal. There is mild hypokinesis of the left ventricular, basal-mid anterior wall.   2. Right ventricular systolic function is normal. The right ventricular size is normal.   3. The mitral valve is normal in structure. Trivial mitral valve regurgitation. No evidence  of mitral stenosis.   4. The aortic valve is tricuspid. Aortic valve regurgitation is not visualized. No aortic stenosis is present.   5. The inferior vena cava is normal in size with greater than 50% respiratory variability, suggesting right atrial pressure of 3 mmHg.  - Comparison(s): Prior images unable to be directly viewed, comparison made by report only. Changes from prior study are noted.  - Conclusion(s)/Recommendation(s): Low normal EF, but there is a focal wall motion abnormalities in the basal to mid anterior wall. No prior images available but this is not noted on prior report.       Past Medical History:  Diagnosis Date   Cancer [facility])    melanoma   Carotid artery occlusion    Cervical spondylosis with myelopathy    Arthritis   CKD (chronic kidney disease)    stage III (07/2021)   COPD (chronic obstructive pulmonary disease) ([place]) 2022   Diabetes mellitus    Type II   Hyperlipidemia    Hypertension    Impotence of organic origin    Insomnia    Peripheral arterial disease ([place])    Restless leg syndrome    Sleep apnea     Past Surgical History:  Procedure Laterality Date   APPENDECTOMY     years ago   EYE SURGERY  2017   cataracts   spinal fusion  09/12/1998   SPINE SURGERY  09/12/1998   Cervical Myelopathy- S/P fusion   TRANSCAROTID ARTERY REVASCULARIZATION  Right 08/13/2021   Procedure: RIGHT TRANSCAROTID ARTERY REVASCULARIZATION;  Surgeon: Waynetta Sandy, MD;  Location: [place];  Service: Vascular;  Laterality: Right;   ULTRASOUND GUIDANCE FOR VASCULAR ACCESS Left 08/13/2021   Procedure: ULTRASOUND GUIDANCE FOR VASCULAR ACCESS;  Surgeon: Waynetta Sandy, MD;  Location: [place];  Service: Vascular;  Laterality: Left;    MEDICATIONS: No current facility-administered medications for this encounter.    acetaminophen (TYLENOL) 325 MG tablet   amLODipine (NORVASC) 10 MG tablet   aspirin 81 MG tablet   chlorthalidone (HYGROTON) 25 MG tablet    cilostazol (PLETAL) 100 MG tablet   doxazosin (CARDURA) 4 MG tablet   famotidine (PEPCID) Raymond MG tablet   FARXIGA 5 MG TABS tablet   ferrous sulfate 325 (65 FE) MG EC tablet   fluticasone (FLONASE) 50 MCG/ACT nasal spray   metoprolol succinate (TOPROL-XL) 50 MG 24 hr tablet   polyvinyl alcohol (LIQUIFILM TEARS) 1.4 % ophthalmic solution   Potassium 99 MG TABS   rosuvastatin (CRESTOR) Raymond MG tablet   SPIRIVA HANDIHALER 18 MCG inhalation capsule   traZODone (DESYREL) 100 MG tablet    Myra Gianotti, PA-C Surgical Short Stay/Anesthesiology [facility] Phone [phone] [facility] Phone [phone] 07/12/2022 5:57 PM

## 2022-07-12 NOTE — Anesthesia Preprocedure Evaluation (Addendum)
Anesthesia Evaluation  Patient identified by MRN, date of birth, ID band Patient awake    Reviewed: Allergy & Precautions, NPO status , Patient's Chart, lab work & pertinent test results  Airway Mallampati: II  TM Distance: >3 FB Neck ROM: Limited    Dental no notable dental hx.    Pulmonary sleep apnea , COPD, Current Smoker and Patient abstained from smoking.,    Pulmonary exam normal        Cardiovascular hypertension, Pt. on medications and Pt. on home beta blockers + Peripheral Vascular Disease   Rhythm:Regular Rate:Normal     Neuro/Psych negative neurological ROS  negative psych ROS   GI/Hepatic negative GI ROS, Neg liver ROS,   Endo/Other  diabetes  Renal/GU CRFRenal disease  negative genitourinary   Musculoskeletal  (+) Arthritis , Osteoarthritis,  Cervical myelopathy    Abdominal Normal abdominal exam  (+)   Peds  Hematology  (+) Blood dyscrasia, anemia ,   Anesthesia Other Findings   Reproductive/Obstetrics                            Anesthesia Physical Anesthesia Plan  ASA: 3  Anesthesia Plan: General   Post-op Pain Management: Celebrex PO (pre-op)* and Tylenol PO (pre-op)*   Induction: Intravenous  PONV Risk Score and Plan: 1 and Ondansetron, Dexamethasone, Midazolam and Treatment may vary due to age or medical condition  Airway Management Planned: Mask and Oral ETT  Additional Equipment: None  Intra-op Plan:   Post-operative Plan: Extubation in OR  Informed Consent: I have reviewed the patients History and Physical, chart, labs and discussed the procedure including the risks, benefits and alternatives for the proposed anesthesia with the patient or authorized representative who has indicated his/her understanding and acceptance.     Dental advisory given  Plan Discussed with: CRNA  Anesthesia Plan Comments: (PAT note written 07/12/2022 by Myra Gianotti,  PA-C. )       Anesthesia Quick Evaluation

## 2022-07-12 NOTE — Progress Notes (Signed)
Mr. Raymond [name] denies chest pain or shortness of breath. Patient denies having any s/s of Covid in his household, also denies any known exposure to Covid.   Mr. Raymond [name] 's PCP is Dr. Judd Lien at [facility]. Mr. Raymond [name] has seen cardiologist, but is not assigned to a certain.   Mr. Raymond [name] was not instructed to hold [place]. Mr. Raymond [name] was instructed to hold Pletal- last dose was 07/08/22.  Mr. Raymond [name] asked me to complete the call with his wife. [name] reports that Mr. Raymond [name] checks CBG once a week, she is not sure how it is running, but knows it has come down since starting [place]. Mrs. Raymond [name] reported that A1C was drawn at PCP's office and that patient had an EKG at PCP's office in the past few months.  I requested labs and EKG tracing.   I instructed patient to check CBG after awaking and every 2 hours until arrival  to the hospital.  I Instructed patient if CBG is less than 70 to take 4 Glucose Tablets or 1 tube of Glucose Gel or 1/2 cup of a clear juice. Recheck CBG in 15 minutes if CBG is not over 70 call, pre- op desk at [phone] for further instructions.

## 2022-07-13 ENCOUNTER — Inpatient Hospital Stay (HOSPITAL_COMMUNITY): Payer: Medicare Other | Admitting: Vascular Surgery

## 2022-07-13 ENCOUNTER — Inpatient Hospital Stay (HOSPITAL_COMMUNITY): Payer: Medicare Other

## 2022-07-13 ENCOUNTER — Other Ambulatory Visit: Payer: Self-pay

## 2022-07-13 ENCOUNTER — Inpatient Hospital Stay (HOSPITAL_COMMUNITY)
Admission: RE | Admit: 2022-07-13 | Discharge: 2022-07-13 | DRG: 472 | Disposition: A | Discharge status: Home / Self Care | Payer: Medicare Other | Attending: Neurosurgery | Admitting: Neurosurgery

## 2022-07-13 ENCOUNTER — Inpatient Hospital Stay (HOSPITAL_COMMUNITY)
Admission: RE | Disposition: A | Discharge status: Home / Self Care | Payer: Self-pay | Source: Home / Self Care | Attending: Neurosurgery

## 2022-07-13 ENCOUNTER — Encounter (HOSPITAL_COMMUNITY): Payer: Self-pay | Admitting: Neurosurgery

## 2022-07-13 DIAGNOSIS — J449 Chronic obstructive pulmonary disease, unspecified: Secondary | ICD-10-CM | POA: Diagnosis present

## 2022-07-13 DIAGNOSIS — F17210 Nicotine dependence, cigarettes, uncomplicated: Secondary | ICD-10-CM

## 2022-07-13 DIAGNOSIS — M4712 Other spondylosis with myelopathy, cervical region: Secondary | ICD-10-CM | POA: Diagnosis present

## 2022-07-13 DIAGNOSIS — R292 Abnormal reflex: Secondary | ICD-10-CM | POA: Diagnosis present

## 2022-07-13 DIAGNOSIS — E1151 Type 2 diabetes mellitus with diabetic peripheral angiopathy without gangrene: Secondary | ICD-10-CM | POA: Diagnosis present

## 2022-07-13 DIAGNOSIS — M2578 Osteophyte, vertebrae: Secondary | ICD-10-CM | POA: Diagnosis present

## 2022-07-13 DIAGNOSIS — D631 Anemia in chronic kidney disease: Secondary | ICD-10-CM | POA: Diagnosis not present

## 2022-07-13 DIAGNOSIS — M4802 Spinal stenosis, cervical region: Secondary | ICD-10-CM

## 2022-07-13 DIAGNOSIS — Z85820 Personal history of malignant melanoma of skin: Secondary | ICD-10-CM

## 2022-07-13 DIAGNOSIS — E1122 Type 2 diabetes mellitus with diabetic chronic kidney disease: Secondary | ICD-10-CM | POA: Diagnosis present

## 2022-07-13 DIAGNOSIS — I129 Hypertensive chronic kidney disease with stage 1 through stage 4 chronic kidney disease, or unspecified chronic kidney disease: Secondary | ICD-10-CM | POA: Diagnosis present

## 2022-07-13 DIAGNOSIS — Z8616 Personal history of COVID-19: Secondary | ICD-10-CM | POA: Diagnosis not present

## 2022-07-13 DIAGNOSIS — E785 Hyperlipidemia, unspecified: Secondary | ICD-10-CM | POA: Diagnosis present

## 2022-07-13 DIAGNOSIS — G2581 Restless legs syndrome: Secondary | ICD-10-CM | POA: Diagnosis present

## 2022-07-13 DIAGNOSIS — N184 Chronic kidney disease, stage 4 (severe): Secondary | ICD-10-CM | POA: Diagnosis present

## 2022-07-13 DIAGNOSIS — G992 Myelopathy in diseases classified elsewhere: Secondary | ICD-10-CM | POA: Diagnosis not present

## 2022-07-13 DIAGNOSIS — Z981 Arthrodesis status: Secondary | ICD-10-CM | POA: Diagnosis not present

## 2022-07-13 DIAGNOSIS — M4322 Fusion of spine, cervical region: Principal | ICD-10-CM | POA: Diagnosis present

## 2022-07-13 DIAGNOSIS — G959 Disease of spinal cord, unspecified: Secondary | ICD-10-CM

## 2022-07-13 DIAGNOSIS — M5001 Cervical disc disorder with myelopathy,  high cervical region: Secondary | ICD-10-CM | POA: Diagnosis present

## 2022-07-13 DIAGNOSIS — F17200 Nicotine dependence, unspecified, uncomplicated: Secondary | ICD-10-CM | POA: Diagnosis not present

## 2022-07-13 DIAGNOSIS — N189 Chronic kidney disease, unspecified: Secondary | ICD-10-CM

## 2022-07-13 HISTORY — PX: ANTERIOR CERVICAL DECOMP/DISCECTOMY FUSION: SHX1161

## 2022-07-13 LAB — BASIC METABOLIC PANEL
Anion gap: 13 (ref 5–15)
BUN: 41 mg/dL — ABNORMAL HIGH (ref 8–23)
CO2: 20 mmol/L — ABNORMAL LOW (ref 22–32)
Calcium: 9.3 mg/dL (ref 8.9–10.3)
Chloride: 96 mmol/L — ABNORMAL LOW (ref 98–111)
Creatinine, Ser: 3.61 mg/dL — ABNORMAL HIGH (ref 0.61–1.24)
GFR, Estimated: 18 mL/min — ABNORMAL LOW (ref >60)
Glucose, Bld: 218 mg/dL — ABNORMAL HIGH (ref 70–99)
Potassium: 3.7 mmol/L (ref 3.5–5.1)
Sodium: 129 mmol/L — ABNORMAL LOW (ref 135–145)

## 2022-07-13 LAB — CBC
HCT: 41.9 % (ref 39.0–52.0)
Hemoglobin: 14.9 g/dL (ref 13.0–17.0)
MCH: 29.7 pg (ref 26.0–34.0)
MCHC: 35.6 g/dL (ref 30.0–36.0)
MCV: 83.6 fL (ref 80.0–100.0)
Platelets: 211 10*3/uL (ref 150–400)
RBC: 5.01 MIL/uL (ref 4.22–5.81)
RDW: 11.5 % (ref 11.5–15.5)
WBC: 11.6 10*3/uL — ABNORMAL HIGH (ref 4.0–10.5)
nRBC: 0 % (ref 0.0–0.2)

## 2022-07-13 LAB — GLUCOSE, CAPILLARY
Glucose-Capillary: 230 mg/dL — ABNORMAL HIGH (ref 70–99)
Glucose-Capillary: 119 mg/dL — ABNORMAL HIGH (ref 70–99)
Glucose-Capillary: 142 mg/dL — ABNORMAL HIGH (ref 70–99)

## 2022-07-13 LAB — SURGICAL PCR SCREEN
MRSA, PCR: NEGATIVE
Staphylococcus aureus: NEGATIVE

## 2022-07-13 SURGERY — ANTERIOR CERVICAL DECOMPRESSION/DISCECTOMY FUSION 1 LEVEL/HARDWARE REMOVAL
Anesthesia: General

## 2022-07-13 MED ORDER — CELECOXIB 200 MG PO CAPS
200.0000 mg | ORAL_CAPSULE | Freq: Once | ORAL | Status: AC
  Administered 2022-07-13: 200 mg via ORAL
  Filled 2022-07-13: qty 1

## 2022-07-13 MED ORDER — EPHEDRINE SULFATE-NACL 50-0.9 MG/10ML-% IV SOSY
PREFILLED_SYRINGE | INTRAVENOUS | Status: DC | PRN
  Administered 2022-07-13 (×2): 10 mg via INTRAVENOUS
  Administered 2022-07-13: 5 mg via INTRAVENOUS

## 2022-07-13 MED ORDER — CEFAZOLIN SODIUM 1 G IJ SOLR
INTRAMUSCULAR | Status: AC
  Filled 2022-07-13: qty 20

## 2022-07-13 MED ORDER — DEXAMETHASONE SODIUM PHOSPHATE 10 MG/ML IJ SOLN
INTRAMUSCULAR | Status: DC | PRN
  Administered 2022-07-13: 10 mg via INTRAVENOUS

## 2022-07-13 MED ORDER — ONDANSETRON HCL 4 MG/2ML IJ SOLN
INTRAMUSCULAR | Status: AC
  Filled 2022-07-13: qty 2

## 2022-07-13 MED ORDER — THROMBIN 5000 UNITS EX SOLR
CUTANEOUS | Status: AC
  Filled 2022-07-13: qty 5000

## 2022-07-13 MED ORDER — THROMBIN 5000 UNITS EX SOLR
OROMUCOSAL | Status: DC | PRN
  Administered 2022-07-13 (×2): 5 mL via TOPICAL

## 2022-07-13 MED ORDER — PHENYLEPHRINE 80 MCG/ML (10ML) SYRINGE FOR IV PUSH (FOR BLOOD PRESSURE SUPPORT)
PREFILLED_SYRINGE | INTRAVENOUS | Status: DC | PRN
  Administered 2022-07-13 (×3): 160 ug via INTRAVENOUS
  Administered 2022-07-13: 80 ug via INTRAVENOUS
  Administered 2022-07-13 (×2): 240 ug via INTRAVENOUS
  Administered 2022-07-13: 160 ug via INTRAVENOUS

## 2022-07-13 MED ORDER — PROPOFOL 10 MG/ML IV BOLUS
INTRAVENOUS | Status: DC | PRN
  Administered 2022-07-13: 150 mg via INTRAVENOUS

## 2022-07-13 MED ORDER — PROPOFOL 10 MG/ML IV BOLUS
INTRAVENOUS | Status: AC
  Filled 2022-07-13: qty 20

## 2022-07-13 MED ORDER — SODIUM CHLORIDE 0.9 % IV SOLN: INTRAVENOUS | Status: DC

## 2022-07-13 MED ORDER — SUGAMMADEX SODIUM 200 MG/2ML IV SOLN
INTRAVENOUS | Status: DC | PRN
  Administered 2022-07-13: 150 mg via INTRAVENOUS

## 2022-07-13 MED ORDER — OXYCODONE HCL 5 MG PO TABS: 5.0000 mg | ORAL_TABLET | Freq: Four times a day (QID) | ORAL | 0 refills | Status: AC | PRN

## 2022-07-13 MED ORDER — PHENYLEPHRINE 80 MCG/ML (10ML) SYRINGE FOR IV PUSH (FOR BLOOD PRESSURE SUPPORT)
PREFILLED_SYRINGE | INTRAVENOUS | Status: AC
  Filled 2022-07-13: qty 10

## 2022-07-13 MED ORDER — MIDAZOLAM HCL 2 MG/2ML IJ SOLN
INTRAMUSCULAR | Status: AC
  Filled 2022-07-13: qty 2

## 2022-07-13 MED ORDER — THROMBIN (RECOMBINANT) 5000 UNITS EX SOLR
CUTANEOUS | Status: DC | PRN
  Administered 2022-07-13: 10 mL via TOPICAL

## 2022-07-13 MED ORDER — MIDAZOLAM HCL 2 MG/2ML IJ SOLN
INTRAMUSCULAR | Status: DC | PRN
  Administered 2022-07-13: 2 mg via INTRAVENOUS

## 2022-07-13 MED ORDER — CHLORHEXIDINE GLUCONATE 0.12 % MT SOLN
15.0000 mL | Freq: Once | OROMUCOSAL | Status: AC
  Administered 2022-07-13: 15 mL via OROMUCOSAL
  Filled 2022-07-13: qty 15

## 2022-07-13 MED ORDER — DEXAMETHASONE SODIUM PHOSPHATE 10 MG/ML IJ SOLN
INTRAMUSCULAR | Status: AC
  Filled 2022-07-13: qty 1

## 2022-07-13 MED ORDER — FENTANYL CITRATE (PF) 250 MCG/5ML IJ SOLN
INTRAMUSCULAR | Status: AC
  Filled 2022-07-13: qty 5

## 2022-07-13 MED ORDER — 0.9 % SODIUM CHLORIDE (POUR BTL) OPTIME
TOPICAL | Status: DC | PRN
  Administered 2022-07-13: 1000 mL

## 2022-07-13 MED ORDER — ACETAMINOPHEN 500 MG PO TABS
1000.0000 mg | ORAL_TABLET | Freq: Once | ORAL | Status: AC
  Administered 2022-07-13: 1000 mg via ORAL
  Filled 2022-07-13: qty 2

## 2022-07-13 MED ORDER — ORAL CARE MOUTH RINSE: 15.0000 mL | Freq: Once | OROMUCOSAL | Status: AC

## 2022-07-13 MED ORDER — LIDOCAINE-EPINEPHRINE 0.5 %-1:200000 IJ SOLN
INTRAMUSCULAR | Status: AC
  Filled 2022-07-13: qty 1

## 2022-07-13 MED ORDER — EPHEDRINE 5 MG/ML INJ
INTRAVENOUS | Status: AC
  Filled 2022-07-13: qty 5

## 2022-07-13 MED ORDER — LIDOCAINE 2% (20 MG/ML) 5 ML SYRINGE
INTRAMUSCULAR | Status: AC
  Filled 2022-07-13: qty 5

## 2022-07-13 MED ORDER — INSULIN ASPART 100 UNIT/ML IJ SOLN
0.0000 [IU] | INTRAMUSCULAR | Status: DC | PRN
  Administered 2022-07-13: 6 [IU] via SUBCUTANEOUS
  Filled 2022-07-13: qty 1

## 2022-07-13 MED ORDER — FENTANYL CITRATE (PF) 100 MCG/2ML IJ SOLN
INTRAMUSCULAR | Status: DC | PRN
  Administered 2022-07-13: 100 ug via INTRAVENOUS
  Administered 2022-07-13: 50 ug via INTRAVENOUS

## 2022-07-13 MED ORDER — THROMBIN 5000 UNITS EX SOLR
CUTANEOUS | Status: AC
  Filled 2022-07-13: qty 10000

## 2022-07-13 MED ORDER — ROCURONIUM BROMIDE 10 MG/ML (PF) SYRINGE
PREFILLED_SYRINGE | INTRAVENOUS | Status: AC
  Filled 2022-07-13: qty 10

## 2022-07-13 MED ORDER — ROCURONIUM BROMIDE 10 MG/ML (PF) SYRINGE
PREFILLED_SYRINGE | INTRAVENOUS | Status: DC | PRN
  Administered 2022-07-13: 20 mg via INTRAVENOUS
  Administered 2022-07-13: 50 mg via INTRAVENOUS

## 2022-07-13 MED ORDER — FENTANYL CITRATE (PF) 100 MCG/2ML IJ SOLN: 25.0000 ug | INTRAMUSCULAR | Status: DC | PRN

## 2022-07-13 MED ORDER — ONDANSETRON HCL 4 MG/2ML IJ SOLN
INTRAMUSCULAR | Status: DC | PRN
  Administered 2022-07-13: 4 mg via INTRAVENOUS

## 2022-07-13 MED ORDER — LIDOCAINE 2% (20 MG/ML) 5 ML SYRINGE
INTRAMUSCULAR | Status: DC | PRN
  Administered 2022-07-13: 60 mg via INTRAVENOUS

## 2022-07-13 MED ORDER — TIZANIDINE HCL 4 MG PO TABS: 4.0000 mg | ORAL_TABLET | Freq: Four times a day (QID) | ORAL | 0 refills | Status: DC | PRN

## 2022-07-13 MED ORDER — CEFAZOLIN SODIUM-DEXTROSE 2-3 GM-%(50ML) IV SOLR
INTRAVENOUS | Status: DC | PRN
  Administered 2022-07-13: 2 g via INTRAVENOUS

## 2022-07-13 MED ORDER — LIDOCAINE-EPINEPHRINE 0.5 %-1:200000 IJ SOLN
INTRAMUSCULAR | Status: DC | PRN
  Administered 2022-07-13: 4 mL

## 2022-07-13 SURGICAL SUPPLY — 50 items
ADH SKN CLS APL DERMABOND .7 (GAUZE/BANDAGES/DRESSINGS) ×1
BAG COUNTER SPONGE SURGICOUNT (BAG) ×1 IMPLANT
BAG SPNG CNTER NS LX DISP (BAG) ×2
BAND INSRT 18 STRL LF DISP RB (MISCELLANEOUS) ×2
BAND RUBBER #18 3X1/16 STRL (MISCELLANEOUS) ×2 IMPLANT
BLADE CLIPPER SURG (BLADE) IMPLANT
BUR DRUM 4.0 (BURR) ×1 IMPLANT
BUR MATCHSTICK NEURO 3.0 LAGG (BURR) ×1 IMPLANT
CANISTER SUCT 3000ML PPV (MISCELLANEOUS) ×1 IMPLANT
DERMABOND ADVANCED .7 DNX12 (GAUZE/BANDAGES/DRESSINGS) ×1 IMPLANT
DRAPE HALF SHEET 40X57 (DRAPES) IMPLANT
DRAPE LAPAROTOMY 100X72 PEDS (DRAPES) ×1 IMPLANT
DRAPE MICROSCOPE SLANT 54X150 (MISCELLANEOUS) ×1 IMPLANT
DURAPREP 6ML APPLICATOR 50/CS (WOUND CARE) ×1 IMPLANT
ELECT COATED BLADE 2.86 ST (ELECTRODE) ×1 IMPLANT
ELECT REM PT RETURN 9FT ADLT (ELECTROSURGICAL) ×1
ELECTRODE REM PT RTRN 9FT ADLT (ELECTROSURGICAL) ×1 IMPLANT
GAUZE 4X4 16PLY ~~LOC~~+RFID DBL (SPONGE) IMPLANT
GLOVE BIOGEL PI IND STRL 7.5 (GLOVE) IMPLANT
GLOVE ECLIPSE 6.5 STRL STRAW (GLOVE) ×1 IMPLANT
GLOVE EXAM NITRILE XL STR (GLOVE) IMPLANT
GLOVE SS BIOGEL STRL SZ 6.5 (GLOVE) IMPLANT
GOWN STRL REUS W/ TWL LRG LVL3 (GOWN DISPOSABLE) ×2 IMPLANT
GOWN STRL REUS W/ TWL XL LVL3 (GOWN DISPOSABLE) IMPLANT
GOWN STRL REUS W/TWL 2XL LVL3 (GOWN DISPOSABLE) IMPLANT
GOWN STRL REUS W/TWL LRG LVL3 (GOWN DISPOSABLE) ×2
GOWN STRL REUS W/TWL XL LVL3 (GOWN DISPOSABLE) ×2
GRAFT CORT CANC 14X8.25X11 5D (Bone Implant) IMPLANT
HEMOSTAT POWDER SURGIFOAM 1G (HEMOSTASIS) IMPLANT
KIT BASIN OR (CUSTOM PROCEDURE TRAY) ×1 IMPLANT
KIT TURNOVER KIT B (KITS) ×1 IMPLANT
NDL HYPO 25X1 1.5 SAFETY (NEEDLE) ×1 IMPLANT
NDL SPNL 22GX3.5 QUINCKE BK (NEEDLE) ×1 IMPLANT
NEEDLE HYPO 25X1 1.5 SAFETY (NEEDLE) ×1 IMPLANT
NEEDLE SPNL 22GX3.5 QUINCKE BK (NEEDLE) ×1 IMPLANT
NS IRRIG 1000ML POUR BTL (IV SOLUTION) ×1 IMPLANT
PACK LAMINECTOMY NEURO (CUSTOM PROCEDURE TRAY) ×1 IMPLANT
PAD ARMBOARD 7.5X6 YLW CONV (MISCELLANEOUS) ×3 IMPLANT
PLATE SPN ACP 1.6X30 LX1 (Plate) IMPLANT
SCREW ACP VA SD 3.5X15 (Screw) IMPLANT
SCREW ACP VA ST 3.5X15 (Screw) IMPLANT
SPIKE FLUID TRANSFER (MISCELLANEOUS) ×1 IMPLANT
SPONGE INTESTINAL PEANUT (DISPOSABLE) ×1 IMPLANT
SPONGE SURGIFOAM ABS GEL SZ50 (HEMOSTASIS) ×1 IMPLANT
SUT VIC AB 0 CT1 27 (SUTURE) ×1
SUT VIC AB 0 CT1 27XBRD ANTBC (SUTURE) IMPLANT
SUT VIC AB 3-0 SH 8-18 (SUTURE) ×1 IMPLANT
TOWEL GREEN STERILE (TOWEL DISPOSABLE) ×1 IMPLANT
TOWEL GREEN STERILE FF (TOWEL DISPOSABLE) ×1 IMPLANT
WATER STERILE IRR 1000ML POUR (IV SOLUTION) ×1 IMPLANT

## 2022-07-13 NOTE — Anesthesia Postprocedure Evaluation (Signed)
Anesthesia Post Note  Patient: Raymond [name]  Procedure(s) Performed: Cervical Three-Four Anterior Cervical Discectomy Fusion with Plate removal     Patient location during evaluation: PACU Anesthesia Type: General Level of consciousness: awake and alert Pain management: pain level controlled Vital Signs Assessment: post-procedure vital signs reviewed and stable Respiratory status: spontaneous breathing, nonlabored ventilation, respiratory function stable and patient connected to nasal cannula oxygen Cardiovascular status: blood pressure returned to baseline and stable Postop Assessment: no apparent nausea or vomiting Anesthetic complications: yes   Encounter Notable Events  Notable Event Outcome Phase Comment  Difficult to intubate - expected  Intraprocedure Filed from anesthesia note documentation.    Last Vitals:  Vitals:   07/13/22 1600 07/13/22 1615  BP: (!) 156/75 (!) 158/59  Pulse: 75 72  Resp: 18 18  Temp:  36.5 C  SpO2: 94% 94%    Last Pain:  Vitals:   07/13/22 1615  TempSrc:   PainSc: 0-No pain                 Belenda Cruise P Colletta Spillers

## 2022-07-13 NOTE — Discharge Instructions (Addendum)

## 2022-07-13 NOTE — Transfer of Care (Signed)
Immediate Anesthesia Transfer of Care Note  Patient: Raymond [name]  Procedure(s) Performed: Cervical Three-Four Anterior Cervical Discectomy Fusion with Plate removal  Patient Location: PACU  Anesthesia Type:General  Level of Consciousness: awake and oriented  Airway & Oxygen Therapy: Patient Spontanous Breathing  Post-op Assessment: Report given to RN  Post vital signs: Reviewed and stable  Last Vitals:  Vitals Value Taken Time  BP 151/83 07/13/22 1545  Temp    Pulse 77 07/13/22 1547  Resp 17 07/13/22 1547  SpO2 96 % 07/13/22 1547  Vitals shown include unvalidated device data.  Last Pain:  Vitals:   07/13/22 0930  TempSrc:   PainSc: 0-No pain         Complications:  Encounter Notable Events  Notable Event Outcome Phase Comment  Difficult to intubate - expected  Intraprocedure Filed from anesthesia note documentation.

## 2022-07-13 NOTE — H&P (Signed)
BP (!) 162/64   Pulse 70   Temp 97.7 F (36.5 C) (Oral)   Resp 18   Ht 6' (1.829 m)   Wt 73 kg   SpO2 100%   BMI 21.84 kg/m     Raymond [name] comes in today, status post problems he has had for the last 3 months walking.  He can barely walk a straight line, barely stand up straight, and he says this is getting worse by the day.  I took Raymond [name] to the operating room previously and he was decompressed and arthrodesed from C4 to C6.  He says he feels now exactly like he did before and that it is not at all helpful.  Nothing in particular happened 3 months ago, it just was when he noticed the problem starting, and that was gait instability for the most part.  He is not having a significant amount of pain.  He has dropped some things, but not more than I guess what he puts down as normal, but his walking is quite poor.  Past medical history includes diabetes type 2, chronic kidney disease, coronary artery disease, cervical spondylosis, hypertension, and hyperlipidemia. I did his previous operation in 2000.  He, according to his primary care physician, had reported some dizziness.  He also had some falls.  An MRI was performed and showed he had fairly severe cervical stenosis at C3-4, just 1 level above the prior arthrodesis.  On his registration form, in his words, having balance issues so bad I can hardly walk, just woke up like this a few months ago.  He had a stent placed in 2022 with Dr. Donzetta [facility]. Medications are Chlorthalidone, Trazodone, Spiriva, Cilostal, Amlodipine, Metoprolol, Rosuvastatin, Farxiga, Doxazosin, Baby Aspirin, and Iron.  He has no known drug allergies.     REVIEW OF SYSTEMS :  Positive for easy bruising, easy bleeding, cold intolerance, arm and leg swelling, arm and leg pain, muscle weakness, dizziness.     MEDICAL HISTORY :  Includes hypertension, chronic renal disease, diabetes.     He has no bowel or bladder dysfunction.  Does feel weakness in his  legs.     SOCIAL HISTORY :  He is married, does not live alone.  He does have children. He is right-handed.  No information provided about alcohol.  He does smoke cigarettes and has not quit.     PHYSICAL EXAMINATION :  He is hyperreflexic at the knees, just 1+ at the ankles.  Hyperreflexic 3+ at the biceps, triceps, and brachioradialis.  Proprioception is intact in the upper and lower extremities.  Romberg is positive.  He is unable to tandem walk.     IMAGING :  MRI is reviewed. What it shows is a stenotic canal at C3-4, along with some cord signal.  His previous arthrodesis is whole. Looked at a CT angio from a year ago and it shows that he has a solid arthrodesis and that CT is from 2022.  I think he needs another ACDF at C3-4 for that.  He is myelopathic.  He has significant gait disturbance, he says.  He is familiar with the risks and benefits.

## 2022-07-13 NOTE — Op Note (Signed)
07/13/2022  4:03 PM  PATIENT:  Raymond [name]  61 y.o. male  PRE-OPERATIVE DIAGNOSIS:  Myelopathy concurrent with and due to spinal stenosis of cervical region C3/4  POST-OPERATIVE DIAGNOSIS:  Myelopathy concurrent with and due to spinal stenosis of cervical region C3/4  PROCEDURE:  Anterior Cervical decompression C3/4 Arthrodesis C3-4 with 85m structural allograft Anterior instrumentation(nuvasive acp) C3-4 Removal C4-6 screws and plate SURGEON:   Surgeon(s): CAshok Pall MD NConsuella Lose MD   ASSISTANTS:Nundkumar, NNena Polio ANESTHESIA:   general  EBL:  Total I/O In: 1000 [I.V.:1000] Out: 50 [Blood:50]  BLOOD ADMINISTERED:none  CELL SAVER GIVEN:none  COUNT:per nursing  DRAINS: none   SPECIMEN:  No Specimen  DICTATION: Mr. ONilssonwas taken to the operating room, intubated, and placed under general anesthesia without difficulty. He was positioned supine with his head in slight extension on a horseshoe headrest. The neck was prepped and draped in a sterile manner. I infiltrated 4 cc's 1/2%lidocaine/1:200,000 strength epinephrine into the planned incision starting from the midline to the medial border of the left sternocleidomastoid muscle. I opened the incision with a 10 blade and dissected sharply through soft tissue to the platysma. I dissected in the plane superior to the platysma both rostrally and caudally. I then opened the platysma in a horizontal fashion with Metzenbaum scissors, and dissected in the inferior plane rostrally and caudally. With both blunt and sharp technique I created an avascular corridor to the cervical spine. I then reflected the longus colli from C6 to C3 and placed self retaining retractors.  I removed the plate and screws present at C4-6. All of the hardware was removed without difficulty.  I opened the disc space(s) at 3/4 with a 15 blade. We,removed disc with curettes, Kerrison punches, and the drill. Using the drill I removed osteophytes  and prepared for the decompression.  We decompressed the spinal canal and the C4 root(s) with the drill, Kerrison punches, and the curettes. We used the microscope to aid in microdissection. I removed the posterior longitudinal ligament to fully expose and decompress the thecal sac. We exposed the roots laterally taking down the C3/4 uncovertebral joints. With the decompression complete we moved on to the arthrodesis. We used the drill to level the surfaces of C3 and 4. I removed soft tissue to prepare the disc space and the bony surfaces. I measured the space and placed an 876mstructural allograft into the disc space.  We then placed the anterior instrumentation. I placed 2 screws in each vertebral body through the plate. I locked the screws into place. Intraoperative xray showed the graft, plate, and screws to be in good position. I irrigated the wound, achieved hemostasis, and closed the wound in layers. I approximated the platysma, and the subcuticular plane with vicryl sutures. I used Dermabond for a sterile dressing.   PLAN OF CARE: Discharge to home after PACU  PATIENT DISPOSITION:  PACU - hemodynamically stable.   Delay start of Pharmacological VTE agent (>24hrs) due to surgical blood loss or risk of bleeding:  no

## 2022-07-13 NOTE — Anesthesia Procedure Notes (Signed)
Procedure Name: Intubation Date/Time: 07/13/2022 1:10 PM  Performed by: Barrington Ellison, CRNAPre-anesthesia Checklist: Patient identified, Emergency Drugs available, Suction available and Patient being monitored Patient Re-evaluated:Patient Re-evaluated prior to induction Oxygen Delivery Method: Circle System Utilized Preoxygenation: Pre-oxygenation with 100% oxygen Induction Type: IV induction Ventilation: Mask ventilation without difficulty Laryngoscope Size: Glidescope and 4 Grade View: Grade I Tube type: Oral Tube size: 7.5 mm Number of attempts: 1 Airway Equipment and Method: Oral airway and Rigid stylet Placement Confirmation: ETT inserted through vocal cords under direct vision, positive ETCO2 and breath sounds checked- equal and bilateral Secured at: 22 cm Tube secured with: Tape Dental Injury: Teeth and Oropharynx as per pre-operative assessment  Difficulty Due To: Difficulty was anticipated Comments: Elective Glidescope d/t etiology

## 2022-07-14 MED FILL — Thrombin For Soln 5000 Unit: CUTANEOUS | Qty: 5000 | Status: AC

## 2022-07-14 MED FILL — Thrombin For Soln 5000 Unit: CUTANEOUS | Qty: 2 | Status: AC

## 2022-07-15 ENCOUNTER — Encounter (HOSPITAL_COMMUNITY): Payer: Self-pay | Admitting: Neurosurgery

## 2022-07-18 NOTE — Discharge Summary (Signed)
Physician Discharge Summary  Patient ID: Raymond [name] MRN: [id number] DOB/AGE: Oct 24, 1960 61 y.o.  Admit date: 07/13/2022 Discharge date: 07/13/2022  Admission Diagnoses:Myelopathy concurrent with and due to spinal stenosis of cervical region C3/4   Discharge Diagnoses:Myelopathy concurrent with and due to spinal stenosis of cervical region C3/4   Principal Problem:   Cervical vertebral fusion   Discharged Condition: good  Hospital Course: Raymond [name] was admitted and taken to the operating room for an uncomplicated ACDF at W0/9. Nuvasive ACP plate used.   Treatments: surgery: Anterior Cervical decompression C3/4 Arthrodesis C3-4 with 71m structural allograft Anterior instrumentation(nuvasive acp) C3-4 Removal C4-6 screws and plate  Discharge Exam: Blood pressure (!) 158/59, pulse 72, temperature 97.7 F (36.5 C), resp. rate 18, height 6' (1.829 m), weight 73 kg, SpO2 94 %. General appearance: alert, cooperative, and appears stated age Neurologic: Mental status: Alert, oriented, thought content appropriate Cranial nerves: normal Sensory: normal Motor: grossly normal Coordination: Romberg test abnormal Gait: spastic  Disposition: Discharge disposition: 01-Home or Self Care      Myelopathy concurrent with and due to spinal stenosis of cervical region Discharge Instructions     Call MD for:  severe uncontrolled pain   Complete by: As directed    Call MD for:  temperature >100.4   Complete by: As directed    Diet clear liquid   Complete by: As directed    Discharge instructions   Complete by: As directed    Anterior Cervical Fusion Care After Pinching of the nerves is a common cause of long-term pain. When this happens, a procedure called an anterior cervical fusion is sometimes performed. It relieves the pressure on the pinched nerve roots or spinal cord in the neck. An anterior cervical fusion means that the operation is done through the front (anterior) of your  neck to fuse bones in your neck together. This procedure is done to relieve the pressure on pinched nerve roots or spinal cord. This operation is done to control the movement of your spine, which may be pressing on the nerves. This may relieve the pain. The procedure that stops the movement of the spine is called a fusion. The cut by the surgeon (incision) is usually within a skin fold line under your chin. After moving the neck muscles gently apart, the neurosurgeon uses an operating microscope and removes the injured intervertebral disk (the cushion or pad of tissue between the bones of the spine). This takes the pressure off the nerves or spinal cord. This is called decompression. The area where the disc was removed is then filled with a bone graft. The graft will fuse the vertebrae together over time. This means it causes the vertebral bodies to grow together. The bone graft may be obtained from your own bone (your hip for example), or may be obtained from a bone bank. Receiving bone from a bone bank is similar to a blood bank, only the bone comes from human donors who have recently died. This type of graft is referred to as allograft bone. The preformed bone plug is safe and will not be rejected by your body. It does not contain blood cells. In some cases, the surgeon may use hardware in your neck to help stabilize it. This means that metal plates or pins or screws may be used to: Provide extra support to the neck.  Help the bones to grow together more easily.  A cervical fusion procedure takes a couple hours to several hours, depending on what needs  to be done. Your caregiver will be able to answer your questions for you. HOME CARE INSTRUCTIONS  It will be normal to have a sore throat and have difficulty swallowing foods for a couple weeks following surgery. See your caregiver if this seems to be getting worse rather than better.  You may resume normal diet and activities as directed or allowed.  Generally, walking and stair climbing are fine. Avoid lifting more than ten pounds and do no lifting above your head.  If given a cervical collar, remove only for bathing and eating, or as directed.   Use only showers for cleaning up, with no bathing, until seen.  You may apply ice to the surgical or bone donor site for 15 to 20 minutes each hour while awake for the first couple days following surgery. Put the ice in a plastic bag and place a towel between the bag of ice and your skin.  Change dressings if necessary or as directed.  You may drive in 10 days  Take prescribed medication as directed. Only take over-the-counter or prescription medicines for pain, discomfort, or fever as directed by your caregiver.  Make an appointment to see your caregiver for suture or staple removal when instructed.  If physical therapy was prescribed, follow your caregiver's directions.  SEEK IMMEDIATE MEDICAL CARE IF: There is redness, swelling, or increasing pain in the wound.  There is pus coming from the wound.  An unexplained oral temperature over 102 F (38.9 C) develops.  There is a bad smell coming from the wound or dressing.  You have swelling in your calf or leg.  You develop shortness of breath or chest pain.  The wound edges break open after sutures or staples have been removed.  Your pain is not controlled with medicine.  You seem to be getting worse rather than better.  Document Released: 04/12/2004 Document Revised: 05/11/2011 Document Reviewed: 06/18/2008 [facility] Patient Information 2012 [place].   Discharge wound care:   Complete by: As directed    You may shower tomorrow. Your neck can get wet. Do not scrub the incision. You do not need to cover the incision.   Increase activity slowly   Complete by: As directed    No dressing needed   Complete by: As directed       Allergies as of 07/13/2022   No Known Allergies      Medication List     TAKE these medications     acetaminophen 325 MG tablet Commonly known as: TYLENOL Take 325 mg by mouth every 6 (six) hours as needed for mild pain (neck).   amLODipine 10 MG tablet Commonly known as: NORVASC Take 10 mg by mouth daily.   aspirin 81 MG tablet Take 81 mg by mouth daily.   chlorthalidone 25 MG tablet Commonly known as: HYGROTON Take 25 mg by mouth daily.   cilostazol 100 MG tablet Commonly known as: PLETAL TAKE 1 TABLET 2 TIMES A DAY BEFORE A MEAL.   doxazosin 4 MG tablet Commonly known as: Cardura Take 1 tablet (4 mg total) by mouth daily.   famotidine 20 MG tablet Commonly known as: PEPCID Take 20 mg by mouth daily as needed for heartburn or indigestion.   Farxiga 5 MG Tabs tablet Generic drug: dapagliflozin propanediol Take 5 mg by mouth daily.   ferrous sulfate 325 (65 FE) MG EC tablet Take 1 tablet (325 mg total) by mouth daily with breakfast.   fluticasone 50 MCG/ACT nasal spray Commonly known  as: FLONASE Place 2 sprays into both nostrils daily as needed for allergies or rhinitis.   metoprolol succinate 50 MG 24 hr tablet Commonly known as: TOPROL-XL Take 50 mg by mouth daily.   oxyCODONE 5 MG immediate release tablet Commonly known as: Roxicodone Take 1 tablet (5 mg total) by mouth every 6 (six) hours as needed for up to 8 days for severe pain.   polyvinyl alcohol 1.4 % ophthalmic solution Commonly known as: LIQUIFILM TEARS Place 1 drop into both eyes as needed for dry eyes.   Potassium 99 MG Tabs Take 99 mg by mouth daily.   rosuvastatin 20 MG tablet Commonly known as: CRESTOR Take 2 tablets (40 mg total) by mouth daily. What changed: how much to take   Spiriva HandiHaler 18 MCG inhalation capsule Generic drug: tiotropium Place 18 mcg into inhaler and inhale daily.   tiZANidine 4 MG tablet Commonly known as: ZANAFLEX Take 1 tablet (4 mg total) by mouth every 6 (six) hours as needed for muscle spasms.   traZODone 100 MG tablet Commonly known as:  DESYREL Take 100 mg by mouth at bedtime.               Discharge Care Instructions  (From admission, onward)           Start     Ordered   07/13/22 0000  No dressing needed        07/13/22 1601   07/13/22 0000  Discharge wound care:       Comments: You may shower tomorrow. Your neck can get wet. Do not scrub the incision. You do not need to cover the incision.   07/13/22 1601            Follow-up Information     Ashok Pall, MD Follow up.   Specialty: Neurosurgery Why: keep your scheduled appointment Contact information: [address] [place] [address] [phone]                 Signed: Ashok Pall 07/18/2022, 9:27 PM

## 2022-08-08 DIAGNOSIS — M4802 Spinal stenosis, cervical region: Secondary | ICD-10-CM | POA: Diagnosis not present

## 2022-08-16 NOTE — Progress Notes (Signed)
Suttle, Rosanne Ashing, MD  Donita Brooks D We are strict about this due to bleeding risks and need for observation of the patient.  Is this just a transportation issue from [place] or are there other reasons it is requested to be delayed?  Dylan  12/5 spoke with patient and he wants to hold off on scheduling BX at this time due to him having neck surgery 3 wks ago.  [facility] has been notified as well via IB message  Kenney Houseman

## 2022-08-17 DIAGNOSIS — R808 Other proteinuria: Secondary | ICD-10-CM | POA: Diagnosis not present

## 2022-08-17 DIAGNOSIS — N189 Chronic kidney disease, unspecified: Secondary | ICD-10-CM | POA: Diagnosis not present

## 2022-08-17 DIAGNOSIS — D638 Anemia in other chronic diseases classified elsewhere: Secondary | ICD-10-CM | POA: Diagnosis not present

## 2022-08-17 DIAGNOSIS — N19 Unspecified kidney failure: Secondary | ICD-10-CM | POA: Diagnosis not present

## 2022-08-17 DIAGNOSIS — N170 Acute kidney failure with tubular necrosis: Secondary | ICD-10-CM | POA: Diagnosis not present

## 2022-08-17 DIAGNOSIS — R600 Localized edema: Secondary | ICD-10-CM | POA: Diagnosis not present

## 2022-08-17 DIAGNOSIS — N280 Ischemia and infarction of kidney: Secondary | ICD-10-CM | POA: Diagnosis not present

## 2022-08-17 DIAGNOSIS — I5032 Chronic diastolic (congestive) heart failure: Secondary | ICD-10-CM | POA: Diagnosis not present

## 2022-08-17 DIAGNOSIS — E1122 Type 2 diabetes mellitus with diabetic chronic kidney disease: Secondary | ICD-10-CM | POA: Diagnosis not present

## 2022-08-24 DIAGNOSIS — R808 Other proteinuria: Secondary | ICD-10-CM | POA: Diagnosis not present

## 2022-08-24 DIAGNOSIS — I5032 Chronic diastolic (congestive) heart failure: Secondary | ICD-10-CM | POA: Diagnosis not present

## 2022-08-24 DIAGNOSIS — N189 Chronic kidney disease, unspecified: Secondary | ICD-10-CM | POA: Diagnosis not present

## 2022-08-24 DIAGNOSIS — D631 Anemia in chronic kidney disease: Secondary | ICD-10-CM | POA: Diagnosis not present

## 2022-08-24 DIAGNOSIS — N170 Acute kidney failure with tubular necrosis: Secondary | ICD-10-CM | POA: Diagnosis not present

## 2022-08-24 DIAGNOSIS — E1122 Type 2 diabetes mellitus with diabetic chronic kidney disease: Secondary | ICD-10-CM | POA: Diagnosis not present

## 2022-08-24 DIAGNOSIS — E871 Hypo-osmolality and hyponatremia: Secondary | ICD-10-CM | POA: Diagnosis not present

## 2022-08-31 ENCOUNTER — Other Ambulatory Visit: Payer: Self-pay | Admitting: Vascular Surgery

## 2022-08-31 DIAGNOSIS — I779 Disorder of arteries and arterioles, unspecified: Secondary | ICD-10-CM

## 2022-10-03 ENCOUNTER — Other Ambulatory Visit: Payer: Self-pay | Admitting: Vascular Surgery

## 2022-10-03 DIAGNOSIS — I779 Disorder of arteries and arterioles, unspecified: Secondary | ICD-10-CM

## 2022-10-27 DIAGNOSIS — N189 Chronic kidney disease, unspecified: Secondary | ICD-10-CM | POA: Diagnosis not present

## 2022-10-27 DIAGNOSIS — E871 Hypo-osmolality and hyponatremia: Secondary | ICD-10-CM | POA: Diagnosis not present

## 2022-10-27 DIAGNOSIS — N170 Acute kidney failure with tubular necrosis: Secondary | ICD-10-CM | POA: Diagnosis not present

## 2022-10-27 DIAGNOSIS — I5032 Chronic diastolic (congestive) heart failure: Secondary | ICD-10-CM | POA: Diagnosis not present

## 2022-10-27 DIAGNOSIS — E1122 Type 2 diabetes mellitus with diabetic chronic kidney disease: Secondary | ICD-10-CM | POA: Diagnosis not present

## 2022-10-27 DIAGNOSIS — R808 Other proteinuria: Secondary | ICD-10-CM | POA: Diagnosis not present

## 2022-10-27 DIAGNOSIS — D631 Anemia in chronic kidney disease: Secondary | ICD-10-CM | POA: Diagnosis not present

## 2022-10-31 ENCOUNTER — Other Ambulatory Visit: Payer: Self-pay | Admitting: Vascular Surgery

## 2022-10-31 DIAGNOSIS — I779 Disorder of arteries and arterioles, unspecified: Secondary | ICD-10-CM

## 2022-11-03 DIAGNOSIS — Z716 Tobacco abuse counseling: Secondary | ICD-10-CM | POA: Diagnosis not present

## 2022-11-03 DIAGNOSIS — R808 Other proteinuria: Secondary | ICD-10-CM | POA: Diagnosis not present

## 2022-11-03 DIAGNOSIS — E1122 Type 2 diabetes mellitus with diabetic chronic kidney disease: Secondary | ICD-10-CM | POA: Diagnosis not present

## 2022-11-03 DIAGNOSIS — E871 Hypo-osmolality and hyponatremia: Secondary | ICD-10-CM | POA: Diagnosis not present

## 2022-11-03 DIAGNOSIS — I5032 Chronic diastolic (congestive) heart failure: Secondary | ICD-10-CM | POA: Diagnosis not present

## 2022-11-03 DIAGNOSIS — N189 Chronic kidney disease, unspecified: Secondary | ICD-10-CM | POA: Diagnosis not present

## 2022-11-03 DIAGNOSIS — N19 Unspecified kidney failure: Secondary | ICD-10-CM | POA: Diagnosis not present

## 2022-11-30 ENCOUNTER — Encounter: Payer: Self-pay | Admitting: Neurology

## 2022-11-30 ENCOUNTER — Ambulatory Visit: Payer: Medicare Other | Admitting: Neurology

## 2022-12-22 DIAGNOSIS — N19 Unspecified kidney failure: Secondary | ICD-10-CM | POA: Diagnosis not present

## 2022-12-22 DIAGNOSIS — I5032 Chronic diastolic (congestive) heart failure: Secondary | ICD-10-CM | POA: Diagnosis not present

## 2022-12-22 DIAGNOSIS — N189 Chronic kidney disease, unspecified: Secondary | ICD-10-CM | POA: Diagnosis not present

## 2022-12-22 DIAGNOSIS — E871 Hypo-osmolality and hyponatremia: Secondary | ICD-10-CM | POA: Diagnosis not present

## 2022-12-22 DIAGNOSIS — E1122 Type 2 diabetes mellitus with diabetic chronic kidney disease: Secondary | ICD-10-CM | POA: Diagnosis not present

## 2022-12-22 DIAGNOSIS — R808 Other proteinuria: Secondary | ICD-10-CM | POA: Diagnosis not present

## 2023-01-06 ENCOUNTER — Other Ambulatory Visit: Payer: Self-pay

## 2023-01-06 DIAGNOSIS — I779 Disorder of arteries and arterioles, unspecified: Secondary | ICD-10-CM

## 2023-01-06 DIAGNOSIS — I6523 Occlusion and stenosis of bilateral carotid arteries: Secondary | ICD-10-CM

## 2023-01-06 DIAGNOSIS — I70213 Atherosclerosis of native arteries of extremities with intermittent claudication, bilateral legs: Secondary | ICD-10-CM

## 2023-01-06 MED ORDER — CILOSTAZOL 100 MG PO TABS: ORAL_TABLET | ORAL | 1 refills | Status: DC

## 2023-01-06 NOTE — Telephone Encounter (Signed)
Pt called to request Pletal refill.  Reviewed pt's chart, returned call for clarification, no answer, lf vm.  Pt returned call, two identifiers used. Pt's Pletal was refilled for enough to last to next office visit since pt hasn't been seen in office since 2022. Appts scheduled for [place] on 5/30. Pt's wife is the only driver and the only day she has off is Thursdays. Confirmed understanding.

## 2023-02-02 DIAGNOSIS — E1122 Type 2 diabetes mellitus with diabetic chronic kidney disease: Secondary | ICD-10-CM | POA: Diagnosis not present

## 2023-02-02 DIAGNOSIS — N189 Chronic kidney disease, unspecified: Secondary | ICD-10-CM | POA: Diagnosis not present

## 2023-02-02 DIAGNOSIS — E871 Hypo-osmolality and hyponatremia: Secondary | ICD-10-CM | POA: Diagnosis not present

## 2023-02-02 DIAGNOSIS — Z79899 Other long term (current) drug therapy: Secondary | ICD-10-CM | POA: Diagnosis not present

## 2023-02-09 ENCOUNTER — Ambulatory Visit (INDEPENDENT_AMBULATORY_CARE_PROVIDER_SITE_OTHER)
Admission: RE | Admit: 2023-02-09 | Discharge: 2023-02-09 | Disposition: A | Discharge status: Home / Self Care | Payer: Medicare Other | Source: Ambulatory Visit | Attending: Vascular Surgery | Admitting: Vascular Surgery

## 2023-02-09 ENCOUNTER — Encounter: Payer: Self-pay | Admitting: Physician Assistant

## 2023-02-09 ENCOUNTER — Ambulatory Visit (INDEPENDENT_AMBULATORY_CARE_PROVIDER_SITE_OTHER): Payer: Medicare Other | Admitting: Physician Assistant

## 2023-02-09 ENCOUNTER — Ambulatory Visit (HOSPITAL_COMMUNITY)
Admission: RE | Admit: 2023-02-09 | Discharge: 2023-02-09 | Disposition: A | Discharge status: Home / Self Care | Payer: Medicare Other | Source: Ambulatory Visit | Attending: Vascular Surgery | Admitting: Vascular Surgery

## 2023-02-09 VITALS — BP 172/77 | HR 75 | Temp 98.2°F | Wt 169.0 lb

## 2023-02-09 DIAGNOSIS — I129 Hypertensive chronic kidney disease with stage 1 through stage 4 chronic kidney disease, or unspecified chronic kidney disease: Secondary | ICD-10-CM | POA: Diagnosis not present

## 2023-02-09 DIAGNOSIS — I779 Disorder of arteries and arterioles, unspecified: Secondary | ICD-10-CM | POA: Insufficient documentation

## 2023-02-09 DIAGNOSIS — I6523 Occlusion and stenosis of bilateral carotid arteries: Secondary | ICD-10-CM | POA: Insufficient documentation

## 2023-02-09 DIAGNOSIS — I70213 Atherosclerosis of native arteries of extremities with intermittent claudication, bilateral legs: Secondary | ICD-10-CM | POA: Diagnosis not present

## 2023-02-09 DIAGNOSIS — E1122 Type 2 diabetes mellitus with diabetic chronic kidney disease: Secondary | ICD-10-CM | POA: Diagnosis not present

## 2023-02-09 DIAGNOSIS — E8722 Chronic metabolic acidosis: Secondary | ICD-10-CM | POA: Diagnosis not present

## 2023-02-09 DIAGNOSIS — I739 Peripheral vascular disease, unspecified: Secondary | ICD-10-CM | POA: Diagnosis not present

## 2023-02-09 LAB — VAS US ABI WITH/WO TBI
Left ABI: 0.53
Right ABI: 0.42

## 2023-02-09 MED ORDER — CILOSTAZOL 100 MG PO TABS: ORAL_TABLET | ORAL | 11 refills | Status: DC

## 2023-02-09 NOTE — Progress Notes (Signed)
Office Note     CC:  follow up Requesting Provider:  Juliette Alcide, MD  HPI: Raymond [name] is a 62 y.o. ([birth year]) male who presents for follow up of carotid artery stenosis and PAD. He is status post right TCAR on 08/13/2021 by Dr. Karin Lieu. This was for asymptomatic disease.   He denies any amaurosis or other visual changes, slurred speech, facial drooping,unilateral upper or lower extremity weakness or numbness. He does have some pain in both legs on ambulation about 1 block. This is worse on any uneven ground especially incline. He has no pain on rest. He says he does get restless legs when sitting or at night that is bothersome at times. He has no non healing wounds. He does get swelling mostly in his feet. This is improved upon first waking. He also is taking a fluid pill which helps. He says elevation does not really make a difference. He is compliant with his Pletal, aspirin and statin.  He smokes 1 pack per day   The pt is on a statin for cholesterol management.  The pt is on a daily aspirin.   Other AC: Pletal The pt is on medication for hypertension.   The pt is diabetic.  Tobacco hx:  current everyday smoker  Past Medical History:  Diagnosis Date   Cancer (HCC)    melanoma   Carotid artery occlusion    Cervical spondylosis with myelopathy    Arthritis   CKD (chronic kidney disease)    stage III (07/2021)   COPD (chronic obstructive pulmonary disease) (HCC) 2022   Diabetes mellitus    Type II   History of blood transfusion 08/2021   2 units- had a hematoma after stent to carotid   Hyperlipidemia    Hypertension    Impotence of organic origin    Insomnia    Peripheral arterial disease (HCC)    Restless leg syndrome    Sleep apnea     Past Surgical History:  Procedure Laterality Date   ANTERIOR CERVICAL DECOMP/DISCECTOMY FUSION N/A 07/13/2022   Procedure: Cervical Three-Four Anterior Cervical Discectomy Fusion with Plate removal;  Surgeon: Coletta Memos, MD;   Location: [facility] OR;  Service: Neurosurgery;  Laterality: N/A;  3C/RM 21 BEFORE DR CABBELLS OTHER CASE IN RM 21   APPENDECTOMY     years ago   EYE SURGERY  2017   cataracts   spinal fusion  09/12/1998   SPINE SURGERY  09/12/1998   Cervical Myelopathy- S/P fusion   TRANSCAROTID ARTERY REVASCULARIZATION  Right 08/13/2021   Procedure: RIGHT TRANSCAROTID ARTERY REVASCULARIZATION;  Surgeon: Maeola Harman, MD;  Location[facility];  Service: Vascular;  Laterality: Right;   ULTRASOUND GUIDANCE FOR VASCULAR ACCESS Left 08/13/2021   Procedure: ULTRASOUND GUIDANCE FOR VASCULAR ACCESS;  Surgeon: Maeola Harman, MD;  Location: [facility] OR;  Service: Vascular;  Laterality: Left;    Social History   Socioeconomic History   Marital status: Married    Spouse name: Not on file   Number of children: Not on file   Years of education: Not on file   Highest education level: Not on file  Occupational History   Not on file  Tobacco Use   Smoking status: Every Day    Packs/day: 1.00    Years: 30.00    Additional pack years: 0.00    Total pack years: 30.00    Types: Cigarettes   Smokeless tobacco: Never  Vaping Use   Vaping Use: Never used  Substance  and Sexual Activity   Alcohol use: Not Currently   Drug use: No   Sexual activity: Not on file  Other Topics Concern   Not on file  Social History Narrative   Not on file   Social Determinants of Health   Financial Resource Strain: Not on file  Food Insecurity: Not on file  Transportation Needs: Not on file  Physical Activity: Not on file  Stress: Not on file  Social Connections: Not on file  Intimate Partner Violence: Not on file    Family History  Problem Relation Age of Onset   Cancer Mother    Hypertension Father    Hypertension Sister     Current Outpatient Medications  Medication Sig Dispense Refill   acetaminophen (TYLENOL) 325 MG tablet Take 325 mg by mouth every 6 (six) hours as needed for mild pain (neck).      amLODipine (NORVASC) 10 MG tablet Take 10 mg by mouth daily.     aspirin 81 MG tablet Take 81 mg by mouth daily.     cilostazol (PLETAL) 100 MG tablet TAKE 1 TABLET 2 TIMES A DAY BEFORE A MEAL. 60 tablet 1   doxazosin (CARDURA) 4 MG tablet Take 1 tablet (4 mg total) by mouth daily. 30 tablet 3   famotidine (PEPCID) 20 MG tablet Take 20 mg by mouth daily as needed for heartburn or indigestion.     ferrous sulfate 325 (65 FE) MG EC tablet Take 1 tablet (325 mg total) by mouth daily with breakfast. 30 tablet 0   fluticasone (FLONASE) 50 MCG/ACT nasal spray Place 2 sprays into both nostrils daily as needed for allergies or rhinitis.     linagliptin (TRADJENTA) 5 MG TABS tablet Take 5 mg by mouth daily.     metoprolol succinate (TOPROL-XL) 50 MG 24 hr tablet Take 50 mg by mouth daily.     polyvinyl alcohol (LIQUIFILM TEARS) 1.4 % ophthalmic solution Place 1 drop into both eyes as needed for dry eyes.     Potassium 99 MG TABS Take 99 mg by mouth daily.     rosuvastatin (CRESTOR) 20 MG tablet Take 2 tablets (40 mg total) by mouth daily. (Patient taking differently: Take 20 mg by mouth daily.)     SPIRIVA HANDIHALER 18 MCG inhalation capsule Place 18 mcg into inhaler and inhale daily.     traZODone (DESYREL) 100 MG tablet Take 100 mg by mouth at bedtime.     No current facility-administered medications for this visit.    No Known Allergies   REVIEW OF SYSTEMS:   [X]  denotes positive finding, [ ]  denotes negative finding Cardiac  Comments:  Chest pain or chest pressure:    Shortness of breath upon exertion:    Short of breath when lying flat:    Irregular heart rhythm:        Vascular    Pain in calf, thigh, or hip brought on by ambulation:    Pain in feet at night that wakes you up from your sleep:     Blood clot in your veins:    Leg swelling:         Pulmonary    Oxygen at home:    Productive cough:     Wheezing:         Neurologic    Sudden weakness in arms or legs:     Sudden  numbness in arms or legs:     Sudden onset of difficulty speaking or slurred speech:  Temporary loss of vision in one eye:     Problems with dizziness:         Gastrointestinal    Blood in stool:     Vomited blood:         Genitourinary    Burning when urinating:     Blood in urine:        Psychiatric    Major depression:         Hematologic    Bleeding problems:    Problems with blood clotting too easily:        Skin    Rashes or ulcers:        Constitutional    Fever or chills:      PHYSICAL EXAMINATION:  Vitals:   02/09/23 1352  BP: (!) 172/77  Pulse: 75  Temp: 98.2 F (36.8 C)  TempSrc: Temporal  SpO2: 97%  Weight: 169 lb (76.7 kg)    General:  WDWN in NAD; vital signs documented above Gait: Normal HENT: WNL, normocephalic Pulmonary: normal non-labored breathing , without wheezing Cardiac: regular HR Abdomen: soft, NT Vascular Exam/Pulses: 2+ femoral, no palpable distal pulses. Feet warm and well perfused. Ruborous with mild edema Extremities: without ischemic changes, without Gangrene , without cellulitis; without open wounds;  Musculoskeletal: no muscle wasting or atrophy  Neurologic: A&O X 3 Psychiatric:  The pt has Normal affect.   Non-Invasive Vascular Imaging:   +-------+-----------+-----------+------------+------------+  ABI/TBIToday's ABIToday's TBIPrevious ABIPrevious TBI  +-------+-----------+-----------+------------+------------+  Right 0.42       0.25       0.46        0.24          +-------+-----------+-----------+------------+------------+  Left  0.53       0.37       0.53        0.43          +-------+-----------+-----------+------------+------------+   VAS US Carotid Duplex: Summary:  Right Carotid: Patent stent with. Elevated velocity at the distal stent with no obvious narrowing.  Left Carotid:  Velocities in left ICA consistent with 1-39% stenosis.  Vertebrals:  Bilateral vertebral arteries demonstrate  antegrade flow arteries.   ASSESSMENT/PLAN:: 62 y.o. male here for follow up for carotid artery stenosis and PAD. He is status post right TCAR on 08/13/2021 by Dr. Karin Lieu. This was for asymptomatic disease. He remains without any TIA or stroke like symptoms. Duplex today shows patent right ICA stent. 1-39% left ICA stenosis. Normal flow in the vertebral and subclavian arteries bilaterally. We also follow up for PAD. He has some claudication but this is not lifestyle limiting at this time. He has no rest pain or tissue loss. ABI today is essentially unchanged from 2022.  - encourage walking regiment and development of collaterals - encourage smoking cessation - Continue Aspirin, Pletal and statin - Refill of Pletal 100 mg BID 11 refills sent to his pharmacy - He will follow up in 1 year with repeat ABI and carotid duplex   Graceann Congress, PA-C [facility] [phone]  Clinic MD:   Edilia Bo

## 2023-02-14 ENCOUNTER — Other Ambulatory Visit: Payer: Self-pay

## 2023-02-14 DIAGNOSIS — I779 Disorder of arteries and arterioles, unspecified: Secondary | ICD-10-CM

## 2023-02-14 DIAGNOSIS — I6523 Occlusion and stenosis of bilateral carotid arteries: Secondary | ICD-10-CM

## 2023-02-27 ENCOUNTER — Other Ambulatory Visit: Payer: Self-pay | Admitting: *Deleted

## 2023-02-27 DIAGNOSIS — N189 Chronic kidney disease, unspecified: Secondary | ICD-10-CM

## 2023-02-28 ENCOUNTER — Ambulatory Visit (INDEPENDENT_AMBULATORY_CARE_PROVIDER_SITE_OTHER)
Admission: RE | Admit: 2023-02-28 | Discharge: 2023-02-28 | Disposition: A | Discharge status: Home / Self Care | Payer: Medicare Other | Source: Ambulatory Visit | Attending: Vascular Surgery | Admitting: Vascular Surgery

## 2023-02-28 ENCOUNTER — Ambulatory Visit (HOSPITAL_COMMUNITY)
Admission: RE | Admit: 2023-02-28 | Discharge: 2023-02-28 | Disposition: A | Discharge status: Home / Self Care | Payer: Medicare Other | Source: Ambulatory Visit | Attending: Vascular Surgery | Admitting: Vascular Surgery

## 2023-02-28 DIAGNOSIS — N189 Chronic kidney disease, unspecified: Secondary | ICD-10-CM | POA: Diagnosis not present

## 2023-03-04 ENCOUNTER — Other Ambulatory Visit: Payer: Self-pay | Admitting: Nephrology

## 2023-03-06 ENCOUNTER — Ambulatory Visit (INDEPENDENT_AMBULATORY_CARE_PROVIDER_SITE_OTHER): Payer: Medicare Other | Admitting: Surgery

## 2023-03-06 ENCOUNTER — Encounter: Payer: Self-pay | Admitting: Surgery

## 2023-03-06 VITALS — BP 177/85 | HR 78 | Temp 98.2°F | Resp 20 | Ht 72.0 in | Wt 180.0 lb

## 2023-03-06 DIAGNOSIS — E8722 Chronic metabolic acidosis: Secondary | ICD-10-CM | POA: Diagnosis not present

## 2023-03-06 DIAGNOSIS — N184 Chronic kidney disease, stage 4 (severe): Secondary | ICD-10-CM

## 2023-03-06 DIAGNOSIS — D649 Anemia, unspecified: Secondary | ICD-10-CM | POA: Diagnosis not present

## 2023-03-06 DIAGNOSIS — N189 Chronic kidney disease, unspecified: Secondary | ICD-10-CM | POA: Diagnosis not present

## 2023-03-06 DIAGNOSIS — R808 Other proteinuria: Secondary | ICD-10-CM | POA: Diagnosis not present

## 2023-03-06 DIAGNOSIS — I5032 Chronic diastolic (congestive) heart failure: Secondary | ICD-10-CM | POA: Diagnosis not present

## 2023-03-06 DIAGNOSIS — E1122 Type 2 diabetes mellitus with diabetic chronic kidney disease: Secondary | ICD-10-CM | POA: Diagnosis not present

## 2023-03-06 DIAGNOSIS — I129 Hypertensive chronic kidney disease with stage 1 through stage 4 chronic kidney disease, or unspecified chronic kidney disease: Secondary | ICD-10-CM | POA: Diagnosis not present

## 2023-03-06 NOTE — Progress Notes (Signed)
 [facility] [place]  Patient name: Raymond [name] MRN: [id number] DOB: 09/11/1961 Sex: male   REASON FOR VISIT:    DIALYSIS ACCESS  HISOTRY OF PRESENT ILLNESS:    Raymond [name] is a 62 y.o. male who is followed in our office for carotid disease, status post TCAR on 08/13/2021.  He is also managed for lower extremity edema.  He is here today for discussions regarding dialysis access.  His renal failure secondary to hypertension and diabetes.  He does take a statin for hypercholesterolemia.  He is a current smoker.  He is on Pletal for claudication  He is right-handed.  He does not have a pacemaker or defibrillator.  PAST MEDICAL HISTORY:   Past Medical History:  Diagnosis Date   Cancer (HCC)    melanoma   Carotid artery occlusion    Cervical spondylosis with myelopathy    Arthritis   CKD (chronic kidney disease)    stage III (07/2021)   COPD (chronic obstructive pulmonary disease) (HCC) 2022   Diabetes mellitus    Type II   History of blood transfusion 08/2021   2 units- had a hematoma after stent to carotid   Hyperlipidemia    Hypertension    Impotence of organic origin    Insomnia    Peripheral arterial disease (HCC)    Restless leg syndrome    Sleep apnea      FAMILY HISTORY:   Family History  Problem Relation Age of Onset   Cancer Mother    Hypertension Father    Hypertension Sister     SOCIAL HISTORY:   Social History   Tobacco Use   Smoking status: Every Day    Packs/day: 1.00    Years: 30.00    Additional pack years: 0.00    Total pack years: 30.00    Types: Cigarettes   Smokeless tobacco: Never  Substance Use Topics   Alcohol use: Not Currently     ALLERGIES:   No Known Allergies   CURRENT MEDICATIONS:   Current Outpatient Medications  Medication Sig Dispense Refill   acetaminophen (TYLENOL) 325 MG tablet Take 325 mg by mouth every 6 (six) hours as needed for mild pain  (neck).     amLODipine (NORVASC) 10 MG tablet Take 10 mg by mouth daily.     aspirin 81 MG tablet Take 81 mg by mouth daily.     cilostazol (PLETAL) 100 MG tablet TAKE 1 TABLET 2 TIMES A DAY BEFORE A MEAL. 60 tablet 11   doxazosin (CARDURA) 4 MG tablet Take 1 tablet (4 mg total) by mouth daily. 30 tablet 3   famotidine (PEPCID) 20 MG tablet Take 20 mg by mouth daily as needed for heartburn or indigestion.     ferrous sulfate 325 (65 FE) MG EC tablet Take 1 tablet (325 mg total) by mouth daily with breakfast. 30 tablet 0   fluticasone (FLONASE) 50 MCG/ACT nasal spray Place 2 sprays into both nostrils daily as needed for allergies or rhinitis.     linagliptin (TRADJENTA) 5 MG TABS tablet Take 5 mg by mouth daily.     metoprolol succinate (TOPROL-XL) 50 MG 24 hr tablet Take 50 mg by mouth daily.     polyvinyl alcohol (LIQUIFILM TEARS) 1.4 % ophthalmic solution Place 1 drop into both eyes as needed for dry eyes.     Potassium 99 MG TABS Take 99 mg by mouth daily.     rosuvastatin (CRESTOR) 20 MG tablet Take   2 tablets (40 mg total) by mouth daily. (Patient taking differently: Take 20 mg by mouth daily.)     SPIRIVA HANDIHALER 18 MCG inhalation capsule Place 18 mcg into inhaler and inhale daily.     traZODone (DESYREL) 100 MG tablet Take 100 mg by mouth at bedtime.     No current facility-administered medications for this visit.    REVIEW OF SYSTEMS:   [X] denotes positive finding, [ ] denotes negative finding Cardiac  Comments:  Chest pain or chest pressure:    Shortness of breath upon exertion:    Short of breath when lying flat:    Irregular heart rhythm:        Vascular    Pain in calf, thigh, or hip brought on by ambulation:    Pain in feet at night that wakes you up from your sleep:     Blood clot in your veins:    Leg swelling:         Pulmonary    Oxygen at home:    Productive cough:     Wheezing:         Neurologic    Sudden weakness in arms or legs:     Sudden numbness in  arms or legs:     Sudden onset of difficulty speaking or slurred speech:    Temporary loss of vision in one eye:     Problems with dizziness:         Gastrointestinal    Blood in stool:     Vomited blood:         Genitourinary    Burning when urinating:     Blood in urine:        Psychiatric    Major depression:         Hematologic    Bleeding problems:    Problems with blood clotting too easily:        Skin    Rashes or ulcers:        Constitutional    Fever or chills:      PHYSICAL EXAM:   Vitals:   03/06/23 1010  BP: (!) 177/85  Pulse: 78  Resp: 20  Temp: 98.2 F (36.8 C)  SpO2: 96%  Weight: 180 lb (81.6 kg)  Height: 6' (1.829 m)    GENERAL: The patient is a well-nourished male, in no acute distress. The vital signs are documented above. CARDIAC: There is a regular rate and rhythm.  VASCULAR: Palpable left radial pulse PULMONARY: Non-labored respirations ABDOMEN: Soft and non-tender with normal pitched bowel sounds.  MUSCULOSKELETAL: There are no major deformities or cyanosis. NEUROLOGIC: No focal weakness or paresthesias are detected. SKIN: There are no ulcers or rashes noted. PSYCHIATRIC: The patient has a normal affect.  STUDIES:   I have reviewed the following ultrasound studies: Right Cephalic   Diameter (cm)Depth (cm)   Findings     +-----------------+-------------+----------+--------------+  Shoulder                               not visualized  +-----------------+-------------+----------+--------------+  Prox upper arm                          not visualized  +-----------------+-------------+----------+--------------+  Mid upper arm                           not visualized  +-----------------+-------------+----------+--------------+    Dist upper arm                          not visualized  +-----------------+-------------+----------+--------------+  Antecubital fossa    0.36                 to basilic     +-----------------+-------------+----------+--------------+  Prox forearm         0.20                               +-----------------+-------------+----------+--------------+  Mid forearm          0.25                               +-----------------+-------------+----------+--------------+  Dist forearm         0.19                  lateral      +-----------------+-------------+----------+--------------+   +-----------------+-------------+----------+--------+  Right Basilic    Diameter (cm)Depth (cm)Findings  +-----------------+-------------+----------+--------+  Mid upper arm        0.33                         +-----------------+-------------+----------+--------+  Dist upper arm       0.44                         +-----------------+-------------+----------+--------+  Antecubital fossa    0.32                         +-----------------+-------------+----------+--------+   +-----------------+-------------+----------+--------------+  Left Cephalic    Diameter (cm)Depth (cm)   Findings     +-----------------+-------------+----------+--------------+  Shoulder                               not visualized  +-----------------+-------------+----------+--------------+  Prox upper arm                          not visualized  +-----------------+-------------+----------+--------------+  Mid upper arm                           not visualized  +-----------------+-------------+----------+--------------+  Dist upper arm                          not visualized  +-----------------+-------------+----------+--------------+  Antecubital fossa    0.46                 to basilic    +-----------------+-------------+----------+--------------+  Prox forearm         0.22                               +-----------------+-------------+----------+--------------+  Mid forearm          0.27                                +-----------------+-------------+----------+--------------+  Dist forearm         0.23                    lateral      +-----------------+-------------+----------+--------------+   +-----------------+-------------+----------+--------+  Left Basilic     Diameter (cm)Depth (cm)Findings  +-----------------+-------------+----------+--------+  Prox upper arm       0.87                         +-----------------+-------------+----------+--------+  Mid upper arm        0.54                         +-----------------+-------------+----------+--------+  Dist upper arm       0.43                         +-----------------+-------------+----------+--------+  Antecubital fossa    0.36                         +-----------------+-------------+----------+--------+    Right: Patent brachial, radial, and ulnar arteries.  Left: Patent brachial, radial, and ulnar arteries.   MEDICAL ISSUES:   CKD4: I discussed with the patient that most likely he would be a candidate for a left basilic vein fistula.  I discussed doing this in stages.  We discussed the risk of not maturity, the need for future interventions, and the risk for steal syndrome.  All of his questions were answered today.  I will stop his Plavix prior to his procedure.  Will schedule him for left arm fistula within the next few weeks.  All questions were answered.    Wells Jwan Hornbaker, IV, MD, FACS [facility] [place] Tel [phone] Pager [phone]  

## 2023-03-06 NOTE — H&P (View-Only) (Signed)
[facility] [place]  Patient name: Raymond [name] MRN: [id number] DOB: June 09, 1961 Sex: male   REASON FOR VISIT:    DIALYSIS ACCESS  HISOTRY OF PRESENT ILLNESS:    Raymond [name] is a 62 y.o. male who is followed in our office for carotid disease, status post TCAR on 08/13/2021.  He is also managed for lower extremity edema.  He is here today for discussions regarding dialysis access.  His renal failure secondary to hypertension and diabetes.  He does take a statin for hypercholesterolemia.  He is a current smoker.  He is on Pletal for claudication  He is right-handed.  He does not have a pacemaker or defibrillator.  PAST MEDICAL HISTORY:   Past Medical History:  Diagnosis Date   Cancer (HCC)    melanoma   Carotid artery occlusion    Cervical spondylosis with myelopathy    Arthritis   CKD (chronic kidney disease)    stage III (07/2021)   COPD (chronic obstructive pulmonary disease) (HCC) 2022   Diabetes mellitus    Type II   History of blood transfusion 08/2021   2 units- had a hematoma after stent to carotid   Hyperlipidemia    Hypertension    Impotence of organic origin    Insomnia    Peripheral arterial disease (HCC)    Restless leg syndrome    Sleep apnea      FAMILY HISTORY:   Family History  Problem Relation Age of Onset   Cancer Mother    Hypertension Father    Hypertension Sister     SOCIAL HISTORY:   Social History   Tobacco Use   Smoking status: Every Day    Packs/day: 1.00    Years: 30.00    Additional pack years: 0.00    Total pack years: 30.00    Types: Cigarettes   Smokeless tobacco: Never  Substance Use Topics   Alcohol use: Not Currently     ALLERGIES:   No Known Allergies   CURRENT MEDICATIONS:   Current Outpatient Medications  Medication Sig Dispense Refill   acetaminophen (TYLENOL) 325 MG tablet Take 325 mg by mouth every 6 (six) hours as needed for mild pain  (neck).     amLODipine (NORVASC) 10 MG tablet Take 10 mg by mouth daily.     aspirin 81 MG tablet Take 81 mg by mouth daily.     cilostazol (PLETAL) 100 MG tablet TAKE 1 TABLET 2 TIMES A DAY BEFORE A MEAL. 60 tablet 11   doxazosin (CARDURA) 4 MG tablet Take 1 tablet (4 mg total) by mouth daily. 30 tablet 3   famotidine (PEPCID) 20 MG tablet Take 20 mg by mouth daily as needed for heartburn or indigestion.     ferrous sulfate 325 (65 FE) MG EC tablet Take 1 tablet (325 mg total) by mouth daily with breakfast. 30 tablet 0   fluticasone (FLONASE) 50 MCG/ACT nasal spray Place 2 sprays into both nostrils daily as needed for allergies or rhinitis.     linagliptin (TRADJENTA) 5 MG TABS tablet Take 5 mg by mouth daily.     metoprolol succinate (TOPROL-XL) 50 MG 24 hr tablet Take 50 mg by mouth daily.     polyvinyl alcohol (LIQUIFILM TEARS) 1.4 % ophthalmic solution Place 1 drop into both eyes as needed for dry eyes.     Potassium 99 MG TABS Take 99 mg by mouth daily.     rosuvastatin (CRESTOR) 20 MG tablet Take  2 tablets (40 mg total) by mouth daily. (Patient taking differently: Take 20 mg by mouth daily.)     SPIRIVA HANDIHALER 18 MCG inhalation capsule Place 18 mcg into inhaler and inhale daily.     traZODone (DESYREL) 100 MG tablet Take 100 mg by mouth at bedtime.     No current facility-administered medications for this visit.    REVIEW OF SYSTEMS:   [X]  denotes positive finding, [ ]  denotes negative finding Cardiac  Comments:  Chest pain or chest pressure:    Shortness of breath upon exertion:    Short of breath when lying flat:    Irregular heart rhythm:        Vascular    Pain in calf, thigh, or hip brought on by ambulation:    Pain in feet at night that wakes you up from your sleep:     Blood clot in your veins:    Leg swelling:         Pulmonary    Oxygen at home:    Productive cough:     Wheezing:         Neurologic    Sudden weakness in arms or legs:     Sudden numbness in  arms or legs:     Sudden onset of difficulty speaking or slurred speech:    Temporary loss of vision in one eye:     Problems with dizziness:         Gastrointestinal    Blood in stool:     Vomited blood:         Genitourinary    Burning when urinating:     Blood in urine:        Psychiatric    Major depression:         Hematologic    Bleeding problems:    Problems with blood clotting too easily:        Skin    Rashes or ulcers:        Constitutional    Fever or chills:      PHYSICAL EXAM:   Vitals:   03/06/23 1010  BP: (!) 177/85  Pulse: 78  Resp: 20  Temp: 98.2 F (36.8 C)  SpO2: 96%  Weight: 180 lb (81.6 kg)  Height: 6' (1.829 m)    GENERAL: The patient is a well-nourished male, in no acute distress. The vital signs are documented above. CARDIAC: There is a regular rate and rhythm.  VASCULAR: Palpable left radial pulse PULMONARY: Non-labored respirations ABDOMEN: Soft and non-tender with normal pitched bowel sounds.  MUSCULOSKELETAL: There are no major deformities or cyanosis. NEUROLOGIC: No focal weakness or paresthesias are detected. SKIN: There are no ulcers or rashes noted. PSYCHIATRIC: The patient has a normal affect.  STUDIES:   I have reviewed the following ultrasound studies: Right Cephalic   Diameter (cm)Depth (cm)   Findings     +-----------------+-------------+----------+--------------+  Shoulder                               not visualized  +-----------------+-------------+----------+--------------+  Prox upper arm                          not visualized  +-----------------+-------------+----------+--------------+  Mid upper arm                           not visualized  +-----------------+-------------+----------+--------------+  Dist upper arm                          not visualized  +-----------------+-------------+----------+--------------+  Antecubital fossa    0.36                 to basilic     +-----------------+-------------+----------+--------------+  Prox forearm         0.20                               +-----------------+-------------+----------+--------------+  Mid forearm          0.25                               +-----------------+-------------+----------+--------------+  Dist forearm         0.19                  lateral      +-----------------+-------------+----------+--------------+   +-----------------+-------------+----------+--------+  Right Basilic    Diameter (cm)Depth (cm)Findings  +-----------------+-------------+----------+--------+  Mid upper arm        0.33                         +-----------------+-------------+----------+--------+  Dist upper arm       0.44                         +-----------------+-------------+----------+--------+  Antecubital fossa    0.32                         +-----------------+-------------+----------+--------+   +-----------------+-------------+----------+--------------+  Left Cephalic    Diameter (cm)Depth (cm)   Findings     +-----------------+-------------+----------+--------------+  Shoulder                               not visualized  +-----------------+-------------+----------+--------------+  Prox upper arm                          not visualized  +-----------------+-------------+----------+--------------+  Mid upper arm                           not visualized  +-----------------+-------------+----------+--------------+  Dist upper arm                          not visualized  +-----------------+-------------+----------+--------------+  Antecubital fossa    0.46                 to basilic    +-----------------+-------------+----------+--------------+  Prox forearm         0.22                               +-----------------+-------------+----------+--------------+  Mid forearm          0.27                                +-----------------+-------------+----------+--------------+  Dist forearm         0.23  lateral      +-----------------+-------------+----------+--------------+   +-----------------+-------------+----------+--------+  Left Basilic     Diameter (cm)Depth (cm)Findings  +-----------------+-------------+----------+--------+  Prox upper arm       0.87                         +-----------------+-------------+----------+--------+  Mid upper arm        0.54                         +-----------------+-------------+----------+--------+  Dist upper arm       0.43                         +-----------------+-------------+----------+--------+  Antecubital fossa    0.36                         +-----------------+-------------+----------+--------+    Right: Patent brachial, radial, and ulnar arteries.  Left: Patent brachial, radial, and ulnar arteries.   MEDICAL ISSUES:   CKD4: I discussed with the patient that most likely he would be a candidate for a left basilic vein fistula.  I discussed doing this in stages.  We discussed the risk of not maturity, the need for future interventions, and the risk for steal syndrome.  All of his questions were answered today.  I will stop his Plavix prior to his procedure.  Will schedule him for left arm fistula within the next few weeks.  All questions were answered.    Charlena Cross, MD, FACS [facility] [phone] Pager [phone]

## 2023-03-08 ENCOUNTER — Other Ambulatory Visit: Payer: Self-pay

## 2023-03-08 DIAGNOSIS — N184 Chronic kidney disease, stage 4 (severe): Secondary | ICD-10-CM

## 2023-03-13 ENCOUNTER — Encounter (HOSPITAL_COMMUNITY): Payer: Self-pay | Admitting: Surgery

## 2023-03-13 ENCOUNTER — Telehealth: Payer: Self-pay

## 2023-03-13 ENCOUNTER — Other Ambulatory Visit: Payer: Self-pay

## 2023-03-13 NOTE — Pre-Procedure Instructions (Signed)
Spoke with wife per DPR  PCP - Raymond Alcide, MD  EKG - DOS ECHO - 07/08/21  CPAP - Does not tolerate  Fasting Blood Sugar - Wife is unsure Checks Blood Sugar 1-2 week  Pletal last dose:03/12/23 ASA: Continue  Requesting recently drawn labs by Dr. Wolfgang Phoenix with [facility]  Anesthesia review: Y  Patient verbally denies any shortness of breath, fever, cough and chest pain during phone call   -------------  SDW INSTRUCTIONS given:  Your procedure is scheduled on Wednesday, July 3rd.  Report to [facility] Main Entrance "A" at 0630 A.M., and check in at the Admitting office.  Call this number if you have problems the morning of surgery:  [phone]   Remember:  Do not eat or drink after midnight the night before your surgery    Take these medicines the morning of surgery with A SIP OF WATER  amLODipine (NORVASC)  aspirin  doxazosin (CARDURA)  fluticasone (FLONASE)  metoprolol succinate (TOPROL-XL)  minoxidil (LONITEN)  SPIRIVA  acetaminophen (TYLENOL)-if needed famotidine (PEPCID)-if needed LIQUIFILM TEARS-if needed  As of today, STOP taking any Aspirin (unless otherwise instructed by your surgeon) Aleve, Naproxen, Ibuprofen, Motrin, Advil, Goody's, BC's, all herbal medications, fish oil, and all vitamins.   ** PLEASE check your blood sugar the morning of your surgery when you wake up and every 2 hours until you get to the Short Stay unit.  If your blood sugar is less than 70 mg/dL, you will need to treat for low blood sugar: Do not take insulin. Treat a low blood sugar (less than 70 mg/dL) with  cup of clear juice (cranberry or apple), 4 glucose tablets, OR glucose gel. Recheck blood sugar in 15 minutes after treatment (to make sure it is greater than 70 mg/dL). If your blood sugar is not greater than 70 mg/dL on recheck, call [phone] for further instructions.                     Do not wear jewelry, make up, or nail polish            Do not  wear lotions, powders, perfumes/colognes, or deodorant.            Do not shave 48 hours prior to surgery.  Men may shave face and neck.            Do not bring valuables to the hospital.            [facility] is not responsible for any belongings or valuables.  Do NOT Smoke (Tobacco/Vaping) 24 hours prior to your procedure If you use a CPAP at night, you may bring all equipment for your overnight stay.   Contacts, glasses, dentures or bridgework may not be worn into surgery.      For patients admitted to the hospital, discharge time will be determined by your treatment team.   Patients discharged the day of surgery will not be allowed to drive home, and someone needs to stay with them for 24 hours.    Special instructions:   [facility]- Preparing For Surgery  Before surgery, you can play an important role. Because skin is not sterile, your skin needs to be as free of germs as possible. You can reduce the number of germs on your skin by washing with CHG (chlorahexidine gluconate) Soap before surgery.  CHG is an antiseptic cleaner which kills germs and bonds with the skin to continue killing germs even after washing.  Oral Hygiene is also important to reduce your risk of infection.  Remember - BRUSH YOUR TEETH THE MORNING OF SURGERY WITH YOUR REGULAR TOOTHPASTE  Please do not use if you have an allergy to CHG or antibacterial soaps. If your skin becomes reddened/irritated stop using the CHG.  Do not shave (including legs and underarms) for at least 48 hours prior to first CHG shower. It is OK to shave your face.  Please follow these instructions carefully.   Shower the NIGHT BEFORE SURGERY and the MORNING OF SURGERY with DIAL Soap.   Pat yourself dry with a CLEAN TOWEL.  Wear CLEAN PAJAMAS to bed the night before surgery  Place CLEAN SHEETS on your bed the night of your first shower and DO NOT SLEEP WITH PETS.   Day of Surgery: Please shower morning of surgery  Wear  Clean/Comfortable clothing the morning of surgery Do not apply any deodorants/lotions.   Remember to brush your teeth WITH YOUR REGULAR TOOTHPASTE.   Questions were answered. Patient verbalized understanding of instructions.

## 2023-03-13 NOTE — Progress Notes (Signed)
Anesthesia Chart Review: Raymond [name]  Case: [id number] Date/Time: 03/15/23 0815   Procedure: LEFT ARM FISTULA CREATION (Left)   Anesthesia type: Choice   Pre-op diagnosis: CKD IV   [facility] ROOM 11 / [facility]   Surgeons: Nada Libman, MD       DISCUSSION: Patient is a 62 year old male scheduled for the above procedure. He is s/p C3-4 ACDF 07/13/22 for cervical myelopathy.   History includes smoking, HTN, HLD, DM2, PAD, carotid artery disease (s/p right TCAR 08/13/21), CKD (Stage IV), OSA, COPD, RLS, skin cancer (melanoma), spinal surgery  (s/p cervical fusion 2000; C3-4 ACDF 07/13/22), COVID-19 (09/2020).   He had cardiology evaluation in 2022 prior to undergoing right TCAR. He saw Dr. Jacques Navy on 07/08/21 for a preoperative evaluation. Echo that day showed LVEF 50-55% with mild anterior hypokinesis. He subsequently had a stress test on 07/13/21 that was non-ischemic, but Dr. Jacques Navy considered "intermediate risk"--no ischemia on stress images but probable small area of anterior wall infarct at the apex and mid ventricle consistent with his wall motion abnormality. She recommended HTN optimization, and added, "The patient is overall high risk for intermediate risk procedure. Factors contributing to risk include uncontrolled hypertension, baseline renal dysfunction, current smoking, poor functional capacity, and probable prior anterior infarct. However, no ischemia on stress test, therefore no further cardiovascular testing is required prior to the procedure..." He has not been seen by cardiology since his stress test. He did undergo right TCAR on 08/13/21.    CKD is followed by nephrologist Dr. Wolfgang Phoenix on 11/03/22. Hydralazine started for HTN. Now referred to vascular surgery for hemodialysis access. Currently medications include amlodipine 5 mg daily, doxazosin 4 mg daily, Lasix 20 mg BID, Toprol XL 50 mg daily, minoxidil 10 mg BID.  Per Dr. Myra Gianotti, hold Pletal starting 03/13/23.   He is for  labs on arrival. Labs as of 12/22/22 [facility] Everywhere) showed H/H 13.4/39.7, PLT 184, WBC 8.4, glucose 181, BUN 33, Cr 3.96, eGFR 16, Na 134, K 4.0, albumin 3.9, phosphorus 5.1, PTH 41. A1c 7.1% on 03/28/22. He is on linagliptin.   He is a same day work-up, so he is for updated labs and EKG as indicated on arrival. Anesthesia team to evaluate on the day of surgery.    VS:  BP Readings from Last 3 Encounters:  03/06/23 (!) 177/85  02/09/23 (!) 172/77  07/13/22 (!) 158/59   Pulse Readings from Last 3 Encounters:  03/06/23 78  02/09/23 75  07/13/22 72     PROVIDERS: Juliette Alcide, MD is PCP ([facility]) Weston Brass, MD is cardiologist. Also saw Phillips Hay, RPH-CPP on 08/09/21  in the HTN Clinic. Celso Amy, MD is nephrologist Lemar Livings, MD is vascular surgeon. Last follow-up with APP 02/09/23 with Carotid US. One year follow-up planned. Norwood Levo, MD is GI Windell Norfolk, MD is neurologist    LABS: See DISCUSSION. For day of surgery.   IMAGES: US Renal 04/07/22: Summary:  Largest Aortic Diameter: 2.5 cm  Renal:  - Right: Normal size right kidney. Abnormal right Resistive Index. Normal cortical thickness of right kidney. RRV flow present. 1-59% stenosis of the right renal artery.  - Left:  Normal size of left kidney. Abnormal left Resisitve Index. Normal cortical thickness of the left kidney. LRV flow present. 1-59% stenosis of the left renal artery.  Mesenteric:  Normal Celiac artery and Superior Mesenteric artery findings.    CXR 01/16/22 [facility]): FINDINGS:  The heart size  and mediastinal contours are within normal limits.  Both lungs are clear. The visualized skeletal structures are  unremarkable. IMPRESSION: No active cardiopulmonary disease.      EKG: Last EKG is > 68 year old.    CV: US Carotid 02/09/23: Summary:  - Right Carotid: Patent stent with. Elevated velocity at the distal stent with no obvious  narrowing.  - Left Carotid:  Velocities in left ICA consistent with 1-39% stenosis.  - Vertebrals:  Bilateral vertebral arteries demonstrate antegrade flow.  - Subclavians: Normal flow hemodynamics were seen in bilateral subclavian arteries.   Nuclear stress test 07/13/21: Findings: Negative for stress induced arrhythmias, baseline anterior infarct pattern on ECG. There is an true apical perfusion defect in rest and stress without wall motion abnormality more consistent with apical thinning.   This is less consistent with infarct.   Conclusions: Stress test is negative for ischemia.   Findings are consistent with ischemia. The study is low risk.   No ST deviation was noted.   Left ventricular function is normal. Nuclear stress EF: 61 %. The left ventricular ejection fraction is normal (55-65%). End diastolic cavity size is normal.   Prior study not available for comparison. Reviewed by Dr. Mitzi Hansen wrote, "No ischemia on stress test images. Probable small area of anterior wall infarct at the apex and mid ventricle which is consistent with known wall motion abnormality on echo and ECG. Stress test is intermediate risk. I have independently reviewed the images. I would start the patient on doxazosin 4 mg daily and refer to CVRR HTN clinic for further optimization in 2 weeks.   The patient is overall high risk for intermediate risk procedure. Factors contributing to risk include uncontrolled hypertension, baseline renal dysfunction, current smoking, poor functional capacity, and probable prior anterior infarct. However, no ischemia on stress test, therefore no further cardiovascular testing is required prior to the procedure. We will attempt to optimize blood pressure in the perioperative period, however suspect risk level is largely non-modifiable prior to surgery. Risk level should be discussed between patient and surgical team prior to proceeding."     Echo 07/08/21: IMPRESSIONS   1. Left  ventricular ejection fraction, by estimation, is 50 to 55%. The left ventricle has low normal function. The left ventricle demonstrates regional wall motion abnormalities (see scoring diagram/findings for description). There is mild concentric left ventricular hypertrophy. Left ventricular diastolic parameters were normal. There is mild hypokinesis of the left ventricular, basal-mid anterior wall.   2. Right ventricular systolic function is normal. The right ventricular size is normal.   3. The mitral valve is normal in structure. Trivial mitral valve regurgitation. No evidence of mitral stenosis.   4. The aortic valve is tricuspid. Aortic valve regurgitation is not visualized. No aortic stenosis is present.   5. The inferior vena cava is normal in size with greater than 50% respiratory variability, suggesting right atrial pressure of 3 mmHg.  - Comparison(s): Prior images unable to be directly viewed, comparison made by report only. Changes from prior study are noted.  - Conclusion(s)/Recommendation(s): Low normal EF, but there is a focal wall motion abnormalities in the basal to mid anterior wall. No prior images available but this is not noted on prior report.       Past Medical History:  Diagnosis Date   Cancer (HCC)    melanoma   Carotid artery occlusion    Cervical spondylosis with myelopathy    Arthritis   CKD (chronic kidney disease)  stage III (07/2021)   COPD (chronic obstructive pulmonary disease) (HCC) 2022   Diabetes mellitus    Type II   History of blood transfusion 08/2021   2 units- had a hematoma after stent to carotid   Hyperlipidemia    Hypertension    Impotence of organic origin    Insomnia    Peripheral arterial disease (HCC)    Restless leg syndrome    Sleep apnea     Past Surgical History:  Procedure Laterality Date   ANTERIOR CERVICAL DECOMP/DISCECTOMY FUSION N/A 07/13/2022   Procedure: Cervical Three-Four Anterior Cervical Discectomy Fusion with Plate  removal;  Surgeon: Coletta Memos, MD;  Location: [facility];  Service: Neurosurgery;  Laterality: N/A;  3C/RM 21 BEFORE DR CABBELLS OTHER CASE IN RM 21   APPENDECTOMY     years ago   EYE SURGERY  2017   cataracts   spinal fusion  09/12/1998   SPINE SURGERY  09/12/1998   Cervical Myelopathy- S/P fusion   TRANSCAROTID ARTERY REVASCULARIZATION  Right 08/13/2021   Procedure: RIGHT TRANSCAROTID ARTERY REVASCULARIZATION;  Surgeon: Maeola Harman, MD;  Location: [facility] OR;  Service: Vascular;  Laterality: Right;   ULTRASOUND GUIDANCE FOR VASCULAR ACCESS Left 08/13/2021   Procedure: ULTRASOUND GUIDANCE FOR VASCULAR ACCESS;  Surgeon: Maeola Harman, MD;  Location: [facility] OR;  Service: Vascular;  Laterality: Left;    MEDICATIONS: No current facility-administered medications for this encounter.    acetaminophen (TYLENOL) 500 MG tablet   amLODipine (NORVASC) 10 MG tablet   aspirin 81 MG tablet   cilostazol (PLETAL) 100 MG tablet   doxazosin (CARDURA) 4 MG tablet   famotidine (PEPCID) 20 MG tablet   ferrous sulfate 325 (65 FE) MG EC tablet   fluticasone (FLONASE) 50 MCG/ACT nasal spray   furosemide (LASIX) 20 MG tablet   linagliptin (TRADJENTA) 5 MG TABS tablet   metoprolol succinate (TOPROL-XL) 50 MG 24 hr tablet   minoxidil (LONITEN) 10 MG tablet   polyvinyl alcohol (LIQUIFILM TEARS) 1.4 % ophthalmic solution   Potassium 99 MG TABS   rosuvastatin (CRESTOR) 20 MG tablet   SPIRIVA HANDIHALER 18 MCG inhalation capsule   traZODone (DESYREL) 100 MG tablet    Shonna Chock, PA-C Surgical Short Stay/Anesthesiology [facility] Phone [phone] [facility] Phone [phone] 03/13/2023 11:59 AM

## 2023-03-13 NOTE — Anesthesia Preprocedure Evaluation (Addendum)
Anesthesia Evaluation  Patient identified by MRN, date of birth, ID band Patient awake    Reviewed: Allergy & Precautions, NPO status , Patient's Chart, lab work & pertinent test results, reviewed documented beta blocker date and time   History of Anesthesia Complications Negative for: history of anesthetic complications  Airway Mallampati: II  TM Distance: >3 FB Neck ROM: Full    Dental no notable dental hx.    Pulmonary sleep apnea , COPD, Current Smoker   Pulmonary exam normal        Cardiovascular hypertension, Pt. on medications and Pt. on home beta blockers + Peripheral Vascular Disease  Normal cardiovascular exam  Echo 07/08/21: EF 50-55%, mild LVH, mild hypokinesis of LV basal-mid anterior wall, valves ok     Neuro/Psych RLS, s/p C3-4 ACDF 07/13/22    GI/Hepatic Neg liver ROS,GERD  Medicated,,  Endo/Other  diabetes, Type 2, Oral Hypoglycemic Agents    Renal/GU Renal Insufficiency and CRFRenal disease  negative genitourinary   Musculoskeletal  (+) Arthritis ,    Abdominal   Peds  Hematology   Anesthesia Other Findings Day of surgery medications reviewed with patient.  Reproductive/Obstetrics negative OB ROS                              Anesthesia Physical Anesthesia Plan  ASA: 4  Anesthesia Plan: MAC and Regional   Post-op Pain Management: Tylenol PO (pre-op)*   Induction:   PONV Risk Score and Plan: 0 and Ondansetron, Propofol infusion, Treatment may vary due to age or medical condition and Midazolam  Airway Management Planned: Simple Face Mask and Natural Airway  Additional Equipment: None  Intra-op Plan:   Post-operative Plan:   Informed Consent: I have reviewed the patients History and Physical, chart, labs and discussed the procedure including the risks, benefits and alternatives for the proposed anesthesia with the patient or authorized representative who has  indicated his/her understanding and acceptance.       Plan Discussed with: CRNA  Anesthesia Plan Comments: (PAT note written 03/13/2023 by Shonna Chock, PA-C.  )        Anesthesia Quick Evaluation

## 2023-03-13 NOTE — Telephone Encounter (Signed)
Spoke with patient wife Dois Davenport. Informed her of provider recommendations. She reports patient has not taken Pletal today and verbalized understanding to start holding medication starting today.

## 2023-03-13 NOTE — Telephone Encounter (Signed)
-----   Message from Nada Libman, MD sent at 03/11/2023  9:33 PM EDT ----- Regarding: RE: Please advise I would hold pletal starting Monday ----- Message ----- From: Primitivo Gauze, RN Sent: 03/08/2023   5:03 PM EDT To: Nada Libman, MD Subject: Please advise                                  WB  I scheduled patient for his fistula creation on 7/3 and noted you wrote to stop Plavix. After talking to pt wife and reviewing med list, he does not take Plavix but does take Pletal. I have this as a NO hold on my list of meds for OR/PV lab but wanted to clarify if this is what you want him to hold. Please advise.   Thanks Yahoo

## 2023-03-15 ENCOUNTER — Ambulatory Visit (HOSPITAL_COMMUNITY): Payer: Medicare Other | Admitting: Vascular Surgery

## 2023-03-15 ENCOUNTER — Encounter (HOSPITAL_COMMUNITY): Payer: Self-pay | Admitting: Surgery

## 2023-03-15 ENCOUNTER — Encounter (HOSPITAL_COMMUNITY)
Admission: RE | Disposition: A | Discharge status: Home / Self Care | Payer: Self-pay | Source: Home / Self Care | Attending: Surgery

## 2023-03-15 ENCOUNTER — Other Ambulatory Visit: Payer: Self-pay

## 2023-03-15 ENCOUNTER — Ambulatory Visit (HOSPITAL_COMMUNITY)
Admission: RE | Admit: 2023-03-15 | Discharge: 2023-03-15 | Disposition: A | Discharge status: Home / Self Care | Payer: Medicare Other | Attending: Surgery | Admitting: Surgery

## 2023-03-15 DIAGNOSIS — N185 Chronic kidney disease, stage 5: Secondary | ICD-10-CM

## 2023-03-15 DIAGNOSIS — E785 Hyperlipidemia, unspecified: Secondary | ICD-10-CM | POA: Diagnosis not present

## 2023-03-15 DIAGNOSIS — Z7984 Long term (current) use of oral hypoglycemic drugs: Secondary | ICD-10-CM | POA: Diagnosis not present

## 2023-03-15 DIAGNOSIS — I129 Hypertensive chronic kidney disease with stage 1 through stage 4 chronic kidney disease, or unspecified chronic kidney disease: Secondary | ICD-10-CM | POA: Insufficient documentation

## 2023-03-15 DIAGNOSIS — G2581 Restless legs syndrome: Secondary | ICD-10-CM | POA: Insufficient documentation

## 2023-03-15 DIAGNOSIS — E1122 Type 2 diabetes mellitus with diabetic chronic kidney disease: Secondary | ICD-10-CM | POA: Diagnosis not present

## 2023-03-15 DIAGNOSIS — F17210 Nicotine dependence, cigarettes, uncomplicated: Secondary | ICD-10-CM

## 2023-03-15 DIAGNOSIS — J449 Chronic obstructive pulmonary disease, unspecified: Secondary | ICD-10-CM | POA: Diagnosis not present

## 2023-03-15 DIAGNOSIS — E1151 Type 2 diabetes mellitus with diabetic peripheral angiopathy without gangrene: Secondary | ICD-10-CM | POA: Insufficient documentation

## 2023-03-15 DIAGNOSIS — K219 Gastro-esophageal reflux disease without esophagitis: Secondary | ICD-10-CM | POA: Diagnosis not present

## 2023-03-15 DIAGNOSIS — Z7902 Long term (current) use of antithrombotics/antiplatelets: Secondary | ICD-10-CM | POA: Insufficient documentation

## 2023-03-15 DIAGNOSIS — Z8616 Personal history of COVID-19: Secondary | ICD-10-CM | POA: Diagnosis not present

## 2023-03-15 DIAGNOSIS — Z79899 Other long term (current) drug therapy: Secondary | ICD-10-CM | POA: Diagnosis not present

## 2023-03-15 DIAGNOSIS — G4730 Sleep apnea, unspecified: Secondary | ICD-10-CM | POA: Diagnosis not present

## 2023-03-15 DIAGNOSIS — N184 Chronic kidney disease, stage 4 (severe): Secondary | ICD-10-CM

## 2023-03-15 DIAGNOSIS — Z981 Arthrodesis status: Secondary | ICD-10-CM | POA: Diagnosis not present

## 2023-03-15 DIAGNOSIS — Z08 Encounter for follow-up examination after completed treatment for malignant neoplasm: Secondary | ICD-10-CM | POA: Insufficient documentation

## 2023-03-15 DIAGNOSIS — Z85820 Personal history of malignant melanoma of skin: Secondary | ICD-10-CM | POA: Insufficient documentation

## 2023-03-15 DIAGNOSIS — I701 Atherosclerosis of renal artery: Secondary | ICD-10-CM | POA: Diagnosis not present

## 2023-03-15 DIAGNOSIS — Z09 Encounter for follow-up examination after completed treatment for conditions other than malignant neoplasm: Secondary | ICD-10-CM | POA: Insufficient documentation

## 2023-03-15 HISTORY — PX: AV FISTULA PLACEMENT: SHX1204

## 2023-03-15 HISTORY — DX: Gastro-esophageal reflux disease without esophagitis: K21.9

## 2023-03-15 LAB — POCT I-STAT, CHEM 8
BUN: 33 mg/dL — ABNORMAL HIGH (ref 8–23)
Calcium, Ion: 1.14 mmol/L — ABNORMAL LOW (ref 1.15–1.40)
Chloride: 94 mmol/L — ABNORMAL LOW (ref 98–111)
Creatinine, Ser: 4.7 mg/dL — ABNORMAL HIGH (ref 0.61–1.24)
Glucose, Bld: 110 mg/dL — ABNORMAL HIGH (ref 70–99)
HCT: 36 % — ABNORMAL LOW (ref 39.0–52.0)
Hemoglobin: 12.2 g/dL — ABNORMAL LOW (ref 13.0–17.0)
Potassium: 3.3 mmol/L — ABNORMAL LOW (ref 3.5–5.1)
Sodium: 129 mmol/L — ABNORMAL LOW (ref 135–145)
TCO2: 24 mmol/L (ref 22–32)

## 2023-03-15 LAB — GLUCOSE, CAPILLARY
Glucose-Capillary: 119 mg/dL — ABNORMAL HIGH (ref 70–99)
Glucose-Capillary: 166 mg/dL — ABNORMAL HIGH (ref 70–99)

## 2023-03-15 SURGERY — ARTERIOVENOUS (AV) FISTULA CREATION
Anesthesia: Monitor Anesthesia Care | Laterality: Left

## 2023-03-15 MED ORDER — OXYCODONE-ACETAMINOPHEN 5-325 MG PO TABS: 1.0000 | ORAL_TABLET | Freq: Four times a day (QID) | ORAL | 0 refills | Status: DC | PRN

## 2023-03-15 MED ORDER — CHLORHEXIDINE GLUCONATE 4 % EX SOLN: 60.0000 mL | Freq: Once | CUTANEOUS | Status: DC

## 2023-03-15 MED ORDER — ACETAMINOPHEN 500 MG PO TABS: 1000.0000 mg | ORAL_TABLET | Freq: Once | ORAL | Status: AC

## 2023-03-15 MED ORDER — CHLORHEXIDINE GLUCONATE 0.12 % MT SOLN
OROMUCOSAL | Status: AC
  Administered 2023-03-15: 15 mL via OROMUCOSAL
  Filled 2023-03-15: qty 15

## 2023-03-15 MED ORDER — ORAL CARE MOUTH RINSE: 15.0000 mL | Freq: Once | OROMUCOSAL | Status: AC

## 2023-03-15 MED ORDER — HEPARIN 6000 UNIT IRRIGATION SOLUTION
Status: AC
  Filled 2023-03-15: qty 500

## 2023-03-15 MED ORDER — PROPOFOL 10 MG/ML IV BOLUS
INTRAVENOUS | Status: DC | PRN
  Administered 2023-03-15: 20 mg via INTRAVENOUS
  Administered 2023-03-15: 75 ug/kg/min via INTRAVENOUS

## 2023-03-15 MED ORDER — EPINEPHRINE PF 1 MG/ML IJ SOLN
INTRAMUSCULAR | Status: DC | PRN
  Administered 2023-03-15: .125 mg via SUBCUTANEOUS

## 2023-03-15 MED ORDER — ALBUMIN HUMAN 5 % IV SOLN: INTRAVENOUS | Status: DC | PRN

## 2023-03-15 MED ORDER — VASOPRESSIN 20 UNIT/ML IV SOLN
INTRAVENOUS | Status: DC | PRN
  Administered 2023-03-15: 1 [IU] via INTRAVENOUS

## 2023-03-15 MED ORDER — PHENYLEPHRINE 80 MCG/ML (10ML) SYRINGE FOR IV PUSH (FOR BLOOD PRESSURE SUPPORT)
PREFILLED_SYRINGE | INTRAVENOUS | Status: DC | PRN
  Administered 2023-03-15 (×3): 160 ug via INTRAVENOUS

## 2023-03-15 MED ORDER — MIDAZOLAM HCL 2 MG/2ML IJ SOLN
INTRAMUSCULAR | Status: DC | PRN
  Administered 2023-03-15: 1 mg via INTRAVENOUS

## 2023-03-15 MED ORDER — HEPARIN 6000 UNIT IRRIGATION SOLUTION
Status: DC | PRN
  Administered 2023-03-15: 1

## 2023-03-15 MED ORDER — LIDOCAINE HCL (PF) 1 % IJ SOLN
INTRAMUSCULAR | Status: AC
  Filled 2023-03-15: qty 30

## 2023-03-15 MED ORDER — VASOPRESSIN 20 UNIT/ML IV SOLN
INTRAVENOUS | Status: AC
  Filled 2023-03-15: qty 1

## 2023-03-15 MED ORDER — EPHEDRINE 5 MG/ML INJ
INTRAVENOUS | Status: AC
  Filled 2023-03-15: qty 5

## 2023-03-15 MED ORDER — HEMOSTATIC AGENTS (NO CHARGE) OPTIME
TOPICAL | Status: DC | PRN
  Administered 2023-03-15: 1 via TOPICAL

## 2023-03-15 MED ORDER — EPHEDRINE SULFATE-NACL 50-0.9 MG/10ML-% IV SOSY
PREFILLED_SYRINGE | INTRAVENOUS | Status: DC | PRN
  Administered 2023-03-15: 5 mg via INTRAVENOUS

## 2023-03-15 MED ORDER — FENTANYL CITRATE (PF) 250 MCG/5ML IJ SOLN
INTRAMUSCULAR | Status: DC | PRN
  Administered 2023-03-15: 50 ug via INTRAVENOUS

## 2023-03-15 MED ORDER — ONDANSETRON HCL 4 MG/2ML IJ SOLN
INTRAMUSCULAR | Status: DC | PRN
  Administered 2023-03-15: 4 mg via INTRAVENOUS

## 2023-03-15 MED ORDER — 0.9 % SODIUM CHLORIDE (POUR BTL) OPTIME
TOPICAL | Status: DC | PRN
  Administered 2023-03-15: 1000 mL

## 2023-03-15 MED ORDER — SODIUM CHLORIDE 0.9 % IV SOLN: INTRAVENOUS | Status: DC

## 2023-03-15 MED ORDER — CEFAZOLIN SODIUM-DEXTROSE 2-4 GM/100ML-% IV SOLN
2.0000 g | INTRAVENOUS | Status: AC
  Administered 2023-03-15: 2 g via INTRAVENOUS

## 2023-03-15 MED ORDER — PHENYLEPHRINE HCL-NACL 20-0.9 MG/250ML-% IV SOLN
INTRAVENOUS | Status: DC | PRN
  Administered 2023-03-15: 40 ug/min via INTRAVENOUS

## 2023-03-15 MED ORDER — INSULIN ASPART 100 UNIT/ML IJ SOLN: 0.0000 [IU] | INTRAMUSCULAR | Status: DC | PRN

## 2023-03-15 MED ORDER — MEPIVACAINE HCL (PF) 2 % IJ SOLN
INTRAMUSCULAR | Status: DC | PRN
  Administered 2023-03-15: 25 mL

## 2023-03-15 MED ORDER — ONDANSETRON HCL 4 MG/2ML IJ SOLN
INTRAMUSCULAR | Status: AC
  Filled 2023-03-15: qty 2

## 2023-03-15 MED ORDER — PHENYLEPHRINE 80 MCG/ML (10ML) SYRINGE FOR IV PUSH (FOR BLOOD PRESSURE SUPPORT)
PREFILLED_SYRINGE | INTRAVENOUS | Status: AC
  Filled 2023-03-15: qty 10

## 2023-03-15 MED ORDER — CEFAZOLIN SODIUM-DEXTROSE 2-4 GM/100ML-% IV SOLN
INTRAVENOUS | Status: AC
  Filled 2023-03-15: qty 100

## 2023-03-15 MED ORDER — MIDAZOLAM HCL 2 MG/2ML IJ SOLN
INTRAMUSCULAR | Status: AC
  Filled 2023-03-15: qty 2

## 2023-03-15 MED ORDER — CHLORHEXIDINE GLUCONATE 0.12 % MT SOLN: 15.0000 mL | Freq: Once | OROMUCOSAL | Status: AC

## 2023-03-15 MED ORDER — FENTANYL CITRATE (PF) 250 MCG/5ML IJ SOLN
INTRAMUSCULAR | Status: AC
  Filled 2023-03-15: qty 5

## 2023-03-15 MED ORDER — ACETAMINOPHEN 500 MG PO TABS
ORAL_TABLET | ORAL | Status: AC
  Administered 2023-03-15: 1000 mg via ORAL
  Filled 2023-03-15: qty 2

## 2023-03-15 SURGICAL SUPPLY — 33 items
ADH SKN CLS APL DERMABOND .7 (GAUZE/BANDAGES/DRESSINGS) ×1
AGENT HMST KT MTR STRL THRMB (HEMOSTASIS) ×1
ARMBAND PINK RESTRICT EXTREMIT (MISCELLANEOUS) ×2 IMPLANT
BAG COUNTER SPONGE SURGICOUNT (BAG) ×1 IMPLANT
BAG SPNG CNTER NS LX DISP (BAG) ×1
CANISTER SUCT 3000ML PPV (MISCELLANEOUS) ×1 IMPLANT
CLIP TI MEDIUM 6 (CLIP) ×1 IMPLANT
CLIP TI WIDE RED SMALL 6 (CLIP) ×1 IMPLANT
COVER PROBE W GEL 5X96 (DRAPES) ×1 IMPLANT
DERMABOND ADVANCED .7 DNX12 (GAUZE/BANDAGES/DRESSINGS) ×1 IMPLANT
ELECT REM PT RETURN 9FT ADLT (ELECTROSURGICAL) ×1
ELECTRODE REM PT RTRN 9FT ADLT (ELECTROSURGICAL) ×1 IMPLANT
GLOVE SURG SS PI 7.5 STRL IVOR (GLOVE) ×3 IMPLANT
GOWN STRL REUS W/ TWL LRG LVL3 (GOWN DISPOSABLE) ×2 IMPLANT
GOWN STRL REUS W/ TWL XL LVL3 (GOWN DISPOSABLE) ×1 IMPLANT
GOWN STRL REUS W/TWL LRG LVL3 (GOWN DISPOSABLE) ×2
GOWN STRL REUS W/TWL XL LVL3 (GOWN DISPOSABLE) ×1
HEMOSTAT SNOW SURGICEL 2X4 (HEMOSTASIS) IMPLANT
KIT BASIN OR (CUSTOM PROCEDURE TRAY) ×1 IMPLANT
KIT TURNOVER KIT B (KITS) ×1 IMPLANT
NS IRRIG 1000ML POUR BTL (IV SOLUTION) ×1 IMPLANT
PACK CV ACCESS (CUSTOM PROCEDURE TRAY) ×1 IMPLANT
PAD ARMBOARD 7.5X6 YLW CONV (MISCELLANEOUS) ×2 IMPLANT
SLING ARM FOAM STRAP LRG (SOFTGOODS) IMPLANT
SLING ARM FOAM STRAP MED (SOFTGOODS) IMPLANT
SURGIFLO W/THROMBIN 8M KIT (HEMOSTASIS) IMPLANT
SUT PROLENE 6 0 CC (SUTURE) ×1 IMPLANT
SUT VIC AB 3-0 SH 27 (SUTURE) ×1
SUT VIC AB 3-0 SH 27X BRD (SUTURE) ×1 IMPLANT
SUT VICRYL 4-0 PS2 18IN ABS (SUTURE) ×1 IMPLANT
TOWEL GREEN STERILE (TOWEL DISPOSABLE) ×1 IMPLANT
UNDERPAD 30X36 HEAVY ABSORB (UNDERPADS AND DIAPERS) ×1 IMPLANT
WATER STERILE IRR 1000ML POUR (IV SOLUTION) ×1 IMPLANT

## 2023-03-15 NOTE — Transfer of Care (Signed)
Immediate Anesthesia Transfer of Care Note  Patient: Raymond [name]  Procedure(s) Performed: LEFT ARM FISTULA CREATION (Left)  Patient Location: PACU  Anesthesia Type:MAC and Regional  Level of Consciousness: drowsy and patient cooperative  Airway & Oxygen Therapy: Patient connected to face mask oxygen  Post-op Assessment: Report given to RN, Post -op Vital signs reviewed and stable, and Patient moving all extremities X 4  Post vital signs: Reviewed and stable  Last Vitals:  Vitals Value Taken Time  BP 138/55 03/15/23 0945  Temp    Pulse 74 03/15/23 0945  Resp 12 03/15/23 0945  SpO2 98 % 03/15/23 0945  Vitals shown include unvalidated device data.  Last Pain:  Vitals:   03/15/23 0708  PainSc: 0-No pain         Complications: There were no known notable events for this encounter.

## 2023-03-15 NOTE — Op Note (Signed)
    Patient name: Raymond [name] MRN: [id number] DOB: 1960-11-17 Sex: male  03/15/2023 Pre-operative Diagnosis: CKD 4 Post-operative diagnosis:  Same Surgeon:  Durene Cal Assistants:  Sena Hitch, PA Procedure:   First stage left basilic vein fistula creation Anesthesia:  regional Blood Loss:  minimal Specimens:  none  Findings: Excellent appearing 5 mm basilic vein and 4 mm disease-free artery.  I used a medial branch for the anastomosis.  I then traced the basilic vein back to the bifurcation and ligated the primary basilic vein.  There was an excellent thrill in the fistula after completion and a palpable radial pulse  Indications: This is a 62 year old gentleman who comes in today for fistula creation.  Vein mapping shows an excellent basilic vein.  Procedure:  The patient was identified in the holding area and taken to [facility] OR ROOM 11  The patient was then placed supine on the table. regional anesthesia was administered.  The patient was prepped and draped in the usual sterile fashion.  A time out was called and antibiotics were administered. A PA was necessary to expedite the procedure and assist with technical details.  She helped with exposure by providing suction and retraction.  She helped with the anastomosis by following the suture.   Ultrasound was used to evaluate the cephalic and basilic vein in the upper arm.  The cephalic vein was too small.  The basilic vein was 5 mm.  There was a medial branch which was the dominant vein to the antecubital.  A transverse incision was made just above the antecubital crease.  I first dissected out the medial branch of the basilic vein which was a 5 mm vein.  It was fully mobilized and marked for orientation.  I then exposed the brachial artery which was a 4 mm disease-free artery.  The vein was then ligated distally.  It was flushed with heparin saline and distended nicely.  The brachial artery was then occluded.  A #11 blade was used to make an  arteriotomy which was extended longitudinally with Potts scissors.  The vein was cut to the appropriate length and spatulated to fit the size of the arteriotomy.  A running anastomosis was created with 6-0 Prolene.  Prior to completion, the appropriate flushing maneuvers were performed and the anastomosis was completed.  There was an excellent thrill within the fistula and a palpable radial pulse.  I then dissected proximally on the basilic vein up to its primary branch point.  I then ligated 2 large branches.  The wound was then irrigated.  Hemostasis was achieved.  The deep tissue was reapproximated with 3-0 Vicryl and the skin was closed with 4-0 Vicryl followed by Dermabond.  There were no immediate complications.   Disposition: To PACU stable.   Juleen China, M.D., [facility] [place] Office: [phone] Pager:  [phone]

## 2023-03-15 NOTE — Discharge Instructions (Signed)
° °  [facility] [place] ° °Discharge Instructions ° °AV Fistula or Graft Surgery for Dialysis Access ° °Please refer to the following instructions for your post-procedure care. Your surgeon or physician assistant will discuss any changes with you. ° °Activity ° °You may drive the day following your surgery, if you are comfortable and no longer taking prescription pain medication. Resume full activity as the soreness in your incision resolves. ° °Bathing/Showering ° °You may shower after you go home. Keep your incision dry for 48 hours. Do not soak in a bathtub, hot tub, or swim until the incision heals completely. You may not shower if you have a hemodialysis catheter. ° °Incision Care ° °Clean your incision with mild soap and water after 48 hours. Pat the area dry with a clean towel. You do not need a bandage unless otherwise instructed. Do not apply any ointments or creams to your incision. You may have skin glue on your incision. Do not peel it off. It will come off on its own in about one week. Your arm may swell a bit after surgery. To reduce swelling use pillows to elevate your arm so it is above your heart. Your doctor will tell you if you need to lightly wrap your arm with an ACE bandage. ° °Diet ° °Resume your normal diet. There are not special food restrictions following this procedure. In order to heal from your surgery, it is CRITICAL to get adequate nutrition. Your body requires vitamins, minerals, and protein. Vegetables are the best source of vitamins and minerals. Vegetables also provide the perfect balance of protein. Processed food has little nutritional value, so try to avoid this. ° °Medications ° °Resume taking all of your medications. If your incision is causing pain, you may take over-the counter pain relievers such as acetaminophen (Tylenol). If you were prescribed a stronger pain medication, please be aware these medications can cause nausea and constipation. Prevent  nausea by taking the medication with a snack or meal. Avoid constipation by drinking plenty of fluids and eating foods with high amount of fiber, such as fruits, vegetables, and grains. Do not take Tylenol if you are taking prescription pain medications. ° ° ° ° °Follow up °Your surgeon may want to see you in the office following your access surgery. If so, this will be arranged at the time of your surgery. ° °Please call us immediately for any of the following conditions: ° °Increased pain, redness, drainage (pus) from your incision site °Fever of 101 degrees or higher °Severe or worsening pain at your incision site °Hand pain or numbness. ° °Reduce your risk of vascular disease: ° °Stop smoking. If you would like help, call [facility] at 1-800-QUIT-NOW (1-800-784-8669) or [address] at 336-586-4000 ° °Manage your cholesterol °Maintain a desired weight °Control your diabetes °Keep your blood pressure down ° °Dialysis ° °It will take several weeks to several months for your new dialysis access to be ready for use. Your surgeon will determine when it is OK to use it. Your nephrologist will continue to direct your dialysis. You can continue to use your Permcath until your new access is ready for use. ° °If you have any questions, please call the office at [phone]. ° °

## 2023-03-15 NOTE — Interval H&P Note (Signed)
History and Physical Interval Note:  03/15/2023 8:18 AM  Raymond [name]  has presented today for surgery, with the diagnosis of Chronic Kidney Disease IV.  The various methods of treatment have been discussed with the patient and family. After consideration of risks, benefits and other options for treatment, the patient has consented to  Procedure(s): LEFT ARM FISTULA CREATION (Left) as a surgical intervention.  The patient's history has been reviewed, patient examined, no change in status, stable for surgery.  I have reviewed the patient's chart and labs.  Questions were answered to the patient's satisfaction.     Durene Cal

## 2023-03-15 NOTE — Anesthesia Procedure Notes (Signed)
Anesthesia Regional Block: Supraclavicular block   Pre-Anesthetic Checklist: , timeout performed,  Correct Patient, Correct Site, Correct Laterality,  Correct Procedure, Correct Position, site marked,  Risks and benefits discussed,  Pre-op evaluation,  At surgeon's request and post-op pain management  Laterality: Left  Prep: Maximum Sterile Barrier Precautions used, chloraprep       Needles:  Injection technique: Single-shot  Needle Type: Echogenic Stimulator Needle     Needle Length: 9cm  Needle Gauge: 22     Additional Needles:   Procedures:,,,, ultrasound used (permanent image in chart),,    Narrative:  Start time: 03/15/2023 8:15 AM End time: 03/15/2023 8:18 AM Injection made incrementally with aspirations every 5 mL.  Performed by: Personally  Anesthesiologist: Kaylyn Layer, MD  Additional Notes: Risks, benefits, and alternative discussed. Patient gave consent for procedure. Patient prepped and draped in sterile fashion. Sedation administered, patient remains easily responsive to voice. Relevant anatomy identified with ultrasound guidance. Local anesthetic given in 5cc increments with no signs or symptoms of intravascular injection. No pain or paraesthesias with injection. Patient monitored throughout procedure with signs of LAST or immediate complications. Tolerated well. Ultrasound image placed in chart.  Amalia Greenhouse, MD

## 2023-03-15 NOTE — Anesthesia Postprocedure Evaluation (Signed)
Anesthesia Post Note  Patient: Raymond [name]  Procedure(s) Performed: LEFT ARM FISTULA CREATION (Left)     Patient location during evaluation: PACU Anesthesia Type: Regional Level of consciousness: awake and alert Pain management: pain level controlled Vital Signs Assessment: post-procedure vital signs reviewed and stable Respiratory status: spontaneous breathing, nonlabored ventilation and respiratory function stable Cardiovascular status: blood pressure returned to baseline Postop Assessment: no apparent nausea or vomiting Anesthetic complications: no   There were no known notable events for this encounter.  Last Vitals:  Vitals:   03/15/23 1000 03/15/23 1015  BP: (!) 143/69 132/65  Pulse: 80 73  Resp: 17 17  Temp:  36.6 C  SpO2: 92% 93%    Last Pain:  Vitals:   03/15/23 1015  PainSc: 0-No pain                 Shanda Howells

## 2023-03-15 NOTE — Progress Notes (Signed)
Orthopedic Tech Progress Note Patient Details:  Raymond [name] [name] 15-Feb-1961 [id number]  PACU RN called requesting an ARM SLING   Ortho Devices Type of Ortho Device: Arm sling Ortho Device/Splint Location: LUE Ortho Device/Splint Interventions: Ordered, Application, Adjustment   Post Interventions Patient Tolerated: Well Instructions Provided: Care of device  Raymond [name] 03/15/2023, 10:09 AM

## 2023-03-16 ENCOUNTER — Encounter (HOSPITAL_COMMUNITY): Payer: Self-pay | Admitting: Surgery

## 2023-03-20 ENCOUNTER — Telehealth: Payer: Self-pay | Admitting: Physician Assistant

## 2023-03-20 NOTE — Telephone Encounter (Signed)
-----   Message from Lars Mage, [place] sent at 03/15/2023  9:32 AM EDT ----- Raymond [name] s/p left UE first stage basilic f/u 5-6 weeks with duplex and plan for second stage on PA schedule

## 2023-03-23 ENCOUNTER — Other Ambulatory Visit (HOSPITAL_COMMUNITY)
Admission: RE | Admit: 2023-03-23 | Discharge: 2023-03-23 | Disposition: A | Discharge status: Home / Self Care | Payer: Medicare Other | Source: Ambulatory Visit | Attending: Nephrology | Admitting: Nephrology

## 2023-03-23 DIAGNOSIS — R808 Other proteinuria: Secondary | ICD-10-CM | POA: Insufficient documentation

## 2023-03-23 DIAGNOSIS — N184 Chronic kidney disease, stage 4 (severe): Secondary | ICD-10-CM | POA: Insufficient documentation

## 2023-03-23 DIAGNOSIS — I5033 Acute on chronic diastolic (congestive) heart failure: Secondary | ICD-10-CM | POA: Diagnosis not present

## 2023-03-23 DIAGNOSIS — E1122 Type 2 diabetes mellitus with diabetic chronic kidney disease: Secondary | ICD-10-CM | POA: Insufficient documentation

## 2023-03-23 DIAGNOSIS — E8722 Chronic metabolic acidosis: Secondary | ICD-10-CM | POA: Diagnosis not present

## 2023-03-23 LAB — CBC WITH DIFFERENTIAL/PLATELET
Abs Immature Granulocytes: 0.02 10*3/uL (ref 0.00–0.07)
Basophils Absolute: 0 10*3/uL (ref 0.0–0.1)
Basophils Relative: 1 %
Eosinophils Absolute: 0.1 10*3/uL (ref 0.0–0.5)
Eosinophils Relative: 2 %
HCT: 31.6 % — ABNORMAL LOW (ref 39.0–52.0)
Hemoglobin: 11 g/dL — ABNORMAL LOW (ref 13.0–17.0)
Immature Granulocytes: 0 %
Lymphocytes Relative: 16 %
Lymphs Abs: 1 10*3/uL (ref 0.7–4.0)
MCH: 29.2 pg (ref 26.0–34.0)
MCHC: 34.8 g/dL (ref 30.0–36.0)
MCV: 83.8 fL (ref 80.0–100.0)
Monocytes Absolute: 0.4 10*3/uL (ref 0.1–1.0)
Monocytes Relative: 7 %
Neutro Abs: 5 10*3/uL (ref 1.7–7.7)
Neutrophils Relative %: 74 %
Platelets: 165 10*3/uL (ref 150–400)
RBC: 3.77 MIL/uL — ABNORMAL LOW (ref 4.22–5.81)
RDW: 12.1 % (ref 11.5–15.5)
WBC: 6.6 10*3/uL (ref 4.0–10.5)
nRBC: 0 % (ref 0.0–0.2)

## 2023-03-23 LAB — RENAL FUNCTION PANEL
Albumin: 3.5 g/dL (ref 3.5–5.0)
Anion gap: 11 (ref 5–15)
BUN: 35 mg/dL — ABNORMAL HIGH (ref 8–23)
CO2: 22 mmol/L (ref 22–32)
Calcium: 8.5 mg/dL — ABNORMAL LOW (ref 8.9–10.3)
Chloride: 92 mmol/L — ABNORMAL LOW (ref 98–111)
Creatinine, Ser: 4.18 mg/dL — ABNORMAL HIGH (ref 0.61–1.24)
GFR, Estimated: 15 mL/min — ABNORMAL LOW (ref >60)
Glucose, Bld: 146 mg/dL — ABNORMAL HIGH (ref 70–99)
Phosphorus: 5.3 mg/dL — ABNORMAL HIGH (ref 2.5–4.6)
Potassium: 3.3 mmol/L — ABNORMAL LOW (ref 3.5–5.1)
Sodium: 125 mmol/L — ABNORMAL LOW (ref 135–145)

## 2023-03-23 LAB — PROTEIN / CREATININE RATIO, URINE
Creatinine, Urine: 65 mg/dL
Protein Creatinine Ratio: 3.45 mg/mg{Cre} — ABNORMAL HIGH (ref 0.00–0.15)
Total Protein, Urine: 224 mg/dL

## 2023-03-30 DIAGNOSIS — I5032 Chronic diastolic (congestive) heart failure: Secondary | ICD-10-CM | POA: Diagnosis not present

## 2023-03-30 DIAGNOSIS — E1122 Type 2 diabetes mellitus with diabetic chronic kidney disease: Secondary | ICD-10-CM | POA: Diagnosis not present

## 2023-03-30 DIAGNOSIS — I5033 Acute on chronic diastolic (congestive) heart failure: Secondary | ICD-10-CM | POA: Diagnosis not present

## 2023-03-30 DIAGNOSIS — N184 Chronic kidney disease, stage 4 (severe): Secondary | ICD-10-CM | POA: Diagnosis not present

## 2023-03-30 DIAGNOSIS — R808 Other proteinuria: Secondary | ICD-10-CM | POA: Diagnosis not present

## 2023-03-30 DIAGNOSIS — D649 Anemia, unspecified: Secondary | ICD-10-CM | POA: Diagnosis not present

## 2023-03-30 DIAGNOSIS — N189 Chronic kidney disease, unspecified: Secondary | ICD-10-CM | POA: Diagnosis not present

## 2023-03-30 DIAGNOSIS — N185 Chronic kidney disease, stage 5: Secondary | ICD-10-CM | POA: Diagnosis not present

## 2023-04-05 ENCOUNTER — Ambulatory Visit: Payer: Medicare Other | Admitting: Vascular Surgery

## 2023-04-05 ENCOUNTER — Encounter (HOSPITAL_COMMUNITY): Payer: Medicare Other

## 2023-04-05 ENCOUNTER — Other Ambulatory Visit (HOSPITAL_COMMUNITY): Payer: Medicare Other

## 2023-04-06 DIAGNOSIS — R808 Other proteinuria: Secondary | ICD-10-CM | POA: Diagnosis not present

## 2023-04-06 DIAGNOSIS — E1122 Type 2 diabetes mellitus with diabetic chronic kidney disease: Secondary | ICD-10-CM | POA: Diagnosis not present

## 2023-04-06 DIAGNOSIS — I5032 Chronic diastolic (congestive) heart failure: Secondary | ICD-10-CM | POA: Diagnosis not present

## 2023-04-10 ENCOUNTER — Other Ambulatory Visit: Payer: Self-pay | Admitting: *Deleted

## 2023-04-10 DIAGNOSIS — N184 Chronic kidney disease, stage 4 (severe): Secondary | ICD-10-CM

## 2023-04-12 DIAGNOSIS — N186 End stage renal disease: Secondary | ICD-10-CM | POA: Diagnosis not present

## 2023-04-12 DIAGNOSIS — Z992 Dependence on renal dialysis: Secondary | ICD-10-CM | POA: Diagnosis not present

## 2023-04-13 DIAGNOSIS — Z992 Dependence on renal dialysis: Secondary | ICD-10-CM | POA: Diagnosis not present

## 2023-04-13 DIAGNOSIS — N250 Renal osteodystrophy: Secondary | ICD-10-CM | POA: Diagnosis not present

## 2023-04-13 DIAGNOSIS — E119 Type 2 diabetes mellitus without complications: Secondary | ICD-10-CM | POA: Diagnosis not present

## 2023-04-13 DIAGNOSIS — Z23 Encounter for immunization: Secondary | ICD-10-CM | POA: Diagnosis not present

## 2023-04-13 DIAGNOSIS — D509 Iron deficiency anemia, unspecified: Secondary | ICD-10-CM | POA: Diagnosis not present

## 2023-04-13 DIAGNOSIS — N186 End stage renal disease: Secondary | ICD-10-CM | POA: Diagnosis not present

## 2023-04-15 DIAGNOSIS — Z992 Dependence on renal dialysis: Secondary | ICD-10-CM | POA: Diagnosis not present

## 2023-04-15 DIAGNOSIS — N250 Renal osteodystrophy: Secondary | ICD-10-CM | POA: Diagnosis not present

## 2023-04-15 DIAGNOSIS — D509 Iron deficiency anemia, unspecified: Secondary | ICD-10-CM | POA: Diagnosis not present

## 2023-04-15 DIAGNOSIS — N186 End stage renal disease: Secondary | ICD-10-CM | POA: Diagnosis not present

## 2023-04-15 DIAGNOSIS — Z23 Encounter for immunization: Secondary | ICD-10-CM | POA: Diagnosis not present

## 2023-04-18 DIAGNOSIS — N186 End stage renal disease: Secondary | ICD-10-CM | POA: Diagnosis not present

## 2023-04-18 DIAGNOSIS — D509 Iron deficiency anemia, unspecified: Secondary | ICD-10-CM | POA: Diagnosis not present

## 2023-04-18 DIAGNOSIS — Z992 Dependence on renal dialysis: Secondary | ICD-10-CM | POA: Diagnosis not present

## 2023-04-18 DIAGNOSIS — Z23 Encounter for immunization: Secondary | ICD-10-CM | POA: Diagnosis not present

## 2023-04-18 DIAGNOSIS — N250 Renal osteodystrophy: Secondary | ICD-10-CM | POA: Diagnosis not present

## 2023-04-20 DIAGNOSIS — Z23 Encounter for immunization: Secondary | ICD-10-CM | POA: Diagnosis not present

## 2023-04-20 DIAGNOSIS — D509 Iron deficiency anemia, unspecified: Secondary | ICD-10-CM | POA: Diagnosis not present

## 2023-04-20 DIAGNOSIS — N186 End stage renal disease: Secondary | ICD-10-CM | POA: Diagnosis not present

## 2023-04-20 DIAGNOSIS — N250 Renal osteodystrophy: Secondary | ICD-10-CM | POA: Diagnosis not present

## 2023-04-20 DIAGNOSIS — Z992 Dependence on renal dialysis: Secondary | ICD-10-CM | POA: Diagnosis not present

## 2023-04-25 DIAGNOSIS — Z23 Encounter for immunization: Secondary | ICD-10-CM | POA: Diagnosis not present

## 2023-04-25 DIAGNOSIS — N250 Renal osteodystrophy: Secondary | ICD-10-CM | POA: Diagnosis not present

## 2023-04-25 DIAGNOSIS — D509 Iron deficiency anemia, unspecified: Secondary | ICD-10-CM | POA: Diagnosis not present

## 2023-04-25 DIAGNOSIS — N186 End stage renal disease: Secondary | ICD-10-CM | POA: Diagnosis not present

## 2023-04-25 DIAGNOSIS — Z992 Dependence on renal dialysis: Secondary | ICD-10-CM | POA: Diagnosis not present

## 2023-04-27 DIAGNOSIS — D509 Iron deficiency anemia, unspecified: Secondary | ICD-10-CM | POA: Diagnosis not present

## 2023-04-27 DIAGNOSIS — Z992 Dependence on renal dialysis: Secondary | ICD-10-CM | POA: Diagnosis not present

## 2023-04-27 DIAGNOSIS — N250 Renal osteodystrophy: Secondary | ICD-10-CM | POA: Diagnosis not present

## 2023-04-27 DIAGNOSIS — Z23 Encounter for immunization: Secondary | ICD-10-CM | POA: Diagnosis not present

## 2023-04-27 DIAGNOSIS — N186 End stage renal disease: Secondary | ICD-10-CM | POA: Diagnosis not present

## 2023-04-29 DIAGNOSIS — N186 End stage renal disease: Secondary | ICD-10-CM | POA: Diagnosis not present

## 2023-04-29 DIAGNOSIS — Z992 Dependence on renal dialysis: Secondary | ICD-10-CM | POA: Diagnosis not present

## 2023-04-29 DIAGNOSIS — D509 Iron deficiency anemia, unspecified: Secondary | ICD-10-CM | POA: Diagnosis not present

## 2023-04-29 DIAGNOSIS — Z23 Encounter for immunization: Secondary | ICD-10-CM | POA: Diagnosis not present

## 2023-04-29 DIAGNOSIS — N250 Renal osteodystrophy: Secondary | ICD-10-CM | POA: Diagnosis not present

## 2023-05-01 ENCOUNTER — Ambulatory Visit (HOSPITAL_COMMUNITY)
Admission: RE | Admit: 2023-05-01 | Discharge: 2023-05-01 | Disposition: A | Discharge status: Home / Self Care | Payer: Medicare Other | Source: Ambulatory Visit | Attending: Surgery | Admitting: Surgery

## 2023-05-01 ENCOUNTER — Ambulatory Visit: Payer: Medicare Other | Admitting: Physician Assistant

## 2023-05-01 ENCOUNTER — Other Ambulatory Visit: Payer: Self-pay

## 2023-05-01 VITALS — BP 149/68 | HR 70 | Temp 98.7°F | Resp 16 | Ht 72.0 in | Wt 169.7 lb

## 2023-05-01 DIAGNOSIS — T82898A Other specified complication of vascular prosthetic devices, implants and grafts, initial encounter: Secondary | ICD-10-CM

## 2023-05-01 DIAGNOSIS — N184 Chronic kidney disease, stage 4 (severe): Secondary | ICD-10-CM | POA: Diagnosis not present

## 2023-05-01 DIAGNOSIS — N186 End stage renal disease: Secondary | ICD-10-CM

## 2023-05-01 DIAGNOSIS — Z992 Dependence on renal dialysis: Secondary | ICD-10-CM

## 2023-05-01 NOTE — Anesthesia Preprocedure Evaluation (Signed)
Anesthesia Evaluation  Patient identified by MRN, date of birth, ID band Patient awake    Reviewed: Allergy & Precautions, H&P , NPO status , Patient's Chart, lab work & pertinent test results  Airway Mallampati: II  TM Distance: >3 FB Neck ROM: Full    Dental no notable dental hx.    Pulmonary sleep apnea , COPD, Current Smoker   Pulmonary exam normal breath sounds clear to auscultation       Cardiovascular hypertension, + Peripheral Vascular Disease  Normal cardiovascular exam Rhythm:Regular Rate:Normal     Neuro/Psych negative neurological ROS  negative psych ROS   GI/Hepatic negative GI ROS, Neg liver ROS,,,  Endo/Other  diabetes    Renal/GU Renal InsufficiencyRenal disease  negative genitourinary   Musculoskeletal negative musculoskeletal ROS (+)    Abdominal   Peds negative pediatric ROS (+)  Hematology negative hematology ROS (+)   Anesthesia Other Findings   Reproductive/Obstetrics negative OB ROS                             Anesthesia Physical Anesthesia Plan  ASA: 3  Anesthesia Plan: General   Post-op Pain Management: Minimal or no pain anticipated   Induction: Intravenous  PONV Risk Score and Plan: Ondansetron and Treatment may vary due to age or medical condition  Airway Management Planned: LMA  Additional Equipment:   Intra-op Plan:   Post-operative Plan: Extubation in OR  Informed Consent: I have reviewed the patients History and Physical, chart, labs and discussed the procedure including the risks, benefits and alternatives for the proposed anesthesia with the patient or authorized representative who has indicated his/her understanding and acceptance.     Dental advisory given  Plan Discussed with: CRNA and Surgeon  Anesthesia Plan Comments: (PAT note written 05/01/2023 by Shonna Chock, PA-C.  )       Anesthesia Quick Evaluation

## 2023-05-01 NOTE — Progress Notes (Signed)
Office Note     CC:  follow up Requesting Provider:  Juliette Alcide, MD  HPI: Raymond [name] is a 62 y.o. (1961-08-05) male who presents status post left arm first stage basilic vein fistula by Dr. Myra Gianotti on 03/15/2023.  1 to 2 weeks after surgery he developed a cold sensation in his left hand.  He has also developed numbness and weakness and is unable to hold anything heavy or put on his pants using his left hand.  He has since started on hemodialysis and surgery and is dialyzing via right IJ TDC at [facility] in [place].  He does not take a blood thinner.  His last dialysis was on Saturday, 04/29/2023.   Past Medical History:  Diagnosis Date   Cancer (HCC)    melanoma   Carotid artery occlusion    Cervical spondylosis with myelopathy    Arthritis   CKD (chronic kidney disease)    stage III (07/2021)   COPD (chronic obstructive pulmonary disease) (HCC) 2022   Diabetes mellitus    Type II   GERD (gastroesophageal reflux disease)    History of blood transfusion 08/2021   2 units- had a hematoma after stent to carotid   Hyperlipidemia    Hypertension    Impotence of organic origin    Insomnia    Peripheral arterial disease (HCC)    Restless leg syndrome    Sleep apnea     Past Surgical History:  Procedure Laterality Date   ANTERIOR CERVICAL DECOMP/DISCECTOMY FUSION N/A 07/13/2022   Procedure: Cervical Three-Four Anterior Cervical Discectomy Fusion with Plate removal;  Surgeon: Coletta Memos, MD;  Location: [facility] OR;  Service: Neurosurgery;  Laterality: N/A;  3C/RM 21 BEFORE DR CABBELLS OTHER CASE IN RM 21   APPENDECTOMY     years ago   AV FISTULA PLACEMENT Left 03/15/2023   Procedure: LEFT ARM FISTULA CREATION;  Surgeon: Nada Libman, MD;  Location: [facility] OR;  Service: Vascular;  Laterality: Left;  with regional block   EYE SURGERY  2017   cataracts   spinal fusion  09/12/1998   SPINE SURGERY  09/12/1998   Cervical Myelopathy- S/P fusion   TRANSCAROTID ARTERY REVASCULARIZATION   Right 08/13/2021   Procedure: RIGHT TRANSCAROTID ARTERY REVASCULARIZATION;  Surgeon: Maeola Harman, MD;  Location: [facility] OR;  Service: Vascular;  Laterality: Right;   ULTRASOUND GUIDANCE FOR VASCULAR ACCESS Left 08/13/2021   Procedure: ULTRASOUND GUIDANCE FOR VASCULAR ACCESS;  Surgeon: Maeola Harman, MD;  Location: [facility] OR;  Service: Vascular;  Laterality: Left;    Social History   Socioeconomic History   Marital status: Married    Spouse name: Not on file   Number of children: Not on file   Years of education: Not on file   Highest education level: Not on file  Occupational History   Not on file  Tobacco Use   Smoking status: Every Day    Current packs/day: 1.00    Average packs/day: 1 pack/day for 30.0 years (30.0 ttl pk-yrs)    Types: Cigarettes   Smokeless tobacco: Never  Vaping Use   Vaping status: Never Used  Substance and Sexual Activity   Alcohol use: Not Currently   Drug use: No   Sexual activity: Not on file  Other Topics Concern   Not on file  Social History Narrative   Not on file   Social Determinants of Health   Financial Resource Strain: Not on file  Food Insecurity: Not on file  Transportation Needs:  Not on file  Physical Activity: Not on file  Stress: Not on file  Social Connections: Not on file  Intimate Partner Violence: Not on file    Family History  Problem Relation Age of Onset   Cancer Mother    Hypertension Father    Hypertension Sister     Current Outpatient Medications  Medication Sig Dispense Refill   acetaminophen (TYLENOL) 500 MG tablet Take 500 mg by mouth every 6 (six) hours as needed for moderate pain.     amLODipine (NORVASC) 10 MG tablet Take 10 mg by mouth daily.     aspirin 81 MG tablet Take 81 mg by mouth daily.     cilostazol (PLETAL) 100 MG tablet TAKE 1 TABLET 2 TIMES A DAY BEFORE A MEAL. 60 tablet 11   doxazosin (CARDURA) 4 MG tablet Take 1 tablet (4 mg total) by mouth daily. 30 tablet 3   famotidine  (PEPCID) 20 MG tablet Take 20 mg by mouth daily as needed for heartburn or indigestion.     ferrous sulfate 325 (65 FE) MG EC tablet Take 1 tablet (325 mg total) by mouth daily with breakfast. 30 tablet 0   fluticasone (FLONASE) 50 MCG/ACT nasal spray Place 1 spray into both nostrils daily.     linagliptin (TRADJENTA) 5 MG TABS tablet Take 5 mg by mouth daily.     metoprolol succinate (TOPROL-XL) 50 MG 24 hr tablet Take 50 mg by mouth daily.     minoxidil (LONITEN) 10 MG tablet Take 10 mg by mouth 2 (two) times daily.     polyvinyl alcohol (LIQUIFILM TEARS) 1.4 % ophthalmic solution Place 1 drop into both eyes as needed for dry eyes.     Potassium 99 MG TABS Take 99 mg by mouth daily.     rosuvastatin (CRESTOR) 20 MG tablet Take 2 tablets (40 mg total) by mouth daily. (Patient taking differently: Take 20 mg by mouth daily.)     SPIRIVA HANDIHALER 18 MCG inhalation capsule Place 18 mcg into inhaler and inhale daily.     traZODone (DESYREL) 100 MG tablet Take 100 mg by mouth at bedtime.     furosemide (LASIX) 20 MG tablet Take 20 mg by mouth 2 (two) times daily.     oxyCODONE-acetaminophen (PERCOCET/ROXICET) 5-325 MG tablet Take 1 tablet by mouth every 6 (six) hours as needed. 30 tablet 0   oxyCODONE-acetaminophen (PERCOCET/ROXICET) 5-325 MG tablet Take 1 tablet by mouth every 6 (six) hours as needed. 12 tablet 0   No current facility-administered medications for this visit.    No Known Allergies   REVIEW OF SYSTEMS:   [X]  denotes positive finding, [ ]  denotes negative finding Cardiac  Comments:  Chest pain or chest pressure:    Shortness of breath upon exertion:    Short of breath when lying flat:    Irregular heart rhythm:        Vascular    Pain in calf, thigh, or hip brought on by ambulation:    Pain in feet at night that wakes you up from your sleep:     Blood clot in your veins:    Leg swelling:         Pulmonary    Oxygen at home:    Productive cough:     Wheezing:          Neurologic    Sudden weakness in arms or legs:     Sudden numbness in arms or legs:     Sudden  onset of difficulty speaking or slurred speech:    Temporary loss of vision in one eye:     Problems with dizziness:         Gastrointestinal    Blood in stool:     Vomited blood:         Genitourinary    Burning when urinating:     Blood in urine:        Psychiatric    Major depression:         Hematologic    Bleeding problems:    Problems with blood clotting too easily:        Skin    Rashes or ulcers:        Constitutional    Fever or chills:      PHYSICAL EXAMINATION:  Vitals:   05/01/23 1352  BP: (!) 149/68  Pulse: 70  Resp: 16  Temp: 98.7 F (37.1 C)  TempSrc: Temporal  SpO2: 98%  Weight: 169 lb 11.2 oz (77 kg)  Height: 6' (1.829 m)    General:  WDWN in NAD; vital signs documented above Gait: Not observed HENT: WNL, normocephalic Pulmonary: normal non-labored breathing , without Rales, rhonchi,  wheezing Cardiac: regular HR Abdomen: soft, NT, no masses Skin: without rashes Vascular Exam/Pulses: 1+ L radial pulse; palpable thrill in basilic vein of upper arm Extremities: without ischemic changes, without Gangrene , without cellulitis; without open wounds;  Musculoskeletal: no muscle wasting or atrophy  Neurologic: A&O X 3 Psychiatric:  The pt has Normal affect.   Non-Invasive Vascular Imaging:   Patent and mature left basilic vein fistula by duplex    ASSESSMENT/PLAN:: 62 y.o. male status post left first stage basilic vein fistula  -Significant symptoms of steal in the left hand starting 1 to 2 weeks after surgery.  He is unable to complete activities of daily living with his left hand given the severity of symptoms.  Patient was evaluated by Dr. Myra Gianotti in the office today.  Plan will be left arm fistula ligation in the operating room tomorrow.  He will continue HD via right IJ TDC.  We will discuss future permanent access as an outpatient.  Surgery  was discussed in detail with the patient and he is agreeable to proceed.  He is also aware he may not achieve full recovery of left hand function after ligation of fistula.   Emilie Rutter, PA-C [facility] [phone]  Clinic MD:   Myra Gianotti

## 2023-05-01 NOTE — Progress Notes (Addendum)
Instructions only  Anesthesia review: Y  Patient verbally denies any shortness of breath, fever, cough and chest pain during phone call   -------------  SDW INSTRUCTIONS given:  Your procedure is scheduled on Tuesday, August 20th.  Report to [facility] Main Entrance "A" at 1030 A.M., and check in at the Admitting office.  Call this number if you have problems the morning of surgery:  [phone]   Remember:  Do not eat or drink after midnight the night before your surgery    Take these medicines the morning of surgery with A SIP OF WATER  amLODipine (NORVASC)  aspirin  doxazosin (CARDURA)  fluticasone (FLONASE)  metoprolol succinate (TOPROL-XL)  minoxidil (LONITEN)  SPIRIVA  acetaminophen (TYLENOL)-if needed famotidine (PEPCID)-if needed LIQUIFILM TEARS-if needed  **DO NOT** take nagliptin (TRADJENTA) the morning of the surgery  ** PLEASE check your blood sugar the morning of your surgery when you wake up and every 2 hours until you get to the Short Stay unit.  If your blood sugar is less than 70 mg/dL, you will need to treat for low blood sugar: Do not take insulin. Treat a low blood sugar (less than 70 mg/dL) with  cup of clear juice (cranberry or apple), 4 glucose tablets, OR glucose gel. Recheck blood sugar in 15 minutes after treatment (to make sure it is greater than 70 mg/dL). If your blood sugar is not greater than 70 mg/dL on recheck, call [phone] for further instructions.   As of today, STOP taking any Aspirin (unless otherwise instructed by your surgeon) Aleve, Naproxen, Ibuprofen, Motrin, Advil, Goody's, BC's, all herbal medications, fish oil, and all vitamins.                      Do not wear jewelry, make up, or nail polish            Do not wear lotions, powders, perfumes/colognes, or deodorant.            Do not shave 48 hours prior to surgery.  Men may shave face and neck.            Do not bring valuables to the hospital.            [facility] is  not responsible for any belongings or valuables.  Do NOT Smoke (Tobacco/Vaping) 24 hours prior to your procedure If you use a CPAP at night, you may bring all equipment for your overnight stay.   Contacts, glasses, dentures or bridgework may not be worn into surgery.      For patients admitted to the hospital, discharge time will be determined by your treatment team.   Patients discharged the day of surgery will not be allowed to drive home, and someone needs to stay with them for 24 hours.    Special instructions:   [facility]- Preparing For Surgery  Before surgery, you can play an important role. Because skin is not sterile, your skin needs to be as free of germs as possible. You can reduce the number of germs on your skin by washing with CHG (chlorahexidine gluconate) Soap before surgery.  CHG is an antiseptic cleaner which kills germs and bonds with the skin to continue killing germs even after washing.    Oral Hygiene is also important to reduce your risk of infection.  Remember - BRUSH YOUR TEETH THE MORNING OF SURGERY WITH YOUR REGULAR TOOTHPASTE  Please do not use if you have an allergy to CHG or antibacterial  soaps. If your skin becomes reddened/irritated stop using the CHG.  Do not shave (including legs and underarms) for at least 48 hours prior to first CHG shower. It is OK to shave your face.  Please follow these instructions carefully.   Shower the NIGHT BEFORE SURGERY and the MORNING OF SURGERY with DIAL Soap.   Pat yourself dry with a CLEAN TOWEL.  Wear CLEAN PAJAMAS to bed the night before surgery  Place CLEAN SHEETS on your bed the night of your first shower and DO NOT SLEEP WITH PETS.   Day of Surgery: Please shower morning of surgery  Wear Clean/Comfortable clothing the morning of surgery Do not apply any deodorants/lotions.   Remember to brush your teeth WITH YOUR REGULAR TOOTHPASTE.   Questions were answered. Patient verbalized understanding of  instructions.

## 2023-05-01 NOTE — H&P (View-Only) (Signed)
Office Note     CC:  follow up Requesting Provider:  Juliette Alcide, MD  HPI: Raymond [name] is a 62 y.o. (1961-08-05) male who presents status post left arm first stage basilic vein fistula by Dr. Myra Gianotti on 03/15/2023.  1 to 2 weeks after surgery he developed a cold sensation in his left hand.  He has also developed numbness and weakness and is unable to hold anything heavy or put on his pants using his left hand.  He has since started on hemodialysis and surgery and is dialyzing via right IJ TDC at [facility] in [place].  He does not take a blood thinner.  His last dialysis was on Saturday, 04/29/2023.   Past Medical History:  Diagnosis Date   Cancer (HCC)    melanoma   Carotid artery occlusion    Cervical spondylosis with myelopathy    Arthritis   CKD (chronic kidney disease)    stage III (07/2021)   COPD (chronic obstructive pulmonary disease) (HCC) 2022   Diabetes mellitus    Type II   GERD (gastroesophageal reflux disease)    History of blood transfusion 08/2021   2 units- had a hematoma after stent to carotid   Hyperlipidemia    Hypertension    Impotence of organic origin    Insomnia    Peripheral arterial disease (HCC)    Restless leg syndrome    Sleep apnea     Past Surgical History:  Procedure Laterality Date   ANTERIOR CERVICAL DECOMP/DISCECTOMY FUSION N/A 07/13/2022   Procedure: Cervical Three-Four Anterior Cervical Discectomy Fusion with Plate removal;  Surgeon: Coletta Memos, MD;  Location: [facility] OR;  Service: Neurosurgery;  Laterality: N/A;  3C/RM 21 BEFORE DR CABBELLS OTHER CASE IN RM 21   APPENDECTOMY     years ago   AV FISTULA PLACEMENT Left 03/15/2023   Procedure: LEFT ARM FISTULA CREATION;  Surgeon: Nada Libman, MD;  Location: [facility] OR;  Service: Vascular;  Laterality: Left;  with regional block   EYE SURGERY  2017   cataracts   spinal fusion  09/12/1998   SPINE SURGERY  09/12/1998   Cervical Myelopathy- S/P fusion   TRANSCAROTID ARTERY REVASCULARIZATION   Right 08/13/2021   Procedure: RIGHT TRANSCAROTID ARTERY REVASCULARIZATION;  Surgeon: Maeola Harman, MD;  Location: [facility] OR;  Service: Vascular;  Laterality: Right;   ULTRASOUND GUIDANCE FOR VASCULAR ACCESS Left 08/13/2021   Procedure: ULTRASOUND GUIDANCE FOR VASCULAR ACCESS;  Surgeon: Maeola Harman, MD;  Location: [facility] OR;  Service: Vascular;  Laterality: Left;    Social History   Socioeconomic History   Marital status: Married    Spouse name: Not on file   Number of children: Not on file   Years of education: Not on file   Highest education level: Not on file  Occupational History   Not on file  Tobacco Use   Smoking status: Every Day    Current packs/day: 1.00    Average packs/day: 1 pack/day for 30.0 years (30.0 ttl pk-yrs)    Types: Cigarettes   Smokeless tobacco: Never  Vaping Use   Vaping status: Never Used  Substance and Sexual Activity   Alcohol use: Not Currently   Drug use: No   Sexual activity: Not on file  Other Topics Concern   Not on file  Social History Narrative   Not on file   Social Determinants of Health   Financial Resource Strain: Not on file  Food Insecurity: Not on file  Transportation Needs:  Not on file  Physical Activity: Not on file  Stress: Not on file  Social Connections: Not on file  Intimate Partner Violence: Not on file    Family History  Problem Relation Age of Onset   Cancer Mother    Hypertension Father    Hypertension Sister     Current Outpatient Medications  Medication Sig Dispense Refill   acetaminophen (TYLENOL) 500 MG tablet Take 500 mg by mouth every 6 (six) hours as needed for moderate pain.     amLODipine (NORVASC) 10 MG tablet Take 10 mg by mouth daily.     aspirin 81 MG tablet Take 81 mg by mouth daily.     cilostazol (PLETAL) 100 MG tablet TAKE 1 TABLET 2 TIMES A DAY BEFORE A MEAL. 60 tablet 11   doxazosin (CARDURA) 4 MG tablet Take 1 tablet (4 mg total) by mouth daily. 30 tablet 3   famotidine  (PEPCID) 20 MG tablet Take 20 mg by mouth daily as needed for heartburn or indigestion.     ferrous sulfate 325 (65 FE) MG EC tablet Take 1 tablet (325 mg total) by mouth daily with breakfast. 30 tablet 0   fluticasone (FLONASE) 50 MCG/ACT nasal spray Place 1 spray into both nostrils daily.     linagliptin (TRADJENTA) 5 MG TABS tablet Take 5 mg by mouth daily.     metoprolol succinate (TOPROL-XL) 50 MG 24 hr tablet Take 50 mg by mouth daily.     minoxidil (LONITEN) 10 MG tablet Take 10 mg by mouth 2 (two) times daily.     polyvinyl alcohol (LIQUIFILM TEARS) 1.4 % ophthalmic solution Place 1 drop into both eyes as needed for dry eyes.     Potassium 99 MG TABS Take 99 mg by mouth daily.     rosuvastatin (CRESTOR) 20 MG tablet Take 2 tablets (40 mg total) by mouth daily. (Patient taking differently: Take 20 mg by mouth daily.)     SPIRIVA HANDIHALER 18 MCG inhalation capsule Place 18 mcg into inhaler and inhale daily.     traZODone (DESYREL) 100 MG tablet Take 100 mg by mouth at bedtime.     furosemide (LASIX) 20 MG tablet Take 20 mg by mouth 2 (two) times daily.     oxyCODONE-acetaminophen (PERCOCET/ROXICET) 5-325 MG tablet Take 1 tablet by mouth every 6 (six) hours as needed. 30 tablet 0   oxyCODONE-acetaminophen (PERCOCET/ROXICET) 5-325 MG tablet Take 1 tablet by mouth every 6 (six) hours as needed. 12 tablet 0   No current facility-administered medications for this visit.    No Known Allergies   REVIEW OF SYSTEMS:   [X]  denotes positive finding, [ ]  denotes negative finding Cardiac  Comments:  Chest pain or chest pressure:    Shortness of breath upon exertion:    Short of breath when lying flat:    Irregular heart rhythm:        Vascular    Pain in calf, thigh, or hip brought on by ambulation:    Pain in feet at night that wakes you up from your sleep:     Blood clot in your veins:    Leg swelling:         Pulmonary    Oxygen at home:    Productive cough:     Wheezing:          Neurologic    Sudden weakness in arms or legs:     Sudden numbness in arms or legs:     Sudden  onset of difficulty speaking or slurred speech:    Temporary loss of vision in one eye:     Problems with dizziness:         Gastrointestinal    Blood in stool:     Vomited blood:         Genitourinary    Burning when urinating:     Blood in urine:        Psychiatric    Major depression:         Hematologic    Bleeding problems:    Problems with blood clotting too easily:        Skin    Rashes or ulcers:        Constitutional    Fever or chills:      PHYSICAL EXAMINATION:  Vitals:   05/01/23 1352  BP: (!) 149/68  Pulse: 70  Resp: 16  Temp: 98.7 F (37.1 C)  TempSrc: Temporal  SpO2: 98%  Weight: 169 lb 11.2 oz (77 kg)  Height: 6' (1.829 m)    General:  WDWN in NAD; vital signs documented above Gait: Not observed HENT: WNL, normocephalic Pulmonary: normal non-labored breathing , without Rales, rhonchi,  wheezing Cardiac: regular HR Abdomen: soft, NT, no masses Skin: without rashes Vascular Exam/Pulses: 1+ L radial pulse; palpable thrill in basilic vein of upper arm Extremities: without ischemic changes, without Gangrene , without cellulitis; without open wounds;  Musculoskeletal: no muscle wasting or atrophy  Neurologic: A&O X 3 Psychiatric:  The pt has Normal affect.   Non-Invasive Vascular Imaging:   Patent and mature left basilic vein fistula by duplex    ASSESSMENT/PLAN:: 62 y.o. male status post left first stage basilic vein fistula  -Significant symptoms of steal in the left hand starting 1 to 2 weeks after surgery.  He is unable to complete activities of daily living with his left hand given the severity of symptoms.  Patient was evaluated by Dr. Myra Gianotti in the office today.  Plan will be left arm fistula ligation in the operating room tomorrow.  He will continue HD via right IJ TDC.  We will discuss future permanent access as an outpatient.  Surgery  was discussed in detail with the patient and he is agreeable to proceed.  He is also aware he may not achieve full recovery of left hand function after ligation of fistula.   Emilie Rutter, PA-C [facility] [phone]  Clinic MD:   Myra Gianotti

## 2023-05-01 NOTE — Progress Notes (Signed)
Anesthesia Chart Review: Raymond [name]  Case: [id number] Date/Time: 05/02/23 1252   Procedure: LEFT ARM FISTULA LIGATION (Left)   Anesthesia type: Choice   Pre-op diagnosis: ESRD; Steal syndrome   [facility] ROOM 16 / [facility]   Surgeons: Chuck Hint, MD       DISCUSSION: Patient is a 62 year old male scheduled for the above procedure. He is first stage left basilic vein AVF creation on 09/20/12, but evaluated at [facility] on 05/01/23 and reported cold sensation, numbness, tingling, and decreases function in his left hand 1-2 weeks after surgery. 1+ radial pulse, + AVF thrill, no wounds to left hand. Symptoms felt consistent with Steal Syndrome, so ligation of AVF recommended. He is currently dialyzing via right internal jugular TDC at [facility] in [place].    History includes smoking, HTN, HLD, DM2, PAD, carotid artery disease (s/p right TCAR 08/13/21), CKD (Stage IV), OSA, COPD, RLS, skin cancer (melanoma), spinal surgery  (s/p cervical fusion 2000; C3-4 ACDF 07/13/22 for myelopathy), COVID-19 (09/2020).   He had cardiology evaluation in 2022 prior to undergoing right TCAR. He saw Dr. Jacques Navy on 07/08/21 for a preoperative evaluation. Echo that day showed LVEF 50-55% with mild anterior hypokinesis. He subsequently had a stress test on 07/13/21 that was non-ischemic, but Dr. Jacques Navy considered "intermediate risk"--no ischemia on stress images but probable small area of anterior wall infarct at the apex and mid ventricle consistent with his wall motion abnormality. She recommended HTN optimization, and added, "The patient is overall high risk for intermediate risk procedure. Factors contributing to risk include uncontrolled hypertension, baseline renal dysfunction, current smoking, poor functional capacity, and probable prior anterior infarct. However, no ischemia on stress test, therefore no further cardiovascular testing is required prior to the procedure..." He has not been seen by cardiology since his  stress test. He did undergo right TCAR on 08/13/21.    CKD is followed by nephrologist Dr. Wolfgang Phoenix on 11/03/22. Hydralazine started for HTN and referred to vascular surgery for hemodialysis access. Currently medications include amlodipine 10 mg daily, doxazosin 4 mg daily, Lasix 20 mg BID, Toprol XL 50 mg daily, minoxidil 10 mg BID, Pletal 100 mg daily, ASA 81 mg daily, 65 Fe, Tradjenta 5 mg daily, Crestor 20 mg daily, Spiriva, Trazodone 100 mg at bedtime, potassium 99 mg daily. .   Pletal starting 05/01/23.     He is a same day work-up, so he is for updated labs on arrival. Anesthesia team to evaluate on the day of surgery.    VS:  BP Readings from Last 3 Encounters:  05/01/23 (!) 149/68  03/15/23 132/65  03/06/23 (!) 177/85   Pulse Readings from Last 3 Encounters:  05/01/23 70  03/15/23 73  03/06/23 78     PROVIDERS: Juliette Alcide, MD is PCP ([facility]) Weston Brass, MD is cardiologist. Also saw Phillips Hay, RPH-CPP on 08/09/21  in the HTN Clinic. Celso Amy, MD is nephrologist Lemar Livings, MD is vascular surgeon. Last follow-up with APP 02/09/23 with Carotid US. One year follow-up planned. Norwood Levo, MD is GI Windell Norfolk, MD is neurologist   LABS: For day of surgery. Last labs in [facility] include: Lab Results  Component Value Date   WBC 6.6 03/23/2023   HGB 11.0 (L) 03/23/2023   HCT 31.6 (L) 03/23/2023   PLT 165 03/23/2023   GLUCOSE 146 (H) 03/23/2023   NA 125 (L) 03/23/2023   K 3.3 (L) 03/23/2023   CL 92 (L) 03/23/2023   CREATININE  4.18 (H) 03/23/2023   BUN 35 (H) 03/23/2023   CO2 22 03/23/2023  A1c 7.1% on 03/28/22    IMAGES: US Renal 04/07/22: Summary:  Largest Aortic Diameter: 2.5 cm  Renal:  - Right: Normal size right kidney. Abnormal right Resistive Index. Normal cortical thickness of right kidney. RRV flow present. 1-59% stenosis of the right renal artery.  - Left:  Normal size of left kidney. Abnormal left Resisitve  Index. Normal cortical thickness of the left kidney. LRV flow present. 1-59% stenosis of the left renal artery.  Mesenteric:  Normal Celiac artery and Superior Mesenteric artery findings.    CXR 01/16/22 [facility]): FINDINGS:  The heart size and mediastinal contours are within normal limits.  Both lungs are clear. The visualized skeletal structures are  unremarkable. IMPRESSION: No active cardiopulmonary disease.      EKG:  EKG 03/15/23: Sinus rhythm with 1st degree A-V block with occasional Premature ventricular complexes and Fusion complexes Non-specific intra-ventricular conduction block Cannot rule out Anterior infarct , age undetermined Abnormal ECG Confirmed by Lennie Odor [phone]) on 03/15/2023 7:53:52 PM - Partial loss of lead II, III   CV: US Carotid 02/09/23: Summary:  - Right Carotid: Patent stent with. Elevated velocity at the distal stent with no obvious narrowing.  - Left Carotid:  Velocities in left ICA consistent with 1-39% stenosis.  - Vertebrals:  Bilateral vertebral arteries demonstrate antegrade flow.  - Subclavians: Normal flow hemodynamics were seen in bilateral subclavian arteries.    Nuclear stress test 07/13/21: Findings: Negative for stress induced arrhythmias, baseline anterior infarct pattern on ECG. There is an true apical perfusion defect in rest and stress without wall motion abnormality more consistent with apical thinning.   This is less consistent with infarct.   Conclusions: Stress test is negative for ischemia.   Findings are consistent with ischemia. The study is low risk.   No ST deviation was noted.   Left ventricular function is normal. Nuclear stress EF: 61 %. The left ventricular ejection fraction is normal (55-65%). End diastolic cavity size is normal.   Prior study not available for comparison. Reviewed by Dr. Mitzi Hansen wrote, "No ischemia on stress test images. Probable small area of anterior wall infarct at the apex and mid ventricle  which is consistent with known wall motion abnormality on echo and ECG. Stress test is intermediate risk. I have independently reviewed the images. I would start the patient on doxazosin 4 mg daily and refer to [facility] HTN clinic for further optimization in 2 weeks.   The patient is overall high risk for intermediate risk procedure. Factors contributing to risk include uncontrolled hypertension, baseline renal dysfunction, current smoking, poor functional capacity, and probable prior anterior infarct. However, no ischemia on stress test, therefore no further cardiovascular testing is required prior to the procedure. We will attempt to optimize blood pressure in the perioperative period, however suspect risk level is largely non-modifiable prior to surgery. Risk level should be discussed between patient and surgical team prior to proceeding."     Echo 07/08/21: IMPRESSIONS   1. Left ventricular ejection fraction, by estimation, is 50 to 55%. The left ventricle has low normal function. The left ventricle demonstrates regional wall motion abnormalities (see scoring diagram/findings for description). There is mild concentric left ventricular hypertrophy. Left ventricular diastolic parameters were normal. There is mild hypokinesis of the left ventricular, basal-mid anterior wall.   2. Right ventricular systolic function is normal. The right ventricular size is normal.   3. The mitral  valve is normal in structure. Trivial mitral valve regurgitation. No evidence of mitral stenosis.   4. The aortic valve is tricuspid. Aortic valve regurgitation is not visualized. No aortic stenosis is present.   5. The inferior vena cava is normal in size with greater than 50% respiratory variability, suggesting right atrial pressure of 3 mmHg.  - Comparison(s): Prior images unable to be directly viewed, comparison made by report only. Changes from prior study are noted.  - Conclusion(s)/Recommendation(s): Low normal EF, but there  is a focal wall motion abnormalities in the basal to mid anterior wall. No prior images available but this is not noted on prior report.      Past Medical History:  Diagnosis Date   Cancer (HCC)    melanoma   Carotid artery occlusion    Cervical spondylosis with myelopathy    Arthritis   CKD (chronic kidney disease)    stage III (07/2021)   COPD (chronic obstructive pulmonary disease) (HCC) 2022   Diabetes mellitus    Type II   GERD (gastroesophageal reflux disease)    History of blood transfusion 08/2021   2 units- had a hematoma after stent to carotid   Hyperlipidemia    Hypertension    Impotence of organic origin    Insomnia    Peripheral arterial disease (HCC)    Restless leg syndrome    Sleep apnea     Past Surgical History:  Procedure Laterality Date   ANTERIOR CERVICAL DECOMP/DISCECTOMY FUSION N/A 07/13/2022   Procedure: Cervical Three-Four Anterior Cervical Discectomy Fusion with Plate removal;  Surgeon: Coletta Memos, MD;  Location: [facility];  Service: Neurosurgery;  Laterality: N/A;  3C/RM 21 BEFORE DR CABBELLS OTHER CASE IN RM 21   APPENDECTOMY     years ago   AV FISTULA PLACEMENT Left 03/15/2023   Procedure: LEFT ARM FISTULA CREATION;  Surgeon: Nada Libman, MD;  Location: [facility] OR;  Service: Vascular;  Laterality: Left;  with regional block   EYE SURGERY  2017   cataracts   spinal fusion  09/12/1998   SPINE SURGERY  09/12/1998   Cervical Myelopathy- S/P fusion   TRANSCAROTID ARTERY REVASCULARIZATION  Right 08/13/2021   Procedure: RIGHT TRANSCAROTID ARTERY REVASCULARIZATION;  Surgeon: Maeola Harman, MD;  Location: [facility] OR;  Service: Vascular;  Laterality: Right;   ULTRASOUND GUIDANCE FOR VASCULAR ACCESS Left 08/13/2021   Procedure: ULTRASOUND GUIDANCE FOR VASCULAR ACCESS;  Surgeon: Maeola Harman, MD;  Location: [facility] OR;  Service: Vascular;  Laterality: Left;    MEDICATIONS: No current facility-administered medications for this encounter.     acetaminophen (TYLENOL) 500 MG tablet   amLODipine (NORVASC) 10 MG tablet   aspirin 81 MG tablet   cilostazol (PLETAL) 100 MG tablet   doxazosin (CARDURA) 4 MG tablet   famotidine (PEPCID) 20 MG tablet   ferrous sulfate 325 (65 FE) MG EC tablet   fluticasone (FLONASE) 50 MCG/ACT nasal spray   furosemide (LASIX) 20 MG tablet   linagliptin (TRADJENTA) 5 MG TABS tablet   metoprolol succinate (TOPROL-XL) 50 MG 24 hr tablet   minoxidil (LONITEN) 10 MG tablet   oxyCODONE-acetaminophen (PERCOCET/ROXICET) 5-325 MG tablet   oxyCODONE-acetaminophen (PERCOCET/ROXICET) 5-325 MG tablet   polyvinyl alcohol (LIQUIFILM TEARS) 1.4 % ophthalmic solution   Potassium 99 MG TABS   rosuvastatin (CRESTOR) 20 MG tablet   SPIRIVA HANDIHALER 18 MCG inhalation capsule   traZODone (DESYREL) 100 MG tablet    Shonna Chock, PA-C Surgical Short Stay/Anesthesiology [facility] Phone [phone] [facility] Phone [phone] 05/01/2023 3:57 PM

## 2023-05-02 ENCOUNTER — Encounter (HOSPITAL_COMMUNITY): Payer: Self-pay | Admitting: Vascular Surgery

## 2023-05-02 ENCOUNTER — Other Ambulatory Visit: Payer: Self-pay

## 2023-05-02 ENCOUNTER — Ambulatory Visit (HOSPITAL_BASED_OUTPATIENT_CLINIC_OR_DEPARTMENT_OTHER): Payer: Medicare Other | Admitting: Vascular Surgery

## 2023-05-02 ENCOUNTER — Telehealth: Payer: Self-pay | Admitting: Surgery

## 2023-05-02 ENCOUNTER — Ambulatory Visit (HOSPITAL_COMMUNITY)
Admission: RE | Admit: 2023-05-02 | Discharge: 2023-05-02 | Disposition: A | Discharge status: Home / Self Care | Payer: Medicare Other | Attending: Vascular Surgery | Admitting: Vascular Surgery

## 2023-05-02 ENCOUNTER — Ambulatory Visit (HOSPITAL_COMMUNITY): Payer: Medicare Other | Admitting: Vascular Surgery

## 2023-05-02 ENCOUNTER — Encounter (HOSPITAL_COMMUNITY)
Admission: RE | Disposition: A | Discharge status: Home / Self Care | Payer: Self-pay | Source: Home / Self Care | Attending: Vascular Surgery

## 2023-05-02 DIAGNOSIS — N184 Chronic kidney disease, stage 4 (severe): Secondary | ICD-10-CM | POA: Diagnosis not present

## 2023-05-02 DIAGNOSIS — E785 Hyperlipidemia, unspecified: Secondary | ICD-10-CM

## 2023-05-02 DIAGNOSIS — E1122 Type 2 diabetes mellitus with diabetic chronic kidney disease: Secondary | ICD-10-CM | POA: Insufficient documentation

## 2023-05-02 DIAGNOSIS — T82898A Other specified complication of vascular prosthetic devices, implants and grafts, initial encounter: Secondary | ICD-10-CM | POA: Diagnosis not present

## 2023-05-02 DIAGNOSIS — Z992 Dependence on renal dialysis: Secondary | ICD-10-CM | POA: Insufficient documentation

## 2023-05-02 DIAGNOSIS — F17210 Nicotine dependence, cigarettes, uncomplicated: Secondary | ICD-10-CM

## 2023-05-02 DIAGNOSIS — Y838 Other surgical procedures as the cause of abnormal reaction of the patient, or of later complication, without mention of misadventure at the time of the procedure: Secondary | ICD-10-CM | POA: Diagnosis not present

## 2023-05-02 DIAGNOSIS — J449 Chronic obstructive pulmonary disease, unspecified: Secondary | ICD-10-CM | POA: Insufficient documentation

## 2023-05-02 DIAGNOSIS — I129 Hypertensive chronic kidney disease with stage 1 through stage 4 chronic kidney disease, or unspecified chronic kidney disease: Secondary | ICD-10-CM | POA: Insufficient documentation

## 2023-05-02 DIAGNOSIS — N185 Chronic kidney disease, stage 5: Secondary | ICD-10-CM

## 2023-05-02 DIAGNOSIS — N186 End stage renal disease: Secondary | ICD-10-CM

## 2023-05-02 DIAGNOSIS — E1140 Type 2 diabetes mellitus with diabetic neuropathy, unspecified: Secondary | ICD-10-CM | POA: Insufficient documentation

## 2023-05-02 DIAGNOSIS — I10 Essential (primary) hypertension: Secondary | ICD-10-CM | POA: Diagnosis not present

## 2023-05-02 HISTORY — PX: LIGATION OF ARTERIOVENOUS  FISTULA: SHX5948

## 2023-05-02 LAB — GLUCOSE, CAPILLARY
Glucose-Capillary: 126 mg/dL — ABNORMAL HIGH (ref 70–99)
Glucose-Capillary: 112 mg/dL — ABNORMAL HIGH (ref 70–99)
Glucose-Capillary: 114 mg/dL — ABNORMAL HIGH (ref 70–99)

## 2023-05-02 LAB — POCT I-STAT, CHEM 8
BUN: 25 mg/dL — ABNORMAL HIGH (ref 8–23)
Calcium, Ion: 1.11 mmol/L — ABNORMAL LOW (ref 1.15–1.40)
Chloride: 95 mmol/L — ABNORMAL LOW (ref 98–111)
Creatinine, Ser: 4.1 mg/dL — ABNORMAL HIGH (ref 0.61–1.24)
Glucose, Bld: 128 mg/dL — ABNORMAL HIGH (ref 70–99)
HCT: 29 % — ABNORMAL LOW (ref 39.0–52.0)
Hemoglobin: 9.9 g/dL — ABNORMAL LOW (ref 13.0–17.0)
Potassium: 3.7 mmol/L (ref 3.5–5.1)
Sodium: 130 mmol/L — ABNORMAL LOW (ref 135–145)
TCO2: 27 mmol/L (ref 22–32)

## 2023-05-02 SURGERY — LIGATION OF ARTERIOVENOUS  FISTULA
Anesthesia: General | Site: Arm Upper | Laterality: Left

## 2023-05-02 MED ORDER — FENTANYL CITRATE (PF) 250 MCG/5ML IJ SOLN
INTRAMUSCULAR | Status: AC
  Filled 2023-05-02: qty 5

## 2023-05-02 MED ORDER — LIDOCAINE 2% (20 MG/ML) 5 ML SYRINGE
INTRAMUSCULAR | Status: DC | PRN
  Administered 2023-05-02: 100 mg via INTRAVENOUS

## 2023-05-02 MED ORDER — PHENYLEPHRINE 80 MCG/ML (10ML) SYRINGE FOR IV PUSH (FOR BLOOD PRESSURE SUPPORT)
PREFILLED_SYRINGE | INTRAVENOUS | Status: AC
  Filled 2023-05-02: qty 10

## 2023-05-02 MED ORDER — INSULIN ASPART 100 UNIT/ML IJ SOLN: 0.0000 [IU] | INTRAMUSCULAR | Status: DC | PRN

## 2023-05-02 MED ORDER — ONDANSETRON HCL 4 MG/2ML IJ SOLN
INTRAMUSCULAR | Status: AC
  Filled 2023-05-02: qty 2

## 2023-05-02 MED ORDER — ORAL CARE MOUTH RINSE: 15.0000 mL | Freq: Once | OROMUCOSAL | Status: AC

## 2023-05-02 MED ORDER — SODIUM CHLORIDE 0.9 % IV SOLN: INTRAVENOUS | Status: DC

## 2023-05-02 MED ORDER — LIDOCAINE-EPINEPHRINE (PF) 1 %-1:200000 IJ SOLN
INTRAMUSCULAR | Status: DC | PRN
  Administered 2023-05-02: 5 mL

## 2023-05-02 MED ORDER — OXYCODONE HCL 5 MG PO TABS: 5.0000 mg | ORAL_TABLET | Freq: Once | ORAL | Status: DC | PRN

## 2023-05-02 MED ORDER — OXYCODONE-ACETAMINOPHEN 5-325 MG PO TABS: 1.0000 | ORAL_TABLET | ORAL | 0 refills | Status: DC | PRN

## 2023-05-02 MED ORDER — LIDOCAINE 2% (20 MG/ML) 5 ML SYRINGE
INTRAMUSCULAR | Status: AC
  Filled 2023-05-02: qty 5

## 2023-05-02 MED ORDER — EPHEDRINE SULFATE-NACL 50-0.9 MG/10ML-% IV SOSY
PREFILLED_SYRINGE | INTRAVENOUS | Status: DC | PRN
  Administered 2023-05-02 (×3): 5 mg via INTRAVENOUS

## 2023-05-02 MED ORDER — FENTANYL CITRATE (PF) 100 MCG/2ML IJ SOLN: 25.0000 ug | INTRAMUSCULAR | Status: DC | PRN

## 2023-05-02 MED ORDER — ONDANSETRON HCL 4 MG/2ML IJ SOLN: 4.0000 mg | Freq: Once | INTRAMUSCULAR | Status: DC | PRN

## 2023-05-02 MED ORDER — FENTANYL CITRATE (PF) 250 MCG/5ML IJ SOLN
INTRAMUSCULAR | Status: DC | PRN
  Administered 2023-05-02 (×2): 50 ug via INTRAVENOUS

## 2023-05-02 MED ORDER — CHLORHEXIDINE GLUCONATE 0.12 % MT SOLN
15.0000 mL | Freq: Once | OROMUCOSAL | Status: AC
  Administered 2023-05-02: 15 mL via OROMUCOSAL
  Filled 2023-05-02: qty 15

## 2023-05-02 MED ORDER — PROPOFOL 10 MG/ML IV BOLUS
INTRAVENOUS | Status: AC
  Filled 2023-05-02: qty 20

## 2023-05-02 MED ORDER — SUCCINYLCHOLINE CHLORIDE 200 MG/10ML IV SOSY
PREFILLED_SYRINGE | INTRAVENOUS | Status: AC
  Filled 2023-05-02: qty 10

## 2023-05-02 MED ORDER — CEFAZOLIN SODIUM-DEXTROSE 2-4 GM/100ML-% IV SOLN
2.0000 g | INTRAVENOUS | Status: AC
  Administered 2023-05-02: 2 g via INTRAVENOUS
  Filled 2023-05-02: qty 100

## 2023-05-02 MED ORDER — MIDAZOLAM HCL 2 MG/2ML IJ SOLN
INTRAMUSCULAR | Status: AC
  Filled 2023-05-02: qty 2

## 2023-05-02 MED ORDER — DEXAMETHASONE SODIUM PHOSPHATE 10 MG/ML IJ SOLN
INTRAMUSCULAR | Status: DC | PRN
  Administered 2023-05-02: 4 mg via INTRAVENOUS

## 2023-05-02 MED ORDER — PROPOFOL 10 MG/ML IV BOLUS
INTRAVENOUS | Status: DC | PRN
  Administered 2023-05-02: 150 mg via INTRAVENOUS

## 2023-05-02 MED ORDER — MIDAZOLAM HCL 2 MG/2ML IJ SOLN
INTRAMUSCULAR | Status: DC | PRN
  Administered 2023-05-02: 2 mg via INTRAVENOUS

## 2023-05-02 MED ORDER — DEXAMETHASONE SODIUM PHOSPHATE 10 MG/ML IJ SOLN
INTRAMUSCULAR | Status: AC
  Filled 2023-05-02: qty 1

## 2023-05-02 MED ORDER — CHLORHEXIDINE GLUCONATE 4 % EX SOLN: 60.0000 mL | Freq: Once | CUTANEOUS | Status: DC

## 2023-05-02 MED ORDER — OXYCODONE HCL 5 MG/5ML PO SOLN: 5.0000 mg | Freq: Once | ORAL | Status: DC | PRN

## 2023-05-02 MED ORDER — EPHEDRINE 5 MG/ML INJ
INTRAVENOUS | Status: AC
  Filled 2023-05-02: qty 5

## 2023-05-02 MED ORDER — 0.9 % SODIUM CHLORIDE (POUR BTL) OPTIME
TOPICAL | Status: DC | PRN
  Administered 2023-05-02: 1000 mL

## 2023-05-02 MED ORDER — ONDANSETRON HCL 4 MG/2ML IJ SOLN
INTRAMUSCULAR | Status: DC | PRN
  Administered 2023-05-02: 4 mg via INTRAVENOUS

## 2023-05-02 SURGICAL SUPPLY — 33 items
ADH SKN CLS APL DERMABOND .7 (GAUZE/BANDAGES/DRESSINGS) ×1
BAG COUNTER SPONGE SURGICOUNT (BAG) ×1 IMPLANT
BAG SPNG CNTER NS LX DISP (BAG) ×1
CANISTER SUCT 3000ML PPV (MISCELLANEOUS) ×1 IMPLANT
CANNULA VESSEL 3MM 2 BLNT TIP (CANNULA) IMPLANT
CLIP TI MEDIUM 6 (CLIP) ×1 IMPLANT
CLIP TI WIDE RED SMALL 6 (CLIP) ×1 IMPLANT
DERMABOND ADVANCED .7 DNX12 (GAUZE/BANDAGES/DRESSINGS) ×1 IMPLANT
ELECT REM PT RETURN 9FT ADLT (ELECTROSURGICAL) ×1
ELECTRODE REM PT RTRN 9FT ADLT (ELECTROSURGICAL) ×1 IMPLANT
GLOVE BIO SURGEON STRL SZ7.5 (GLOVE) ×1 IMPLANT
GLOVE BIOGEL PI IND STRL 8 (GLOVE) ×1 IMPLANT
GLOVE SURG POLY ORTHO LF SZ7.5 (GLOVE) IMPLANT
GLOVE SURG UNDER LTX SZ8 (GLOVE) ×1 IMPLANT
GOWN STRL REUS W/ TWL LRG LVL3 (GOWN DISPOSABLE) ×3 IMPLANT
GOWN STRL REUS W/TWL LRG LVL3 (GOWN DISPOSABLE) ×3
KIT BASIN OR (CUSTOM PROCEDURE TRAY) ×1 IMPLANT
KIT TURNOVER KIT B (KITS) ×1 IMPLANT
NDL HYPO 25GX1X1/2 BEV (NEEDLE) IMPLANT
NEEDLE HYPO 25GX1X1/2 BEV (NEEDLE) IMPLANT
NS IRRIG 1000ML POUR BTL (IV SOLUTION) ×1 IMPLANT
PACK CV ACCESS (CUSTOM PROCEDURE TRAY) ×1 IMPLANT
PAD ARMBOARD 7.5X6 YLW CONV (MISCELLANEOUS) ×2 IMPLANT
SPONGE SURGIFOAM ABS GEL 100 (HEMOSTASIS) IMPLANT
SUT ETHILON 3 0 PS 1 (SUTURE) IMPLANT
SUT MNCRL AB 4-0 PS2 18 (SUTURE) ×1 IMPLANT
SUT PROLENE 6 0 BV (SUTURE) IMPLANT
SUT SILK 0 TIES 10X30 (SUTURE) ×1 IMPLANT
SUT VIC AB 3-0 SH 27 (SUTURE) ×1
SUT VIC AB 3-0 SH 27X BRD (SUTURE) ×1 IMPLANT
TOWEL GREEN STERILE (TOWEL DISPOSABLE) ×1 IMPLANT
UNDERPAD 30X36 HEAVY ABSORB (UNDERPADS AND DIAPERS) ×1 IMPLANT
WATER STERILE IRR 1000ML POUR (IV SOLUTION) ×1 IMPLANT

## 2023-05-02 NOTE — Anesthesia Procedure Notes (Addendum)
Procedure Name: LMA Insertion Date/Time: 05/02/2023 1:45 PM  Performed by: Camillia Herter, CRNAPre-anesthesia Checklist: Patient identified, Emergency Drugs available, Suction available and Patient being monitored Patient Re-evaluated:Patient Re-evaluated prior to induction Oxygen Delivery Method: Circle System Utilized Preoxygenation: Pre-oxygenation with 100% oxygen Induction Type: IV induction Ventilation: Mask ventilation without difficulty LMA: LMA inserted LMA Size: 5.0 Number of attempts: 1 Airway Equipment and Method: Bite block Placement Confirmation: positive ETCO2 Tube secured with: Tape Dental Injury: Teeth and Oropharynx as per pre-operative assessment

## 2023-05-02 NOTE — Telephone Encounter (Signed)
PT still in hospital

## 2023-05-02 NOTE — Telephone Encounter (Signed)
-----   Message from [facility] sent at 05/02/2023  2:39 PM EDT ----- S/p left basilic fistula ligation 8/20  Please arrange follow up with PA in 2-3 wks for incision check & discuss next access, on [place] office day

## 2023-05-02 NOTE — Transfer of Care (Signed)
Immediate Anesthesia Transfer of Care Note  Patient: Raymond [name]  Procedure(s) Performed: LEFT ARM FISTULA LIGATION (Left: Arm Upper)  Patient Location: PACU  Anesthesia Type:General  Level of Consciousness: drowsy  Airway & Oxygen Therapy: Patient Spontanous Breathing and Patient connected to face mask  Post-op Assessment: Report given to RN and Post -op Vital signs reviewed and stable  Post vital signs: Reviewed and stable  Last Vitals:  Vitals Value Taken Time  BP 144/67 05/02/23 1441  Temp    Pulse 71 05/02/23 1444  Resp 15 05/02/23 1444  SpO2 100 % 05/02/23 1444  Vitals shown include unfiled device data.  Last Pain:  Vitals:   05/02/23 1035  TempSrc:   PainSc: 8          Complications: No notable events documented.

## 2023-05-02 NOTE — Anesthesia Postprocedure Evaluation (Signed)
Anesthesia Post Note  Patient: PAOLO DUBON  Procedure(s) Performed: LEFT ARM FISTULA LIGATION (Left: Arm Upper)     Patient location during evaluation: PACU Anesthesia Type: General Level of consciousness: awake and alert Pain management: pain level controlled Vital Signs Assessment: post-procedure vital signs reviewed and stable Respiratory status: spontaneous breathing, nonlabored ventilation, respiratory function stable and patient connected to nasal cannula oxygen Cardiovascular status: blood pressure returned to baseline and stable Postop Assessment: no apparent nausea or vomiting Anesthetic complications: no  No notable events documented.  Last Vitals:  Vitals:   05/02/23 1500 05/02/23 1515  BP: (!) 147/68 (!) 164/76  Pulse: 70 72  Resp: 16 18  Temp:  36.4 C  SpO2: 94% 95%    Last Pain:  Vitals:   05/02/23 1500  TempSrc:   PainSc: 0-No pain                 Carla Whilden S

## 2023-05-02 NOTE — Op Note (Signed)
    NAME: Raymond [name]    MRN: [id number] DOB: 1960-12-25    DATE OF OPERATION: 05/02/2023  PREOP DIAGNOSIS:    Steal syndrome left upper extremity  POSTOP DIAGNOSIS:    Same  PROCEDURE:    Ligation of left upper arm fistula  SURGEON: Di Kindle. Edilia Bo, MD  ASSIST: Kayren Eaves, PA  ANESTHESIA: General  EBL: Minimal  INDICATIONS:    Raymond [name] is a 62 y.o. male who had a for stage basilic vein transposition placed in the left arm.  He subsequently developed significant steal symptoms in his left arm and was set up for ligation.  FINDINGS:   Excellent radial and ulnar signal with the Doppler at the completion of the procedure  TECHNIQUE:   The patient was taken to the operating room and received a general anesthetic.  The left arm was prepped and draped in usual sterile fashion.  The previous incision in the left arm was opened.  The dissection was carried down through scar tissue to identify the vein.  I dissected this down to where it was anastomosed to the brachial artery.  The vein was ligated with two 2-0 silk ties.  At the completion there was an excellent radial and ulnar signal with the Doppler.  Hemostasis was obtained in the wound.  The wound was closed with a deep layer of 3-0 Vicryl and the skin closed with 4-0 Monocryl.  Dermabond was applied.  The patient tolerated the seizure well was transferred to the recovery room in stable condition.  All needle and sponge counts were correct.  Given the complexity of the case a first assistant was necessary in order to expedient the procedure and safely perform the technical aspects of the operation.  [name], MD, FACS [facility]  DATE OF DICTATION:   05/02/2023

## 2023-05-02 NOTE — Interval H&P Note (Signed)
History and Physical Interval Note:  05/02/2023 1:07 PM  Raymond [name]  has presented today for surgery, with the diagnosis of ESRD; Steal syndrome.  The various methods of treatment have been discussed with the patient and family. After consideration of risks, benefits and other options for treatment, the patient has consented to  Procedure(s): LEFT ARM FISTULA LIGATION (Left) as a surgical intervention.  The patient's history has been reviewed, patient examined, no change in status, stable for surgery.  I have reviewed the patient's chart and labs.  Questions were answered to the patient's satisfaction.     [name]

## 2023-05-03 ENCOUNTER — Telehealth: Payer: Self-pay

## 2023-05-03 ENCOUNTER — Encounter (HOSPITAL_COMMUNITY): Payer: Self-pay | Admitting: Vascular Surgery

## 2023-05-03 NOTE — Telephone Encounter (Signed)
Returned call to pt in regards to L hand swelling post surgery on 05/02/23. Pt stated that his L hand is swollen, denies pain, color changes or temperature changes. Pt states that he has not been elevating his arm. When asked about the incision pt stated it was not draining and looked fine. I advised pt to elevate his L arm above the level of the heart as much as possible to help alleviate the swelling. I advised that if swelling persist and if he develops pain, color changes or temperature changes to the L arm or digits to give our office a call or go to the nearest ED. Pt verbalized understanding and agreed with this plan.

## 2023-05-04 DIAGNOSIS — N250 Renal osteodystrophy: Secondary | ICD-10-CM | POA: Diagnosis not present

## 2023-05-04 DIAGNOSIS — Z992 Dependence on renal dialysis: Secondary | ICD-10-CM | POA: Diagnosis not present

## 2023-05-04 DIAGNOSIS — N186 End stage renal disease: Secondary | ICD-10-CM | POA: Diagnosis not present

## 2023-05-04 DIAGNOSIS — D509 Iron deficiency anemia, unspecified: Secondary | ICD-10-CM | POA: Diagnosis not present

## 2023-05-04 DIAGNOSIS — Z23 Encounter for immunization: Secondary | ICD-10-CM | POA: Diagnosis not present

## 2023-05-04 NOTE — Telephone Encounter (Signed)
Pt appt scheduled

## 2023-05-06 DIAGNOSIS — Z23 Encounter for immunization: Secondary | ICD-10-CM | POA: Diagnosis not present

## 2023-05-06 DIAGNOSIS — N186 End stage renal disease: Secondary | ICD-10-CM | POA: Diagnosis not present

## 2023-05-06 DIAGNOSIS — D509 Iron deficiency anemia, unspecified: Secondary | ICD-10-CM | POA: Diagnosis not present

## 2023-05-06 DIAGNOSIS — N250 Renal osteodystrophy: Secondary | ICD-10-CM | POA: Diagnosis not present

## 2023-05-06 DIAGNOSIS — Z992 Dependence on renal dialysis: Secondary | ICD-10-CM | POA: Diagnosis not present

## 2023-05-08 ENCOUNTER — Ambulatory Visit (INDEPENDENT_AMBULATORY_CARE_PROVIDER_SITE_OTHER): Payer: Medicare Other | Admitting: Physician Assistant

## 2023-05-08 VITALS — BP 128/58 | HR 78 | Temp 98.3°F | Resp 18 | Ht 72.0 in | Wt 171.5 lb

## 2023-05-08 DIAGNOSIS — Z992 Dependence on renal dialysis: Secondary | ICD-10-CM

## 2023-05-08 DIAGNOSIS — N186 End stage renal disease: Secondary | ICD-10-CM

## 2023-05-08 DIAGNOSIS — T82898A Other specified complication of vascular prosthetic devices, implants and grafts, initial encounter: Secondary | ICD-10-CM

## 2023-05-08 NOTE — Progress Notes (Signed)
POST OPERATIVE OFFICE NOTE    CC:  F/u for surgery  HPI:  This is a 62 y.o. male who is s/p ligation of left basilic vein fistula on 05/02/2023 by Dr. Durwin Nora due to severe steal symptoms of left hand.  The patient developed swelling of the left hand and arm several days after the surgery.  Swelling has continued to worsen.  He continues to dialyze via right IJ TDC at [facility] in [place].  He does not take a blood thinner.  No Known Allergies  Current Outpatient Medications  Medication Sig Dispense Refill   acetaminophen (TYLENOL) 500 MG tablet Take 500 mg by mouth every 6 (six) hours as needed for moderate pain.     amLODipine (NORVASC) 10 MG tablet Take 10 mg by mouth daily.     aspirin 81 MG tablet Take 81 mg by mouth daily.     cilostazol (PLETAL) 100 MG tablet TAKE 1 TABLET 2 TIMES A DAY BEFORE A MEAL. 60 tablet 11   doxazosin (CARDURA) 4 MG tablet Take 1 tablet (4 mg total) by mouth daily. 30 tablet 3   famotidine (PEPCID) 20 MG tablet Take 20 mg by mouth daily as needed for heartburn or indigestion.     ferrous sulfate 325 (65 FE) MG EC tablet Take 1 tablet (325 mg total) by mouth daily with breakfast. 30 tablet 0   fluticasone (FLONASE) 50 MCG/ACT nasal spray Place 1 spray into both nostrils daily.     furosemide (LASIX) 20 MG tablet Take 20 mg by mouth 2 (two) times daily.     linagliptin (TRADJENTA) 5 MG TABS tablet Take 5 mg by mouth daily.     metoprolol succinate (TOPROL-XL) 50 MG 24 hr tablet Take 50 mg by mouth daily.     minoxidil (LONITEN) 10 MG tablet Take 10 mg by mouth 2 (two) times daily.     oxyCODONE-acetaminophen (PERCOCET) 5-325 MG tablet Take 1 tablet by mouth every 4 (four) hours as needed for severe pain. 12 tablet 0   polyvinyl alcohol (LIQUIFILM TEARS) 1.4 % ophthalmic solution Place 1 drop into both eyes as needed for dry eyes.     Potassium 99 MG TABS Take 99 mg by mouth daily.     rosuvastatin (CRESTOR) 20 MG tablet Take 2 tablets (40 mg total) by mouth daily.  (Patient taking differently: Take 20 mg by mouth daily.)     SPIRIVA HANDIHALER 18 MCG inhalation capsule Place 18 mcg into inhaler and inhale daily.     traZODone (DESYREL) 100 MG tablet Take 100 mg by mouth at bedtime.     No current facility-administered medications for this visit.     ROS:  See HPI  Physical Exam:  Vitals:   05/08/23 1358  BP: (!) 128/58  Pulse: 78  Resp: 18  Temp: 98.3 F (36.8 C)  TempSrc: Oral  SpO2: 96%  Weight: 171 lb 8 oz (77.8 kg)  Height: 6' (1.829 m)    Incision: Left arm incision is well-appearing Extremities: Pitting edema of the left hand up to the elbow with a soft but full collection at the incision; 2+ palpable left radial pulse Neuro: A&O  Assessment/Plan:  This is a 62 y.o. male who is s/p: Ligation of left basilic vein fistula  -Mr. Jaymar Scarpaci is a 62 year old male who developed severe steal symptoms in his left hand after creation of basilic vein fistula.  He underwent ligation of his fistula on 05/02/2023.  Several days after surgery he developed pitting  edema of the left hand all the way up to the elbow.  On exam he seems to have a soft but full fluid collection at the incision.  There is no skin compromise or firm hematoma currently.  He however has a strong 2+ palpable left radial pulse which was not present prior to fistula ligation.  He states that is difficult to know if his steal symptoms have improved since fistula ligation due to the degree of edema in his hand.  We wrapped his arm from hand to the distal upper arm with an Ace wrap.  He will also elevate his arm as much as possible during the day and exercise his hand with a stress ball.  He will return to office at his normally scheduled postoperative appointment in 2 weeks.  He will call/return office sooner if edema fails to improve.  He can continue HD via right IJ TDC.   Emilie Rutter, PA-C [facility] [phone]  Clinic MD:  Myra Gianotti

## 2023-05-09 DIAGNOSIS — N186 End stage renal disease: Secondary | ICD-10-CM | POA: Diagnosis not present

## 2023-05-09 DIAGNOSIS — Z23 Encounter for immunization: Secondary | ICD-10-CM | POA: Diagnosis not present

## 2023-05-09 DIAGNOSIS — D509 Iron deficiency anemia, unspecified: Secondary | ICD-10-CM | POA: Diagnosis not present

## 2023-05-09 DIAGNOSIS — N250 Renal osteodystrophy: Secondary | ICD-10-CM | POA: Diagnosis not present

## 2023-05-09 DIAGNOSIS — Z992 Dependence on renal dialysis: Secondary | ICD-10-CM | POA: Diagnosis not present

## 2023-05-11 DIAGNOSIS — D509 Iron deficiency anemia, unspecified: Secondary | ICD-10-CM | POA: Diagnosis not present

## 2023-05-11 DIAGNOSIS — N250 Renal osteodystrophy: Secondary | ICD-10-CM | POA: Diagnosis not present

## 2023-05-11 DIAGNOSIS — N186 End stage renal disease: Secondary | ICD-10-CM | POA: Diagnosis not present

## 2023-05-11 DIAGNOSIS — Z23 Encounter for immunization: Secondary | ICD-10-CM | POA: Diagnosis not present

## 2023-05-11 DIAGNOSIS — Z992 Dependence on renal dialysis: Secondary | ICD-10-CM | POA: Diagnosis not present

## 2023-05-13 DIAGNOSIS — Z23 Encounter for immunization: Secondary | ICD-10-CM | POA: Diagnosis not present

## 2023-05-13 DIAGNOSIS — N250 Renal osteodystrophy: Secondary | ICD-10-CM | POA: Diagnosis not present

## 2023-05-13 DIAGNOSIS — N186 End stage renal disease: Secondary | ICD-10-CM | POA: Diagnosis not present

## 2023-05-13 DIAGNOSIS — Z992 Dependence on renal dialysis: Secondary | ICD-10-CM | POA: Diagnosis not present

## 2023-05-13 DIAGNOSIS — D509 Iron deficiency anemia, unspecified: Secondary | ICD-10-CM | POA: Diagnosis not present

## 2023-05-14 DIAGNOSIS — N186 End stage renal disease: Secondary | ICD-10-CM | POA: Diagnosis not present

## 2023-05-14 DIAGNOSIS — Z992 Dependence on renal dialysis: Secondary | ICD-10-CM | POA: Diagnosis not present

## 2023-05-16 DIAGNOSIS — Z992 Dependence on renal dialysis: Secondary | ICD-10-CM | POA: Diagnosis not present

## 2023-05-16 DIAGNOSIS — T8189XA Other complications of procedures, not elsewhere classified, initial encounter: Secondary | ICD-10-CM | POA: Diagnosis not present

## 2023-05-16 DIAGNOSIS — N186 End stage renal disease: Secondary | ICD-10-CM | POA: Diagnosis not present

## 2023-05-16 DIAGNOSIS — T82898A Other specified complication of vascular prosthetic devices, implants and grafts, initial encounter: Secondary | ICD-10-CM | POA: Diagnosis not present

## 2023-05-16 DIAGNOSIS — N250 Renal osteodystrophy: Secondary | ICD-10-CM | POA: Diagnosis not present

## 2023-05-16 DIAGNOSIS — Z23 Encounter for immunization: Secondary | ICD-10-CM | POA: Diagnosis not present

## 2023-05-16 DIAGNOSIS — D509 Iron deficiency anemia, unspecified: Secondary | ICD-10-CM | POA: Diagnosis not present

## 2023-05-18 ENCOUNTER — Telehealth: Payer: Self-pay

## 2023-05-18 DIAGNOSIS — T8189XA Other complications of procedures, not elsewhere classified, initial encounter: Secondary | ICD-10-CM | POA: Diagnosis not present

## 2023-05-18 DIAGNOSIS — N186 End stage renal disease: Secondary | ICD-10-CM | POA: Diagnosis not present

## 2023-05-18 DIAGNOSIS — D509 Iron deficiency anemia, unspecified: Secondary | ICD-10-CM | POA: Diagnosis not present

## 2023-05-18 DIAGNOSIS — Z23 Encounter for immunization: Secondary | ICD-10-CM | POA: Diagnosis not present

## 2023-05-18 DIAGNOSIS — Z992 Dependence on renal dialysis: Secondary | ICD-10-CM | POA: Diagnosis not present

## 2023-05-18 DIAGNOSIS — T82898A Other specified complication of vascular prosthetic devices, implants and grafts, initial encounter: Secondary | ICD-10-CM | POA: Diagnosis not present

## 2023-05-18 NOTE — Telephone Encounter (Signed)
Caller: Pt's wife, Dois Davenport  Concern: severe pain in hand, persistent swelling of arm and hand, fluid collection noted at last visit has grown in size  Location: left arm, hand  Description:  worsening x 2 weeks  Quality: aching and throbbing  Treatments:  ACE wrap, elevation  Procedure: Dialysis Access Surgery  Resolution:  Pt has scheduled appt on 9/9, but pain is becoming unbearable and appt for 9/6 offered  Next Appt: Pt will call after HD today to confirm appt for 9/6 or to keep 9/9, appt slot saved

## 2023-05-19 ENCOUNTER — Ambulatory Visit (INDEPENDENT_AMBULATORY_CARE_PROVIDER_SITE_OTHER): Payer: Medicare Other | Admitting: Physician Assistant

## 2023-05-19 VITALS — BP 151/68 | HR 75 | Temp 97.7°F | Wt 166.0 lb

## 2023-05-19 DIAGNOSIS — Z992 Dependence on renal dialysis: Secondary | ICD-10-CM

## 2023-05-19 DIAGNOSIS — T82898A Other specified complication of vascular prosthetic devices, implants and grafts, initial encounter: Secondary | ICD-10-CM

## 2023-05-19 DIAGNOSIS — N186 End stage renal disease: Secondary | ICD-10-CM

## 2023-05-19 MED ORDER — GABAPENTIN 300 MG PO CAPS: 300.0000 mg | ORAL_CAPSULE | Freq: Every day | ORAL | 3 refills | Status: DC

## 2023-05-19 NOTE — Progress Notes (Signed)
POST OPERATIVE OFFICE NOTE    CC:  F/u for surgery  HPI:  This is a 62 y.o. male who is s/p left arm basilic vein fistula creation by Dr. Myra Gianotti on 03/15/2023.  He developed severe steal symptoms in his left hand however did not follow-up in office until 5 to 6 weeks postoperatively.  He underwent fistula ligation on 05/02/2023 with Dr. Edilia Bo.  He is still complaining of numbness in his left hand.  He also however has developed pins-and-needles feeling with sharp shooting pains in his fingers.  He also still has swelling in his left elbow and hand however this has improved since last office visit when Ace wrap and arm elevation was recommended.  He continues to dialyze via right IJ TDC  No Known Allergies  Current Outpatient Medications  Medication Sig Dispense Refill   acetaminophen (TYLENOL) 500 MG tablet Take 500 mg by mouth every 6 (six) hours as needed for moderate pain.     amLODipine (NORVASC) 10 MG tablet Take 10 mg by mouth daily.     aspirin 81 MG tablet Take 81 mg by mouth daily.     cilostazol (PLETAL) 100 MG tablet TAKE 1 TABLET 2 TIMES A DAY BEFORE A MEAL. 60 tablet 11   doxazosin (CARDURA) 4 MG tablet Take 1 tablet (4 mg total) by mouth daily. 30 tablet 3   famotidine (PEPCID) 20 MG tablet Take 20 mg by mouth daily as needed for heartburn or indigestion.     ferrous sulfate 325 (65 FE) MG EC tablet Take 1 tablet (325 mg total) by mouth daily with breakfast. 30 tablet 0   fluticasone (FLONASE) 50 MCG/ACT nasal spray Place 1 spray into both nostrils daily.     furosemide (LASIX) 20 MG tablet Take 20 mg by mouth 2 (two) times daily.     linagliptin (TRADJENTA) 5 MG TABS tablet Take 5 mg by mouth daily.     metoprolol succinate (TOPROL-XL) 50 MG 24 hr tablet Take 50 mg by mouth daily.     minoxidil (LONITEN) 10 MG tablet Take 10 mg by mouth 2 (two) times daily.     oxyCODONE-acetaminophen (PERCOCET) 5-325 MG tablet Take 1 tablet by mouth every 4 (four) hours as needed for severe  pain. 12 tablet 0   polyvinyl alcohol (LIQUIFILM TEARS) 1.4 % ophthalmic solution Place 1 drop into both eyes as needed for dry eyes.     Potassium 99 MG TABS Take 99 mg by mouth daily.     rosuvastatin (CRESTOR) 20 MG tablet Take 2 tablets (40 mg total) by mouth daily. (Patient taking differently: Take 20 mg by mouth daily.)     SPIRIVA HANDIHALER 18 MCG inhalation capsule Place 18 mcg into inhaler and inhale daily.     traZODone (DESYREL) 100 MG tablet Take 100 mg by mouth at bedtime.     No current facility-administered medications for this visit.     ROS:  See HPI  Physical Exam:  Vitals:   05/19/23 1254  BP: (!) 151/68  Pulse: 75  Temp: 97.7 F (36.5 C)  TempSrc: Temporal  SpO2: 97%  Weight: 166 lb (75.3 kg)    Incision: Left arm incision well-healed with soft fluid collection, no drainage or redness; edema in left hand and forearm Extremities: 2+ palpable left radial pulse; 3 out of 5 grip strength  Assessment/Plan:  This is a 62 y.o. male who is s/p: Ligation of left basilic vein fistula due to severe steal symptoms in the left  hand  Mr. Maruin Embree is a 62 year old male who experienced severe steal symptoms of left hand after brachiobasilic fistula creation.  Unfortunately he followed up 5 to 6 weeks later in the office.  He was then scheduled for an urgent ligation of his fistula.  He continues to have numbness in his hand however has also developed pins-and-needles feeling in his fingers.  I mentioned to him that this is encouraging as this may represent healing of the nerves in his fingertips.  He has a small fluid collection at the incision which is more soft by my exam today.  He will continue wrapping his hand and forearm with an Ace wrap and elevating his arm when possible during the day.  His grip strength in his left hand has also improved since last office visit.  We will add gabapentin to take at night to help blunt his nerve pain in the hand.  We will also refer  him to occupational therapy for exercises in addition to stress ball use.  He will follow-up with Dr. Myra Gianotti in several months to check on progress.  We may consider a neurology referral for nerve conduction studies.  For now he will continue use of right IJ [facility] for dialysis.   Emilie Rutter, PA-C [facility] [phone]  Clinic MD:  Karin Lieu

## 2023-05-19 NOTE — Telephone Encounter (Signed)
Pt's wife called back, but it was after hours.  Pt's wife called to confirm that the pt needed the appt today that was saved for him yesterday at 1245. Appt scheduled. Confirmed understanding.

## 2023-05-20 DIAGNOSIS — Z992 Dependence on renal dialysis: Secondary | ICD-10-CM | POA: Diagnosis not present

## 2023-05-20 DIAGNOSIS — D509 Iron deficiency anemia, unspecified: Secondary | ICD-10-CM | POA: Diagnosis not present

## 2023-05-20 DIAGNOSIS — T82898A Other specified complication of vascular prosthetic devices, implants and grafts, initial encounter: Secondary | ICD-10-CM | POA: Diagnosis not present

## 2023-05-20 DIAGNOSIS — T8189XA Other complications of procedures, not elsewhere classified, initial encounter: Secondary | ICD-10-CM | POA: Diagnosis not present

## 2023-05-20 DIAGNOSIS — Z23 Encounter for immunization: Secondary | ICD-10-CM | POA: Diagnosis not present

## 2023-05-20 DIAGNOSIS — N186 End stage renal disease: Secondary | ICD-10-CM | POA: Diagnosis not present

## 2023-05-23 DIAGNOSIS — D509 Iron deficiency anemia, unspecified: Secondary | ICD-10-CM | POA: Diagnosis not present

## 2023-05-23 DIAGNOSIS — N186 End stage renal disease: Secondary | ICD-10-CM | POA: Diagnosis not present

## 2023-05-23 DIAGNOSIS — Z992 Dependence on renal dialysis: Secondary | ICD-10-CM | POA: Diagnosis not present

## 2023-05-23 DIAGNOSIS — T82898A Other specified complication of vascular prosthetic devices, implants and grafts, initial encounter: Secondary | ICD-10-CM | POA: Diagnosis not present

## 2023-05-23 DIAGNOSIS — Z23 Encounter for immunization: Secondary | ICD-10-CM | POA: Diagnosis not present

## 2023-05-23 DIAGNOSIS — T8189XA Other complications of procedures, not elsewhere classified, initial encounter: Secondary | ICD-10-CM | POA: Diagnosis not present

## 2023-05-25 ENCOUNTER — Telehealth: Payer: Self-pay

## 2023-05-25 DIAGNOSIS — Z23 Encounter for immunization: Secondary | ICD-10-CM | POA: Diagnosis not present

## 2023-05-25 DIAGNOSIS — D509 Iron deficiency anemia, unspecified: Secondary | ICD-10-CM | POA: Diagnosis not present

## 2023-05-25 DIAGNOSIS — Z992 Dependence on renal dialysis: Secondary | ICD-10-CM | POA: Diagnosis not present

## 2023-05-25 DIAGNOSIS — T82898A Other specified complication of vascular prosthetic devices, implants and grafts, initial encounter: Secondary | ICD-10-CM | POA: Diagnosis not present

## 2023-05-25 DIAGNOSIS — N186 End stage renal disease: Secondary | ICD-10-CM | POA: Diagnosis not present

## 2023-05-25 DIAGNOSIS — T8189XA Other complications of procedures, not elsewhere classified, initial encounter: Secondary | ICD-10-CM | POA: Diagnosis not present

## 2023-05-25 NOTE — Telephone Encounter (Signed)
Pt's wife called to request lower dose Gabapentin rx. Pt was recently put on 300 mg and can't tolerate it. HD center suggested that he try starting with 100 mg/daily. Staff message sent to APP with this request.

## 2023-05-26 ENCOUNTER — Other Ambulatory Visit: Payer: Self-pay | Admitting: Physician Assistant

## 2023-05-26 MED ORDER — GABAPENTIN 100 MG PO CAPS: 100.0000 mg | ORAL_CAPSULE | Freq: Every day | ORAL | 0 refills | Status: DC

## 2023-05-27 DIAGNOSIS — N186 End stage renal disease: Secondary | ICD-10-CM | POA: Diagnosis not present

## 2023-05-27 DIAGNOSIS — Z23 Encounter for immunization: Secondary | ICD-10-CM | POA: Diagnosis not present

## 2023-05-27 DIAGNOSIS — T82898A Other specified complication of vascular prosthetic devices, implants and grafts, initial encounter: Secondary | ICD-10-CM | POA: Diagnosis not present

## 2023-05-27 DIAGNOSIS — Z992 Dependence on renal dialysis: Secondary | ICD-10-CM | POA: Diagnosis not present

## 2023-05-27 DIAGNOSIS — T8189XA Other complications of procedures, not elsewhere classified, initial encounter: Secondary | ICD-10-CM | POA: Diagnosis not present

## 2023-05-27 DIAGNOSIS — D509 Iron deficiency anemia, unspecified: Secondary | ICD-10-CM | POA: Diagnosis not present

## 2023-05-30 DIAGNOSIS — Z23 Encounter for immunization: Secondary | ICD-10-CM | POA: Diagnosis not present

## 2023-05-30 DIAGNOSIS — T82898A Other specified complication of vascular prosthetic devices, implants and grafts, initial encounter: Secondary | ICD-10-CM | POA: Diagnosis not present

## 2023-05-30 DIAGNOSIS — D509 Iron deficiency anemia, unspecified: Secondary | ICD-10-CM | POA: Diagnosis not present

## 2023-05-30 DIAGNOSIS — T8189XA Other complications of procedures, not elsewhere classified, initial encounter: Secondary | ICD-10-CM | POA: Diagnosis not present

## 2023-05-30 DIAGNOSIS — Z992 Dependence on renal dialysis: Secondary | ICD-10-CM | POA: Diagnosis not present

## 2023-05-30 DIAGNOSIS — N186 End stage renal disease: Secondary | ICD-10-CM | POA: Diagnosis not present

## 2023-06-01 DIAGNOSIS — D509 Iron deficiency anemia, unspecified: Secondary | ICD-10-CM | POA: Diagnosis not present

## 2023-06-01 DIAGNOSIS — N186 End stage renal disease: Secondary | ICD-10-CM | POA: Diagnosis not present

## 2023-06-01 DIAGNOSIS — Z992 Dependence on renal dialysis: Secondary | ICD-10-CM | POA: Diagnosis not present

## 2023-06-01 DIAGNOSIS — T82898A Other specified complication of vascular prosthetic devices, implants and grafts, initial encounter: Secondary | ICD-10-CM | POA: Diagnosis not present

## 2023-06-01 DIAGNOSIS — Z23 Encounter for immunization: Secondary | ICD-10-CM | POA: Diagnosis not present

## 2023-06-01 DIAGNOSIS — T8189XA Other complications of procedures, not elsewhere classified, initial encounter: Secondary | ICD-10-CM | POA: Diagnosis not present

## 2023-06-03 DIAGNOSIS — T82898A Other specified complication of vascular prosthetic devices, implants and grafts, initial encounter: Secondary | ICD-10-CM | POA: Diagnosis not present

## 2023-06-03 DIAGNOSIS — Z992 Dependence on renal dialysis: Secondary | ICD-10-CM | POA: Diagnosis not present

## 2023-06-03 DIAGNOSIS — N186 End stage renal disease: Secondary | ICD-10-CM | POA: Diagnosis not present

## 2023-06-03 DIAGNOSIS — D509 Iron deficiency anemia, unspecified: Secondary | ICD-10-CM | POA: Diagnosis not present

## 2023-06-03 DIAGNOSIS — Z23 Encounter for immunization: Secondary | ICD-10-CM | POA: Diagnosis not present

## 2023-06-03 DIAGNOSIS — T8189XA Other complications of procedures, not elsewhere classified, initial encounter: Secondary | ICD-10-CM | POA: Diagnosis not present

## 2023-06-06 ENCOUNTER — Other Ambulatory Visit: Payer: Self-pay | Admitting: *Deleted

## 2023-06-06 DIAGNOSIS — T82898A Other specified complication of vascular prosthetic devices, implants and grafts, initial encounter: Secondary | ICD-10-CM | POA: Diagnosis not present

## 2023-06-06 DIAGNOSIS — Z992 Dependence on renal dialysis: Secondary | ICD-10-CM | POA: Diagnosis not present

## 2023-06-06 DIAGNOSIS — N186 End stage renal disease: Secondary | ICD-10-CM

## 2023-06-06 DIAGNOSIS — D509 Iron deficiency anemia, unspecified: Secondary | ICD-10-CM | POA: Diagnosis not present

## 2023-06-06 DIAGNOSIS — Z23 Encounter for immunization: Secondary | ICD-10-CM | POA: Diagnosis not present

## 2023-06-06 DIAGNOSIS — T8189XA Other complications of procedures, not elsewhere classified, initial encounter: Secondary | ICD-10-CM | POA: Diagnosis not present

## 2023-06-07 ENCOUNTER — Ambulatory Visit (INDEPENDENT_AMBULATORY_CARE_PROVIDER_SITE_OTHER): Payer: Medicare Other | Admitting: Physician Assistant

## 2023-06-07 ENCOUNTER — Ambulatory Visit (HOSPITAL_COMMUNITY)
Admission: RE | Admit: 2023-06-07 | Discharge: 2023-06-07 | Disposition: A | Discharge status: Home / Self Care | Payer: Medicare Other | Source: Ambulatory Visit | Attending: Vascular Surgery | Admitting: Vascular Surgery

## 2023-06-07 ENCOUNTER — Encounter: Payer: Self-pay | Admitting: Physician Assistant

## 2023-06-07 VITALS — BP 158/64 | HR 65 | Temp 98.7°F | Ht 72.0 in | Wt 169.0 lb

## 2023-06-07 DIAGNOSIS — Z992 Dependence on renal dialysis: Secondary | ICD-10-CM

## 2023-06-07 DIAGNOSIS — N186 End stage renal disease: Secondary | ICD-10-CM

## 2023-06-07 DIAGNOSIS — T148XXA Other injury of unspecified body region, initial encounter: Secondary | ICD-10-CM

## 2023-06-07 MED ORDER — CEPHALEXIN 500 MG PO CAPS: 500.0000 mg | ORAL_CAPSULE | Freq: Three times a day (TID) | ORAL | 0 refills | Status: DC

## 2023-06-07 NOTE — Progress Notes (Signed)
POST OPERATIVE OFFICE NOTE    CC:  F/u for surgery  HPI:  Raymond [name] is a 62 y.o. male who is s/p left arm basilic vein fistula creation by Dr. Myra Gianotti on 03/15/2023.  He developed severe steal symptoms in his left hand however did not follow-up with our office for 5 to 6 weeks.  He underwent fistula ligation on 05/02/2023 by Dr. Edilia Bo.  Since ligation he has dealt with various issues in his left arm and hand including sharp shooting pains, swelling, and numbness.  He has also had a soft, nonerythematous fluid collection around his incision site likely due to a seroma.   He returns today as a triage visit.  He states he is still dealing with left hand numbness, tingling, and sharp shooting pains at his fingertips.  This has not improved much since his last office visit 3 weeks ago.  He is waiting to see neurology and undergo nerve conduction studies.  His bigger concern for today's office visit was changes to incision site.  Him and his wife feel like the fluid collection in his incision got bigger over the weekend.  This area also started turning red and purple over the weekend.  This has not caused him any pain.  He denies any fevers.   No Known Allergies  Current Outpatient Medications  Medication Sig Dispense Refill   acetaminophen (TYLENOL) 500 MG tablet Take 500 mg by mouth every 6 (six) hours as needed for moderate pain.     amLODipine (NORVASC) 10 MG tablet Take 10 mg by mouth daily.     aspirin 81 MG tablet Take 81 mg by mouth daily.     cilostazol (PLETAL) 100 MG tablet TAKE 1 TABLET 2 TIMES A DAY BEFORE A MEAL. 60 tablet 11   doxazosin (CARDURA) 4 MG tablet Take 1 tablet (4 mg total) by mouth daily. 30 tablet 3   famotidine (PEPCID) 20 MG tablet Take 20 mg by mouth daily as needed for heartburn or indigestion.     ferrous sulfate 325 (65 FE) MG EC tablet Take 1 tablet (325 mg total) by mouth daily with breakfast. 30 tablet 0   fluticasone (FLONASE) 50 MCG/ACT nasal spray Place 1  spray into both nostrils daily.     furosemide (LASIX) 20 MG tablet Take 20 mg by mouth 2 (two) times daily.     gabapentin (NEURONTIN) 100 MG capsule Take 1 capsule (100 mg total) by mouth at bedtime. 30 capsule 0   linagliptin (TRADJENTA) 5 MG TABS tablet Take 5 mg by mouth daily.     metoprolol succinate (TOPROL-XL) 50 MG 24 hr tablet Take 50 mg by mouth daily.     minoxidil (LONITEN) 10 MG tablet Take 10 mg by mouth 2 (two) times daily.     oxyCODONE-acetaminophen (PERCOCET) 5-325 MG tablet Take 1 tablet by mouth every 4 (four) hours as needed for severe pain. 12 tablet 0   polyvinyl alcohol (LIQUIFILM TEARS) 1.4 % ophthalmic solution Place 1 drop into both eyes as needed for dry eyes.     Potassium 99 MG TABS Take 99 mg by mouth daily.     rosuvastatin (CRESTOR) 20 MG tablet Take 2 tablets (40 mg total) by mouth daily. (Patient taking differently: Take 20 mg by mouth daily.)     SPIRIVA HANDIHALER 18 MCG inhalation capsule Place 18 mcg into inhaler and inhale daily.     traZODone (DESYREL) 100 MG tablet Take 100 mg by mouth at bedtime.  No current facility-administered medications for this visit.     ROS:  See HPI  Physical Exam:  Incision: Left arm incision well-healed Extremities: Small area of swelling around the incision with surrounding hematoma.  This area is still somewhat soft.  2+ left radial pulse. 3/5 left hand grip strength     Studies: LUE Duplex (06/07/2023) Left: The area of concern in the distal upper arm is the basilic        vein, which is thrombosed at the area of the AVF anastomosis        that was ligated 05/02/23 to the mid upper arm. Below the [facility]        fossa, the basilic vein is patent and compressible.          The brachial artery, radial artery, ulnar artery, and palmar        arch are patent with multiphasic flow, without evidence of        pseudoaneurysm, or other obvious abnormality.   Assessment/Plan:  This is a 62 y.o. male who is s/p: basilic  fistula ligation on 1/61/0960  -Duplex of the left upper extremity demonstrates the swollen area of concern is the thrombosed basilic fistula.  Beneath the area of the anastomosis the basilic vein is patent and compressible with no clot.  His brachial, radial, ulnar, and palmar arteries are patent with multiphasic flow.  There is no evidence of ongoing bleeding in the left upper extremity. -The patient has had ongoing issues with left upper extremity swelling and a fluid collection at his incision site since ligation on 05/02/2023.  At this time it appears he may have developed a small hematoma, which is why his skin started turning purple over the weekend.  I have low concern for infection at this time given that the area does not look very irritated and is not tender or painful.  The patient also has not had any fevers. -I will send out a 1 week course of Keflex as prophylaxis.  He can follow-up with our office in 1 month with repeat wound check.  Hopefully at that time his incision and bruising has returned to normal.  In the interim he can still use an Ace wrap from the hand to the mid bicep to help with swelling.   Loel Dubonnet, PA-C [facility] [phone]   Clinic MD:  Randie Heinz

## 2023-06-08 DIAGNOSIS — D509 Iron deficiency anemia, unspecified: Secondary | ICD-10-CM | POA: Diagnosis not present

## 2023-06-08 DIAGNOSIS — Z23 Encounter for immunization: Secondary | ICD-10-CM | POA: Diagnosis not present

## 2023-06-08 DIAGNOSIS — Z992 Dependence on renal dialysis: Secondary | ICD-10-CM | POA: Diagnosis not present

## 2023-06-08 DIAGNOSIS — N186 End stage renal disease: Secondary | ICD-10-CM | POA: Diagnosis not present

## 2023-06-08 DIAGNOSIS — T8189XA Other complications of procedures, not elsewhere classified, initial encounter: Secondary | ICD-10-CM | POA: Diagnosis not present

## 2023-06-08 DIAGNOSIS — T82898A Other specified complication of vascular prosthetic devices, implants and grafts, initial encounter: Secondary | ICD-10-CM | POA: Diagnosis not present

## 2023-06-10 DIAGNOSIS — Z992 Dependence on renal dialysis: Secondary | ICD-10-CM | POA: Diagnosis not present

## 2023-06-10 DIAGNOSIS — T82898A Other specified complication of vascular prosthetic devices, implants and grafts, initial encounter: Secondary | ICD-10-CM | POA: Diagnosis not present

## 2023-06-10 DIAGNOSIS — Z23 Encounter for immunization: Secondary | ICD-10-CM | POA: Diagnosis not present

## 2023-06-10 DIAGNOSIS — N186 End stage renal disease: Secondary | ICD-10-CM | POA: Diagnosis not present

## 2023-06-10 DIAGNOSIS — T8189XA Other complications of procedures, not elsewhere classified, initial encounter: Secondary | ICD-10-CM | POA: Diagnosis not present

## 2023-06-10 DIAGNOSIS — D509 Iron deficiency anemia, unspecified: Secondary | ICD-10-CM | POA: Diagnosis not present

## 2023-06-12 ENCOUNTER — Other Ambulatory Visit (HOSPITAL_COMMUNITY): Payer: Medicare Other

## 2023-06-13 DIAGNOSIS — Z992 Dependence on renal dialysis: Secondary | ICD-10-CM | POA: Diagnosis not present

## 2023-06-13 DIAGNOSIS — Z23 Encounter for immunization: Secondary | ICD-10-CM | POA: Diagnosis not present

## 2023-06-13 DIAGNOSIS — D509 Iron deficiency anemia, unspecified: Secondary | ICD-10-CM | POA: Diagnosis not present

## 2023-06-13 DIAGNOSIS — N186 End stage renal disease: Secondary | ICD-10-CM | POA: Diagnosis not present

## 2023-06-13 DIAGNOSIS — N250 Renal osteodystrophy: Secondary | ICD-10-CM | POA: Diagnosis not present

## 2023-06-15 DIAGNOSIS — Z23 Encounter for immunization: Secondary | ICD-10-CM | POA: Diagnosis not present

## 2023-06-15 DIAGNOSIS — N250 Renal osteodystrophy: Secondary | ICD-10-CM | POA: Diagnosis not present

## 2023-06-15 DIAGNOSIS — N186 End stage renal disease: Secondary | ICD-10-CM | POA: Diagnosis not present

## 2023-06-15 DIAGNOSIS — Z992 Dependence on renal dialysis: Secondary | ICD-10-CM | POA: Diagnosis not present

## 2023-06-15 DIAGNOSIS — D509 Iron deficiency anemia, unspecified: Secondary | ICD-10-CM | POA: Diagnosis not present

## 2023-06-17 DIAGNOSIS — N250 Renal osteodystrophy: Secondary | ICD-10-CM | POA: Diagnosis not present

## 2023-06-17 DIAGNOSIS — Z992 Dependence on renal dialysis: Secondary | ICD-10-CM | POA: Diagnosis not present

## 2023-06-17 DIAGNOSIS — N186 End stage renal disease: Secondary | ICD-10-CM | POA: Diagnosis not present

## 2023-06-17 DIAGNOSIS — Z23 Encounter for immunization: Secondary | ICD-10-CM | POA: Diagnosis not present

## 2023-06-17 DIAGNOSIS — D509 Iron deficiency anemia, unspecified: Secondary | ICD-10-CM | POA: Diagnosis not present

## 2023-06-20 DIAGNOSIS — D509 Iron deficiency anemia, unspecified: Secondary | ICD-10-CM | POA: Diagnosis not present

## 2023-06-20 DIAGNOSIS — N250 Renal osteodystrophy: Secondary | ICD-10-CM | POA: Diagnosis not present

## 2023-06-20 DIAGNOSIS — N186 End stage renal disease: Secondary | ICD-10-CM | POA: Diagnosis not present

## 2023-06-20 DIAGNOSIS — Z992 Dependence on renal dialysis: Secondary | ICD-10-CM | POA: Diagnosis not present

## 2023-06-20 DIAGNOSIS — Z23 Encounter for immunization: Secondary | ICD-10-CM | POA: Diagnosis not present

## 2023-06-22 DIAGNOSIS — D509 Iron deficiency anemia, unspecified: Secondary | ICD-10-CM | POA: Diagnosis not present

## 2023-06-22 DIAGNOSIS — Z992 Dependence on renal dialysis: Secondary | ICD-10-CM | POA: Diagnosis not present

## 2023-06-22 DIAGNOSIS — N186 End stage renal disease: Secondary | ICD-10-CM | POA: Diagnosis not present

## 2023-06-22 DIAGNOSIS — Z23 Encounter for immunization: Secondary | ICD-10-CM | POA: Diagnosis not present

## 2023-06-22 DIAGNOSIS — N250 Renal osteodystrophy: Secondary | ICD-10-CM | POA: Diagnosis not present

## 2023-06-24 DIAGNOSIS — N186 End stage renal disease: Secondary | ICD-10-CM | POA: Diagnosis not present

## 2023-06-24 DIAGNOSIS — Z992 Dependence on renal dialysis: Secondary | ICD-10-CM | POA: Diagnosis not present

## 2023-06-24 DIAGNOSIS — D509 Iron deficiency anemia, unspecified: Secondary | ICD-10-CM | POA: Diagnosis not present

## 2023-06-24 DIAGNOSIS — N250 Renal osteodystrophy: Secondary | ICD-10-CM | POA: Diagnosis not present

## 2023-06-24 DIAGNOSIS — Z23 Encounter for immunization: Secondary | ICD-10-CM | POA: Diagnosis not present

## 2023-06-27 DIAGNOSIS — N250 Renal osteodystrophy: Secondary | ICD-10-CM | POA: Diagnosis not present

## 2023-06-27 DIAGNOSIS — D509 Iron deficiency anemia, unspecified: Secondary | ICD-10-CM | POA: Diagnosis not present

## 2023-06-27 DIAGNOSIS — Z23 Encounter for immunization: Secondary | ICD-10-CM | POA: Diagnosis not present

## 2023-06-27 DIAGNOSIS — N186 End stage renal disease: Secondary | ICD-10-CM | POA: Diagnosis not present

## 2023-06-27 DIAGNOSIS — Z992 Dependence on renal dialysis: Secondary | ICD-10-CM | POA: Diagnosis not present

## 2023-06-29 DIAGNOSIS — Z23 Encounter for immunization: Secondary | ICD-10-CM | POA: Diagnosis not present

## 2023-06-29 DIAGNOSIS — D509 Iron deficiency anemia, unspecified: Secondary | ICD-10-CM | POA: Diagnosis not present

## 2023-06-29 DIAGNOSIS — Z992 Dependence on renal dialysis: Secondary | ICD-10-CM | POA: Diagnosis not present

## 2023-06-29 DIAGNOSIS — N250 Renal osteodystrophy: Secondary | ICD-10-CM | POA: Diagnosis not present

## 2023-06-29 DIAGNOSIS — N186 End stage renal disease: Secondary | ICD-10-CM | POA: Diagnosis not present

## 2023-07-01 DIAGNOSIS — Z992 Dependence on renal dialysis: Secondary | ICD-10-CM | POA: Diagnosis not present

## 2023-07-01 DIAGNOSIS — N186 End stage renal disease: Secondary | ICD-10-CM | POA: Diagnosis not present

## 2023-07-01 DIAGNOSIS — D509 Iron deficiency anemia, unspecified: Secondary | ICD-10-CM | POA: Diagnosis not present

## 2023-07-01 DIAGNOSIS — N250 Renal osteodystrophy: Secondary | ICD-10-CM | POA: Diagnosis not present

## 2023-07-01 DIAGNOSIS — Z23 Encounter for immunization: Secondary | ICD-10-CM | POA: Diagnosis not present

## 2023-07-04 DIAGNOSIS — Z23 Encounter for immunization: Secondary | ICD-10-CM | POA: Diagnosis not present

## 2023-07-04 DIAGNOSIS — N186 End stage renal disease: Secondary | ICD-10-CM | POA: Diagnosis not present

## 2023-07-04 DIAGNOSIS — N250 Renal osteodystrophy: Secondary | ICD-10-CM | POA: Diagnosis not present

## 2023-07-04 DIAGNOSIS — Z992 Dependence on renal dialysis: Secondary | ICD-10-CM | POA: Diagnosis not present

## 2023-07-04 DIAGNOSIS — D509 Iron deficiency anemia, unspecified: Secondary | ICD-10-CM | POA: Diagnosis not present

## 2023-07-06 DIAGNOSIS — Z23 Encounter for immunization: Secondary | ICD-10-CM | POA: Diagnosis not present

## 2023-07-06 DIAGNOSIS — N250 Renal osteodystrophy: Secondary | ICD-10-CM | POA: Diagnosis not present

## 2023-07-06 DIAGNOSIS — Z992 Dependence on renal dialysis: Secondary | ICD-10-CM | POA: Diagnosis not present

## 2023-07-06 DIAGNOSIS — D509 Iron deficiency anemia, unspecified: Secondary | ICD-10-CM | POA: Diagnosis not present

## 2023-07-06 DIAGNOSIS — N186 End stage renal disease: Secondary | ICD-10-CM | POA: Diagnosis not present

## 2023-07-08 DIAGNOSIS — Z992 Dependence on renal dialysis: Secondary | ICD-10-CM | POA: Diagnosis not present

## 2023-07-08 DIAGNOSIS — Z23 Encounter for immunization: Secondary | ICD-10-CM | POA: Diagnosis not present

## 2023-07-08 DIAGNOSIS — N186 End stage renal disease: Secondary | ICD-10-CM | POA: Diagnosis not present

## 2023-07-08 DIAGNOSIS — D509 Iron deficiency anemia, unspecified: Secondary | ICD-10-CM | POA: Diagnosis not present

## 2023-07-08 DIAGNOSIS — N250 Renal osteodystrophy: Secondary | ICD-10-CM | POA: Diagnosis not present

## 2023-07-11 DIAGNOSIS — Z992 Dependence on renal dialysis: Secondary | ICD-10-CM | POA: Diagnosis not present

## 2023-07-11 DIAGNOSIS — Z23 Encounter for immunization: Secondary | ICD-10-CM | POA: Diagnosis not present

## 2023-07-11 DIAGNOSIS — N186 End stage renal disease: Secondary | ICD-10-CM | POA: Diagnosis not present

## 2023-07-11 DIAGNOSIS — D509 Iron deficiency anemia, unspecified: Secondary | ICD-10-CM | POA: Diagnosis not present

## 2023-07-11 DIAGNOSIS — N250 Renal osteodystrophy: Secondary | ICD-10-CM | POA: Diagnosis not present

## 2023-07-13 DIAGNOSIS — D509 Iron deficiency anemia, unspecified: Secondary | ICD-10-CM | POA: Diagnosis not present

## 2023-07-13 DIAGNOSIS — N186 End stage renal disease: Secondary | ICD-10-CM | POA: Diagnosis not present

## 2023-07-13 DIAGNOSIS — Z23 Encounter for immunization: Secondary | ICD-10-CM | POA: Diagnosis not present

## 2023-07-13 DIAGNOSIS — N250 Renal osteodystrophy: Secondary | ICD-10-CM | POA: Diagnosis not present

## 2023-07-13 DIAGNOSIS — Z992 Dependence on renal dialysis: Secondary | ICD-10-CM | POA: Diagnosis not present

## 2023-07-14 ENCOUNTER — Ambulatory Visit: Payer: Medicare Other

## 2023-07-14 DIAGNOSIS — Z992 Dependence on renal dialysis: Secondary | ICD-10-CM | POA: Diagnosis not present

## 2023-07-14 DIAGNOSIS — N186 End stage renal disease: Secondary | ICD-10-CM | POA: Diagnosis not present

## 2023-07-15 DIAGNOSIS — N186 End stage renal disease: Secondary | ICD-10-CM | POA: Diagnosis not present

## 2023-07-15 DIAGNOSIS — N250 Renal osteodystrophy: Secondary | ICD-10-CM | POA: Diagnosis not present

## 2023-07-15 DIAGNOSIS — Z992 Dependence on renal dialysis: Secondary | ICD-10-CM | POA: Diagnosis not present

## 2023-07-15 DIAGNOSIS — D509 Iron deficiency anemia, unspecified: Secondary | ICD-10-CM | POA: Diagnosis not present

## 2023-07-18 DIAGNOSIS — N186 End stage renal disease: Secondary | ICD-10-CM | POA: Diagnosis not present

## 2023-07-18 DIAGNOSIS — D509 Iron deficiency anemia, unspecified: Secondary | ICD-10-CM | POA: Diagnosis not present

## 2023-07-18 DIAGNOSIS — N250 Renal osteodystrophy: Secondary | ICD-10-CM | POA: Diagnosis not present

## 2023-07-18 DIAGNOSIS — Z992 Dependence on renal dialysis: Secondary | ICD-10-CM | POA: Diagnosis not present

## 2023-07-20 DIAGNOSIS — N250 Renal osteodystrophy: Secondary | ICD-10-CM | POA: Diagnosis not present

## 2023-07-20 DIAGNOSIS — D509 Iron deficiency anemia, unspecified: Secondary | ICD-10-CM | POA: Diagnosis not present

## 2023-07-20 DIAGNOSIS — Z992 Dependence on renal dialysis: Secondary | ICD-10-CM | POA: Diagnosis not present

## 2023-07-20 DIAGNOSIS — N186 End stage renal disease: Secondary | ICD-10-CM | POA: Diagnosis not present

## 2023-07-22 DIAGNOSIS — D509 Iron deficiency anemia, unspecified: Secondary | ICD-10-CM | POA: Diagnosis not present

## 2023-07-22 DIAGNOSIS — N186 End stage renal disease: Secondary | ICD-10-CM | POA: Diagnosis not present

## 2023-07-22 DIAGNOSIS — N250 Renal osteodystrophy: Secondary | ICD-10-CM | POA: Diagnosis not present

## 2023-07-22 DIAGNOSIS — Z992 Dependence on renal dialysis: Secondary | ICD-10-CM | POA: Diagnosis not present

## 2023-07-25 ENCOUNTER — Telehealth: Payer: Self-pay | Admitting: Neurology

## 2023-07-25 ENCOUNTER — Ambulatory Visit (INDEPENDENT_AMBULATORY_CARE_PROVIDER_SITE_OTHER): Payer: Medicare Other | Admitting: Neurology

## 2023-07-25 ENCOUNTER — Encounter: Payer: Self-pay | Admitting: Neurology

## 2023-07-25 VITALS — BP 145/57 | HR 76 | Ht 72.0 in | Wt 170.5 lb

## 2023-07-25 DIAGNOSIS — G629 Polyneuropathy, unspecified: Secondary | ICD-10-CM

## 2023-07-25 DIAGNOSIS — R269 Unspecified abnormalities of gait and mobility: Secondary | ICD-10-CM

## 2023-07-25 DIAGNOSIS — R42 Dizziness and giddiness: Secondary | ICD-10-CM

## 2023-07-25 DIAGNOSIS — G959 Disease of spinal cord, unspecified: Secondary | ICD-10-CM

## 2023-07-25 NOTE — Telephone Encounter (Signed)
Referral for physical therapy fax to [facility] in [place]. Phone: [phone], Fax:[phone]

## 2023-07-25 NOTE — Patient Instructions (Signed)
VISIT SUMMARY:  During your visit, we discussed the ongoing issues you have been experiencing following your neck and hand surgeries. We focused on your hand weakness and numbness, balance issues, and the importance of physical therapy for your recovery.  YOUR PLAN:  -HAND WEAKNESS AND NUMBNESS: You are experiencing partial numbness, weakness, and a cold sensation in your hand due to possibly inadequate blood flow or nerve damage from either neck compression or the surgery itself. We will order a nerve conduction study to assess the nerve function and refer you to physical therapy to help improve your hand strength and mobility.  -BALANCE ISSUES: You have been feeling unsteady and have had falls due to poor balance. We emphasized the importance of physical therapy for balance and gait training to help improve your stability. We will refer you to physical therapy for these issues.  -POST-SURGICAL NECK PAIN: You are no longer experiencing neck pain following your surgery by Dr. Franky Macho. However, you have not undergone post-surgical physical therapy due to limited mobility. Physical therapy will be important for your overall recovery.  INSTRUCTIONS:  We will schedule a nerve conduction study and follow up with physical therapy for your hand and balance issues. Please call our office if you experience any new symptoms or have any issues with your hand.

## 2023-07-25 NOTE — Progress Notes (Signed)
[facility]  PATIENT: Raymond [name] DOB: 12-13-1960  REQUESTING CLINICIAN: Nada Libman, MD HISTORY FROM: Patient and spouse  REASON FOR VISIT: Cervical stenosis     HISTORICAL  CHIEF COMPLAINT:  Chief Complaint  Patient presents with   Follow-up    Rm 12. Patient with wife Raymond [name], had a fistula reversal but still having numbness and pain in the left hand.     INTERVAL HISTORY 07/25/2023 Discussed the use of AI scribe software for clinical note transcription with the patient, who gave verbal consent to proceed.  The patient presents today for follow for his cervical myelopathy and now new left-hand numbness and weakness. He underwent neck surgery, performed by Dr. Franky [name], which successfully alleviated his neck pain. However, post-surgery, the patient reported difficulty with mobility and balance, and felt his walking had not improved. He did not participate in physical therapy post-surgery due to limited mobility.  Subsequently, the patient underwent hand surgery twice. The first surgery was to prepare a fistula for dialysis. However, post-surgery, the patient experienced limited mobility in his hand and coldness due to inadequate blood flow. Ninety percent of the blood flow was stopping at the fistula, with only ten percent reaching the hand per patient and spouse. A second surgery was performed to reverse the fistula, which restored warmth to the hand but did not improve mobility. The patient's hand remains partially numb and weak, and he struggles with tasks requiring hand strength, such as opening jars.  The patient also reported a fall in the kitchen due to balance issues. He has not been using any assistive devices for mobility but is considering physical therapy for balance improvement. The patient has not reported any pain in the hand, only numbness and weakness.       HISTORY OF PRESENT ILLNESS:  This is a 62 year old gentleman with multiple medical  conditions including hypertension, hyperlipidemia, diabetes type 2, CKD, CAD, history of cervical spondylosis with myelopathy status post fusion in 2000 who is presenting with ongoing dizziness.  Patient reported dizziness started on July and since then has been getting worse.  Dizziness described as swimmy headed.  He reported his the episodes happens when he stands up and start walking.  When sitting down or when lying down he is not experiencing any symptoms.  He has tried meclizine without any help.  He had 2 falls due to the dizziness.  He has a walker at home.  He said last year in December he had a right carotid stent placed.     OTHER MEDICAL CONDITIONS: Cervical spondylosis with myelopathy s/p fusion, Hypertension, Hyperlipidemia, DMII CAD, CKD, COPD,    REVIEW OF SYSTEMS: Full 14 system review of systems performed and negative with exception of: As noted in the HPI   ALLERGIES: No Known Allergies  HOME MEDICATIONS: Outpatient Medications Prior to Visit  Medication Sig Dispense Refill   acetaminophen (TYLENOL) 500 MG tablet Take 500 mg by mouth every 6 (six) hours as needed for moderate pain.     amLODipine (NORVASC) 10 MG tablet Take 10 mg by mouth daily.     aspirin 81 MG tablet Take 81 mg by mouth daily.     cilostazol (PLETAL) 100 MG tablet TAKE 1 TABLET 2 TIMES A DAY BEFORE A MEAL. 60 tablet 11   doxazosin (CARDURA) 4 MG tablet Take 1 tablet (4 mg total) by mouth daily. 30 tablet 3   famotidine (PEPCID) 20 MG tablet Take 20 mg by mouth daily as needed  for heartburn or indigestion.     ferrous sulfate 325 (65 FE) MG EC tablet Take 1 tablet (325 mg total) by mouth daily with breakfast. 30 tablet 0   fluticasone (FLONASE) 50 MCG/ACT nasal spray Place 1 spray into both nostrils daily.     furosemide (LASIX) 20 MG tablet Take 20 mg by mouth 2 (two) times daily.     metoprolol succinate (TOPROL-XL) 50 MG 24 hr tablet Take 50 mg by mouth daily.     minoxidil (LONITEN) 10 MG tablet  Take 10 mg by mouth 2 (two) times daily.     polyvinyl alcohol (LIQUIFILM TEARS) 1.4 % ophthalmic solution Place 1 drop into both eyes as needed for dry eyes.     Potassium 99 MG TABS Take 99 mg by mouth daily.     rosuvastatin (CRESTOR) 20 MG tablet Take 2 tablets (40 mg total) by mouth daily. (Patient taking differently: Take 20 mg by mouth daily.)     SPIRIVA HANDIHALER 18 MCG inhalation capsule Place 18 mcg into inhaler and inhale daily.     traZODone (DESYREL) 100 MG tablet Take 100 mg by mouth at bedtime.     cephALEXin (KEFLEX) 500 MG capsule Take 1 capsule (500 mg total) by mouth 3 (three) times daily. 21 capsule 0   gabapentin (NEURONTIN) 100 MG capsule Take 1 capsule (100 mg total) by mouth at bedtime. 30 capsule 0   linagliptin (TRADJENTA) 5 MG TABS tablet Take 5 mg by mouth daily.     oxyCODONE-acetaminophen (PERCOCET) 5-325 MG tablet Take 1 tablet by mouth every 4 (four) hours as needed for severe pain. 12 tablet 0   No facility-administered medications prior to visit.    PAST MEDICAL HISTORY: Past Medical History:  Diagnosis Date   Cancer (HCC)    melanoma   Carotid artery occlusion    Cervical spondylosis with myelopathy    Arthritis   CKD (chronic kidney disease)    stage III (07/2021)   COPD (chronic obstructive pulmonary disease) (HCC) 2022   Diabetes mellitus    Type II   GERD (gastroesophageal reflux disease)    History of blood transfusion 08/2021   2 units- had a hematoma after stent to carotid   Hyperlipidemia    Hypertension    Impotence of organic origin    Insomnia    Peripheral arterial disease (HCC)    Restless leg syndrome    Sleep apnea     PAST SURGICAL HISTORY: Past Surgical History:  Procedure Laterality Date   ANTERIOR CERVICAL DECOMP/DISCECTOMY FUSION N/A 07/13/2022   Procedure: Cervical Three-Four Anterior Cervical Discectomy Fusion with Plate removal;  Surgeon: Coletta Memos, MD;  Location: [facility] OR;  Service: Neurosurgery;  Laterality: N/A;   3C/RM 21 BEFORE DR CABBELLS OTHER CASE IN RM 21   APPENDECTOMY     years ago   AV FISTULA PLACEMENT Left 03/15/2023   Procedure: LEFT ARM FISTULA CREATION;  Surgeon: Raymond Libman, MD;  Location: [facility] OR;  Service: Vascular;  Laterality: Left;  with regional block   EYE SURGERY  2017   cataracts   LIGATION OF ARTERIOVENOUS  FISTULA Left 05/02/2023   Procedure: LEFT ARM FISTULA LIGATION;  Surgeon: Chuck Hint, MD;  Location: [facility] OR;  Service: Vascular;  Laterality: Left;   spinal fusion  09/12/1998   SPINE SURGERY  09/12/1998   Cervical Myelopathy- S/P fusion   TRANSCAROTID ARTERY REVASCULARIZATION  Right 08/13/2021   Procedure: RIGHT TRANSCAROTID ARTERY REVASCULARIZATION;  Surgeon: Maeola Harman,  MD;  Location: [facility] OR;  Service: Vascular;  Laterality: Right;   ULTRASOUND GUIDANCE FOR VASCULAR ACCESS Left 08/13/2021   Procedure: ULTRASOUND GUIDANCE FOR VASCULAR ACCESS;  Surgeon: Maeola Harman, MD;  Location: [facility] OR;  Service: Vascular;  Laterality: Left;    FAMILY HISTORY: Family History  Problem Relation Age of Onset   Cancer Mother    Hypertension Father    Hypertension Sister     SOCIAL HISTORY: Social History   Socioeconomic History   Marital status: Married    Spouse name: Not on file   Number of children: Not on file   Years of education: Not on file   Highest education level: Not on file  Occupational History   Not on file  Tobacco Use   Smoking status: Every Day    Current packs/day: 1.00    Average packs/day: 1 pack/day for 30.0 years (30.0 ttl pk-yrs)    Types: Cigarettes   Smokeless tobacco: Never  Vaping Use   Vaping status: Never Used  Substance and Sexual Activity   Alcohol use: Not Currently   Drug use: No   Sexual activity: Not on file  Other Topics Concern   Not on file  Social History Narrative   Not on file   Social Determinants of Health   Financial Resource Strain: Not on file  Food Insecurity: Not on file   Transportation Needs: Not on file  Physical Activity: Not on file  Stress: Not on file  Social Connections: Not on file  Intimate Partner Violence: Not on file    PHYSICAL EXAM  GENERAL EXAM/CONSTITUTIONAL: Vitals:  Vitals:   07/25/23 0759  BP: (!) 145/57  Pulse: 76  Weight: 170 lb 8 oz (77.3 kg)  Height: 6' (1.829 m)   Body mass index is 23.12 kg/m. Wt Readings from Last 3 Encounters:  07/25/23 170 lb 8 oz (77.3 kg)  06/07/23 169 lb (76.7 kg)  05/19/23 166 lb (75.3 kg)   Patient is in no distress; well developed, nourished and groomed; neck is supple  MUSCULOSKELETAL: Gait, strength, tone, movements noted in Neurologic exam below  NEUROLOGIC: MENTAL STATUS:      No data to display         awake, alert, oriented to person, place and time recent and remote memory intact normal attention and concentration language fluent, comprehension intact, naming intact fund of knowledge appropriate  CRANIAL NERVE:  2nd, 3rd, 4th, 6th - visual fields full to confrontation, extraocular muscles intact, no nystagmus 5th - facial sensation symmetric 7th - facial strength symmetric 8th - hearing intact 9th - palate elevates symmetrically, uvula midline 11th - shoulder shrug symmetric 12th - tongue protrusion midline  MOTOR:  normal bulk and tone, he has weakness in the bilateral hip flexors 4/5. There is also weakness in the left hand grip, normal flexion, extension of the wrist   SENSORY:  Decrease vibratory sense up to midshin  COORDINATION:  finger-nose-finger  REFLEXES:  deep tendon reflexes present and symmetric  GAIT/STATION:  Able to stand unassisted but wobbly, Unable to walk without assistance, has to hold on to spouse. He has a positive romberg.     DIAGNOSTIC DATA (LABS, IMAGING, TESTING) - I reviewed patient records, labs, notes, testing and imaging myself where available.  Lab Results  Component Value Date   WBC 6.6 03/23/2023   HGB 9.9 (L)  05/02/2023   HCT 29.0 (L) 05/02/2023   MCV 83.8 03/23/2023   PLT 165 03/23/2023  Component Value Date/Time   NA 130 (L) 05/02/2023 1112   K 3.7 05/02/2023 1112   CL 95 (L) 05/02/2023 1112   CO2 22 03/23/2023 1201   GLUCOSE 128 (H) 05/02/2023 1112   BUN 25 (H) 05/02/2023 1112   CREATININE 4.10 (H) 05/02/2023 1112   CALCIUM 8.5 (L) 03/23/2023 1201   PROT 5.9 (L) 09/09/2021 1121   ALBUMIN 3.5 03/23/2023 1201   AST 11 09/09/2021 1121   ALT 15 09/09/2021 1121   ALKPHOS 79 09/09/2021 1121   BILITOT 0.3 09/09/2021 1121   GFRNONAA 15 (L) 03/23/2023 1201   Lab Results  Component Value Date   CHOL 92 08/14/2021   HDL 33 (L) 08/14/2021   LDLCALC 41 08/14/2021   TRIG 88 08/14/2021   CHOLHDL 2.8 08/14/2021   Lab Results  Component Value Date   HGBA1C 7.0 (H) 07/07/2021   No results found for: "VITAMINB12" No results found for: "TSH"  MRI Brain without contrast 04/05/22 No acute intracranial process. Redemonstrated lacunar infarcts in the bilateral basal ganglia and left thalamus.  MRI Cervical spine 06/26/2022 1. Severe spinal canal stenosis and bilateral neural foraminal stenosis at C3-4 secondary to combination of disc bulge and severe facet arthrosis. Possible mild myelomalacia signal change 2. Severe right C2-3 neural foraminal stenosis. 3. Mild spinal canal stenosis and bilateral neural foraminal stenosis at C6-7. 4. C4-6 ACDF with solid fusion.   ASSESSMENT AND PLAN    Hand Weakness and Numbness   Following post-fistula surgery, he is experiencing partial numbness, weakness, and a cold sensation in the hand due to inadequate blood flow, with suspected nerve damage from either neck compression or the surgery itself. We will order a nerve conduction study and refer him to physical therapy for hand and balance issues, coordinating with [facility] outpatient rehab in [place].  Balance Issues   He reports unsteadiness and poor balance, including a history of falls, without  using any assistive devices. We emphasized the importance of physical therapy for balance and gait training and will refer him for these issues.   Follow-up   We will schedule a nerve conduction study and follow up with physical therapy for hand and balance issues. He is advised to call the office if new symptoms arise or if there are issues with the hand.        1. Abnormal gait   2. Dizziness   3. Peripheral polyneuropathy   4. Cervical myelopathy [facility])      Patient Instructions  VISIT SUMMARY:  During your visit, we discussed the ongoing issues you have been experiencing following your neck and hand surgeries. We focused on your hand weakness and numbness, balance issues, and the importance of physical therapy for your recovery.  YOUR PLAN:  -HAND WEAKNESS AND NUMBNESS: You are experiencing partial numbness, weakness, and a cold sensation in your hand due to possibly inadequate blood flow or nerve damage from either neck compression or the surgery itself. We will order a nerve conduction study to assess the nerve function and refer you to physical therapy to help improve your hand strength and mobility.  -BALANCE ISSUES: You have been feeling unsteady and have had falls due to poor balance. We emphasized the importance of physical therapy for balance and gait training to help improve your stability. We will refer you to physical therapy for these issues.  -POST-SURGICAL NECK PAIN: You are no longer experiencing neck pain following your surgery by Dr. Franky [name]. However, you have not undergone post-surgical physical therapy due  to limited mobility. Physical therapy will be important for your overall recovery.  INSTRUCTIONS:  We will schedule a nerve conduction study and follow up with physical therapy for your hand and balance issues. Please call our office if you experience any new symptoms or have any issues with your hand.  Orders Placed This Encounter  Procedures   Ambulatory  referral to Physical Therapy   NCV with EMG(electromyography)    No orders of the defined types were placed in this encounter.   Return if symptoms worsen or fail to improve.    Windell Norfolk, MD 07/25/2023, 9:16 AM  [facility] [address], [address] [place], [place] [address] [phone]

## 2023-07-27 DIAGNOSIS — N250 Renal osteodystrophy: Secondary | ICD-10-CM | POA: Diagnosis not present

## 2023-07-27 DIAGNOSIS — D509 Iron deficiency anemia, unspecified: Secondary | ICD-10-CM | POA: Diagnosis not present

## 2023-07-27 DIAGNOSIS — N186 End stage renal disease: Secondary | ICD-10-CM | POA: Diagnosis not present

## 2023-07-27 DIAGNOSIS — Z992 Dependence on renal dialysis: Secondary | ICD-10-CM | POA: Diagnosis not present

## 2023-07-29 DIAGNOSIS — Z992 Dependence on renal dialysis: Secondary | ICD-10-CM | POA: Diagnosis not present

## 2023-07-29 DIAGNOSIS — N186 End stage renal disease: Secondary | ICD-10-CM | POA: Diagnosis not present

## 2023-07-29 DIAGNOSIS — D509 Iron deficiency anemia, unspecified: Secondary | ICD-10-CM | POA: Diagnosis not present

## 2023-07-29 DIAGNOSIS — N250 Renal osteodystrophy: Secondary | ICD-10-CM | POA: Diagnosis not present

## 2023-07-31 NOTE — Telephone Encounter (Signed)
Pt's wife called and LVM wanting to speak to someone regarding this referral. She would like for it to be in [place] which is where they live. Wife Dois Davenport would like to be called back at [phone] due to the fact that the husband does not pick up the other number.

## 2023-08-01 DIAGNOSIS — Z992 Dependence on renal dialysis: Secondary | ICD-10-CM | POA: Diagnosis not present

## 2023-08-01 DIAGNOSIS — N186 End stage renal disease: Secondary | ICD-10-CM | POA: Diagnosis not present

## 2023-08-01 DIAGNOSIS — D509 Iron deficiency anemia, unspecified: Secondary | ICD-10-CM | POA: Diagnosis not present

## 2023-08-01 DIAGNOSIS — N250 Renal osteodystrophy: Secondary | ICD-10-CM | POA: Diagnosis not present

## 2023-08-02 NOTE — Telephone Encounter (Signed)
Pt's wife called again wanting to know when this will be switched from [place] to [place]. Pt requested a call back.

## 2023-08-03 DIAGNOSIS — D509 Iron deficiency anemia, unspecified: Secondary | ICD-10-CM | POA: Diagnosis not present

## 2023-08-03 DIAGNOSIS — N186 End stage renal disease: Secondary | ICD-10-CM | POA: Diagnosis not present

## 2023-08-03 DIAGNOSIS — Z992 Dependence on renal dialysis: Secondary | ICD-10-CM | POA: Diagnosis not present

## 2023-08-03 DIAGNOSIS — N250 Renal osteodystrophy: Secondary | ICD-10-CM | POA: Diagnosis not present

## 2023-08-05 DIAGNOSIS — D509 Iron deficiency anemia, unspecified: Secondary | ICD-10-CM | POA: Diagnosis not present

## 2023-08-05 DIAGNOSIS — N186 End stage renal disease: Secondary | ICD-10-CM | POA: Diagnosis not present

## 2023-08-05 DIAGNOSIS — Z992 Dependence on renal dialysis: Secondary | ICD-10-CM | POA: Diagnosis not present

## 2023-08-05 DIAGNOSIS — N250 Renal osteodystrophy: Secondary | ICD-10-CM | POA: Diagnosis not present

## 2023-08-08 DIAGNOSIS — N186 End stage renal disease: Secondary | ICD-10-CM | POA: Diagnosis not present

## 2023-08-08 DIAGNOSIS — D509 Iron deficiency anemia, unspecified: Secondary | ICD-10-CM | POA: Diagnosis not present

## 2023-08-08 DIAGNOSIS — N250 Renal osteodystrophy: Secondary | ICD-10-CM | POA: Diagnosis not present

## 2023-08-08 DIAGNOSIS — Z992 Dependence on renal dialysis: Secondary | ICD-10-CM | POA: Diagnosis not present

## 2023-08-09 ENCOUNTER — Ambulatory Visit: Payer: Medicare Other

## 2023-08-10 DIAGNOSIS — Z992 Dependence on renal dialysis: Secondary | ICD-10-CM | POA: Diagnosis not present

## 2023-08-10 DIAGNOSIS — N186 End stage renal disease: Secondary | ICD-10-CM | POA: Diagnosis not present

## 2023-08-10 DIAGNOSIS — N250 Renal osteodystrophy: Secondary | ICD-10-CM | POA: Diagnosis not present

## 2023-08-10 DIAGNOSIS — D509 Iron deficiency anemia, unspecified: Secondary | ICD-10-CM | POA: Diagnosis not present

## 2023-08-12 DIAGNOSIS — D509 Iron deficiency anemia, unspecified: Secondary | ICD-10-CM | POA: Diagnosis not present

## 2023-08-12 DIAGNOSIS — N250 Renal osteodystrophy: Secondary | ICD-10-CM | POA: Diagnosis not present

## 2023-08-12 DIAGNOSIS — Z992 Dependence on renal dialysis: Secondary | ICD-10-CM | POA: Diagnosis not present

## 2023-08-12 DIAGNOSIS — N186 End stage renal disease: Secondary | ICD-10-CM | POA: Diagnosis not present

## 2023-08-13 DIAGNOSIS — N186 End stage renal disease: Secondary | ICD-10-CM | POA: Diagnosis not present

## 2023-08-13 DIAGNOSIS — Z992 Dependence on renal dialysis: Secondary | ICD-10-CM | POA: Diagnosis not present

## 2023-08-15 DIAGNOSIS — N186 End stage renal disease: Secondary | ICD-10-CM | POA: Diagnosis not present

## 2023-08-15 DIAGNOSIS — D509 Iron deficiency anemia, unspecified: Secondary | ICD-10-CM | POA: Diagnosis not present

## 2023-08-15 DIAGNOSIS — Z23 Encounter for immunization: Secondary | ICD-10-CM | POA: Diagnosis not present

## 2023-08-15 DIAGNOSIS — Z992 Dependence on renal dialysis: Secondary | ICD-10-CM | POA: Diagnosis not present

## 2023-08-15 DIAGNOSIS — N250 Renal osteodystrophy: Secondary | ICD-10-CM | POA: Diagnosis not present

## 2023-08-17 DIAGNOSIS — Z23 Encounter for immunization: Secondary | ICD-10-CM | POA: Diagnosis not present

## 2023-08-17 DIAGNOSIS — N186 End stage renal disease: Secondary | ICD-10-CM | POA: Diagnosis not present

## 2023-08-17 DIAGNOSIS — N250 Renal osteodystrophy: Secondary | ICD-10-CM | POA: Diagnosis not present

## 2023-08-17 DIAGNOSIS — D509 Iron deficiency anemia, unspecified: Secondary | ICD-10-CM | POA: Diagnosis not present

## 2023-08-17 DIAGNOSIS — Z992 Dependence on renal dialysis: Secondary | ICD-10-CM | POA: Diagnosis not present

## 2023-08-18 ENCOUNTER — Other Ambulatory Visit: Payer: Self-pay | Admitting: Neurology

## 2023-08-18 DIAGNOSIS — R269 Unspecified abnormalities of gait and mobility: Secondary | ICD-10-CM

## 2023-08-18 DIAGNOSIS — R42 Dizziness and giddiness: Secondary | ICD-10-CM

## 2023-08-18 NOTE — Telephone Encounter (Signed)
Done. Thanks.

## 2023-08-19 DIAGNOSIS — N186 End stage renal disease: Secondary | ICD-10-CM | POA: Diagnosis not present

## 2023-08-19 DIAGNOSIS — Z992 Dependence on renal dialysis: Secondary | ICD-10-CM | POA: Diagnosis not present

## 2023-08-19 DIAGNOSIS — D509 Iron deficiency anemia, unspecified: Secondary | ICD-10-CM | POA: Diagnosis not present

## 2023-08-19 DIAGNOSIS — N250 Renal osteodystrophy: Secondary | ICD-10-CM | POA: Diagnosis not present

## 2023-08-19 DIAGNOSIS — Z23 Encounter for immunization: Secondary | ICD-10-CM | POA: Diagnosis not present

## 2023-08-22 DIAGNOSIS — N186 End stage renal disease: Secondary | ICD-10-CM | POA: Diagnosis not present

## 2023-08-22 DIAGNOSIS — N250 Renal osteodystrophy: Secondary | ICD-10-CM | POA: Diagnosis not present

## 2023-08-22 DIAGNOSIS — Z23 Encounter for immunization: Secondary | ICD-10-CM | POA: Diagnosis not present

## 2023-08-22 DIAGNOSIS — Z992 Dependence on renal dialysis: Secondary | ICD-10-CM | POA: Diagnosis not present

## 2023-08-22 DIAGNOSIS — D509 Iron deficiency anemia, unspecified: Secondary | ICD-10-CM | POA: Diagnosis not present

## 2023-08-24 ENCOUNTER — Telehealth: Payer: Self-pay | Admitting: Neurology

## 2023-08-24 DIAGNOSIS — N250 Renal osteodystrophy: Secondary | ICD-10-CM | POA: Diagnosis not present

## 2023-08-24 DIAGNOSIS — Z23 Encounter for immunization: Secondary | ICD-10-CM | POA: Diagnosis not present

## 2023-08-24 DIAGNOSIS — N186 End stage renal disease: Secondary | ICD-10-CM | POA: Diagnosis not present

## 2023-08-24 DIAGNOSIS — Z992 Dependence on renal dialysis: Secondary | ICD-10-CM | POA: Diagnosis not present

## 2023-08-24 DIAGNOSIS — D509 Iron deficiency anemia, unspecified: Secondary | ICD-10-CM | POA: Diagnosis not present

## 2023-08-24 NOTE — Telephone Encounter (Signed)
Referral for physical therapy fax to [facility]. Phone:  [phone], Fax: [phone]

## 2023-08-26 DIAGNOSIS — D509 Iron deficiency anemia, unspecified: Secondary | ICD-10-CM | POA: Diagnosis not present

## 2023-08-26 DIAGNOSIS — Z23 Encounter for immunization: Secondary | ICD-10-CM | POA: Diagnosis not present

## 2023-08-26 DIAGNOSIS — N250 Renal osteodystrophy: Secondary | ICD-10-CM | POA: Diagnosis not present

## 2023-08-26 DIAGNOSIS — Z992 Dependence on renal dialysis: Secondary | ICD-10-CM | POA: Diagnosis not present

## 2023-08-26 DIAGNOSIS — N186 End stage renal disease: Secondary | ICD-10-CM | POA: Diagnosis not present

## 2023-08-29 DIAGNOSIS — Z23 Encounter for immunization: Secondary | ICD-10-CM | POA: Diagnosis not present

## 2023-08-29 DIAGNOSIS — N186 End stage renal disease: Secondary | ICD-10-CM | POA: Diagnosis not present

## 2023-08-29 DIAGNOSIS — Z992 Dependence on renal dialysis: Secondary | ICD-10-CM | POA: Diagnosis not present

## 2023-08-29 DIAGNOSIS — N250 Renal osteodystrophy: Secondary | ICD-10-CM | POA: Diagnosis not present

## 2023-08-29 DIAGNOSIS — D509 Iron deficiency anemia, unspecified: Secondary | ICD-10-CM | POA: Diagnosis not present

## 2023-08-31 DIAGNOSIS — Z23 Encounter for immunization: Secondary | ICD-10-CM | POA: Diagnosis not present

## 2023-08-31 DIAGNOSIS — N250 Renal osteodystrophy: Secondary | ICD-10-CM | POA: Diagnosis not present

## 2023-08-31 DIAGNOSIS — N186 End stage renal disease: Secondary | ICD-10-CM | POA: Diagnosis not present

## 2023-08-31 DIAGNOSIS — D509 Iron deficiency anemia, unspecified: Secondary | ICD-10-CM | POA: Diagnosis not present

## 2023-08-31 DIAGNOSIS — Z992 Dependence on renal dialysis: Secondary | ICD-10-CM | POA: Diagnosis not present

## 2023-09-02 DIAGNOSIS — N250 Renal osteodystrophy: Secondary | ICD-10-CM | POA: Diagnosis not present

## 2023-09-02 DIAGNOSIS — Z992 Dependence on renal dialysis: Secondary | ICD-10-CM | POA: Diagnosis not present

## 2023-09-02 DIAGNOSIS — Z23 Encounter for immunization: Secondary | ICD-10-CM | POA: Diagnosis not present

## 2023-09-02 DIAGNOSIS — D509 Iron deficiency anemia, unspecified: Secondary | ICD-10-CM | POA: Diagnosis not present

## 2023-09-02 DIAGNOSIS — N186 End stage renal disease: Secondary | ICD-10-CM | POA: Diagnosis not present

## 2023-09-04 ENCOUNTER — Ambulatory Visit: Payer: Medicare Other | Admitting: Surgery

## 2023-09-04 DIAGNOSIS — Z992 Dependence on renal dialysis: Secondary | ICD-10-CM | POA: Diagnosis not present

## 2023-09-04 DIAGNOSIS — Z23 Encounter for immunization: Secondary | ICD-10-CM | POA: Diagnosis not present

## 2023-09-04 DIAGNOSIS — D509 Iron deficiency anemia, unspecified: Secondary | ICD-10-CM | POA: Diagnosis not present

## 2023-09-04 DIAGNOSIS — N250 Renal osteodystrophy: Secondary | ICD-10-CM | POA: Diagnosis not present

## 2023-09-04 DIAGNOSIS — N186 End stage renal disease: Secondary | ICD-10-CM | POA: Diagnosis not present

## 2023-09-07 DIAGNOSIS — Z992 Dependence on renal dialysis: Secondary | ICD-10-CM | POA: Diagnosis not present

## 2023-09-07 DIAGNOSIS — N186 End stage renal disease: Secondary | ICD-10-CM | POA: Diagnosis not present

## 2023-09-07 DIAGNOSIS — Z23 Encounter for immunization: Secondary | ICD-10-CM | POA: Diagnosis not present

## 2023-09-07 DIAGNOSIS — D509 Iron deficiency anemia, unspecified: Secondary | ICD-10-CM | POA: Diagnosis not present

## 2023-09-07 DIAGNOSIS — N250 Renal osteodystrophy: Secondary | ICD-10-CM | POA: Diagnosis not present

## 2023-09-09 DIAGNOSIS — D509 Iron deficiency anemia, unspecified: Secondary | ICD-10-CM | POA: Diagnosis not present

## 2023-09-09 DIAGNOSIS — N186 End stage renal disease: Secondary | ICD-10-CM | POA: Diagnosis not present

## 2023-09-09 DIAGNOSIS — Z23 Encounter for immunization: Secondary | ICD-10-CM | POA: Diagnosis not present

## 2023-09-09 DIAGNOSIS — Z992 Dependence on renal dialysis: Secondary | ICD-10-CM | POA: Diagnosis not present

## 2023-09-09 DIAGNOSIS — N250 Renal osteodystrophy: Secondary | ICD-10-CM | POA: Diagnosis not present

## 2023-09-09 DIAGNOSIS — E119 Type 2 diabetes mellitus without complications: Secondary | ICD-10-CM | POA: Diagnosis not present

## 2023-09-12 DIAGNOSIS — Z992 Dependence on renal dialysis: Secondary | ICD-10-CM | POA: Diagnosis not present

## 2023-09-12 DIAGNOSIS — N186 End stage renal disease: Secondary | ICD-10-CM | POA: Diagnosis not present

## 2023-09-12 DIAGNOSIS — D509 Iron deficiency anemia, unspecified: Secondary | ICD-10-CM | POA: Diagnosis not present

## 2023-09-12 DIAGNOSIS — Z23 Encounter for immunization: Secondary | ICD-10-CM | POA: Diagnosis not present

## 2023-09-12 DIAGNOSIS — N250 Renal osteodystrophy: Secondary | ICD-10-CM | POA: Diagnosis not present

## 2023-09-13 DIAGNOSIS — Z992 Dependence on renal dialysis: Secondary | ICD-10-CM | POA: Diagnosis not present

## 2023-09-13 DIAGNOSIS — N186 End stage renal disease: Secondary | ICD-10-CM | POA: Diagnosis not present

## 2023-09-14 DIAGNOSIS — Z992 Dependence on renal dialysis: Secondary | ICD-10-CM | POA: Diagnosis not present

## 2023-09-14 DIAGNOSIS — D509 Iron deficiency anemia, unspecified: Secondary | ICD-10-CM | POA: Diagnosis not present

## 2023-09-14 DIAGNOSIS — N250 Renal osteodystrophy: Secondary | ICD-10-CM | POA: Diagnosis not present

## 2023-09-14 DIAGNOSIS — N186 End stage renal disease: Secondary | ICD-10-CM | POA: Diagnosis not present

## 2023-09-16 DIAGNOSIS — Z992 Dependence on renal dialysis: Secondary | ICD-10-CM | POA: Diagnosis not present

## 2023-09-16 DIAGNOSIS — N250 Renal osteodystrophy: Secondary | ICD-10-CM | POA: Diagnosis not present

## 2023-09-16 DIAGNOSIS — N186 End stage renal disease: Secondary | ICD-10-CM | POA: Diagnosis not present

## 2023-09-16 DIAGNOSIS — D509 Iron deficiency anemia, unspecified: Secondary | ICD-10-CM | POA: Diagnosis not present

## 2023-09-19 DIAGNOSIS — Z992 Dependence on renal dialysis: Secondary | ICD-10-CM | POA: Diagnosis not present

## 2023-09-19 DIAGNOSIS — N250 Renal osteodystrophy: Secondary | ICD-10-CM | POA: Diagnosis not present

## 2023-09-19 DIAGNOSIS — D509 Iron deficiency anemia, unspecified: Secondary | ICD-10-CM | POA: Diagnosis not present

## 2023-09-19 DIAGNOSIS — N186 End stage renal disease: Secondary | ICD-10-CM | POA: Diagnosis not present

## 2023-09-21 DIAGNOSIS — N186 End stage renal disease: Secondary | ICD-10-CM | POA: Diagnosis not present

## 2023-09-21 DIAGNOSIS — N250 Renal osteodystrophy: Secondary | ICD-10-CM | POA: Diagnosis not present

## 2023-09-21 DIAGNOSIS — D509 Iron deficiency anemia, unspecified: Secondary | ICD-10-CM | POA: Diagnosis not present

## 2023-09-21 DIAGNOSIS — Z992 Dependence on renal dialysis: Secondary | ICD-10-CM | POA: Diagnosis not present

## 2023-09-25 ENCOUNTER — Ambulatory Visit (INDEPENDENT_AMBULATORY_CARE_PROVIDER_SITE_OTHER): Payer: Medicare Other | Admitting: Neurology

## 2023-09-25 ENCOUNTER — Ambulatory Visit (INDEPENDENT_AMBULATORY_CARE_PROVIDER_SITE_OTHER): Payer: Self-pay | Admitting: Neurology

## 2023-09-25 DIAGNOSIS — R269 Unspecified abnormalities of gait and mobility: Secondary | ICD-10-CM

## 2023-09-25 DIAGNOSIS — G629 Polyneuropathy, unspecified: Secondary | ICD-10-CM

## 2023-09-25 DIAGNOSIS — Z0289 Encounter for other administrative examinations: Secondary | ICD-10-CM

## 2023-09-25 DIAGNOSIS — G959 Disease of spinal cord, unspecified: Secondary | ICD-10-CM

## 2023-09-25 NOTE — Progress Notes (Signed)
 Full Name: Raymond [name] Gender: Male MRN #: [id number] Date of Birth: 1960/12/31    Visit Date: 09/25/2023 14:39 Age: 63 Years Examining Physician: Dr. Onetha Epp Referring Physician: Dr. Pastor Falling Height: 6 feet 0 inch  History: 63 y.o. male who is s/p left arm basilic vein fistula creation  on 03/15/2023.  He developed steal symptoms in his left hand and underwent fistula ligation on 05/02/2023.   Patient states today that the  entire left hand from the wrist down is numb and tingly. The right hand is unaffected. Patient and wife also report:that left hand symptoms started with fistula placement after which he lost all feeling in his hand, it was cold due to not getting blood flow to the hand; symptoms improved after the fistula ligation but he reports he still has residual left-hand symptoms.  Summary: EMG/NCS was performed on the bilateral upper extremities: The left median, right median, left ulnar (ADM) motor conductions showed no response.  The left ulnar (FDI) conduction showed delayed distal onset latency (4.8 ms, normal less than 4.5) and decreased amplitude (1.2 mV, normal greater than 7) with a 12 m/s drop across the elbow.   The right ulnar (FDI) conduction showed delayed distal onset latency (5.1 ms, normal less than 4.5) with a 12 m/s drop across the elbow. The left radial sensory conduction showed reduced amplitude (3 V, normal greater than 15).  The right radial sensory conduction showed delayed distal peak latency (3 ms, normal less than 2.9) and decreased amplitude (14 V, normal greater than 15).  The left median orthodromic second digit sensory conduction showed no response.  The right median orthodromic second digit sensory conduction showed delayed distal peak latency (4.7 ms, normal less than 3.4) and reduced amplitude (4 mV, normal greater than 10).  The left ulnar orthodromic fifth digit sensory conduction showed no response.  The right ulnar orthodromic fifth  digit sensory conduction showed delayed distal peak latency (3.8 ms, normal less than 3.1) and reduced amplitude (4 V, normal greater than 5).  The right ulnar F wave showed delayed latency (34.8 ms, normal less than 32).  All remaining nerves (as indicated in the following tables) were within normal limits. The left deltoid muscle showed diminished motor unit recruitment.  The left biceps muscle showed prolonged motor unit duration, polyphasic motor units and diminished motor unit recruitment.  The left triceps muscle showed polyphasic motor units and diminished motor unit recruitment.  The left first dorsal interosseous muscle showed spontaneous activity, prolonged motor unit duration, polyphasic motor units and diminished motor unit recruitment.  The left opponens pollicis showed polyphasic motor units and diminished motor unit recruitment.  The left flexor digitorum profundus showed polyphasic motor units and diminished motor unit recruitment.  The right deltoid showed increased motor unit amplitude, and diminished motor unit recruitment.  The right triceps, right biceps muscles showed prolonged motor unit duration, polyphasic motor units and diminished motor unit recruitment.   The right opponens pollicis showed polyphasic motor units and diminished motor unit recruitment.  The right first dorsal interosseous showed polyphasic motor units, prolonged motor unit duration and diminished motor unit recruitment. All remaining muscles (as indicated in the following tables) were within normal limits.       Conclusion:  Nerve conduction studies(NCS) of the left upper extremity showed absent median and ulnar(ADM) motor conductions and reduced motor amplitude in the Ulnar(FDI) nerve.  NCS also showed absent left sensory amplitudes in the median, and ulnar  nerves and reduced sensory amplitude in the radial nerve. EMG Needle study of the left arm showed denervation of the distal ulnar First [place] Interosseous muscle  without denervation of more proximal muscles. Given the time course of symptoms and the proximity to the AV fistula, could be secondary to Vascular Steal Syndrome or direct compression by the fistula. There is a drop of 74m/s across the left elbow when recording the Ulnar motor nerve from the First [place] Interosseous muscle. Further investigation of the ulnar nerve at the elbow recommended to ensure no ulnar nerve entrapment at that location.  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 Chronic neurogenic changes in proximal muscles of both arms likely due to remote cervical radiculopathy.   ------------------------------- Onetha Epp, M.D.  [facility] [address], [address] [place], [place] [address] Tel: [phone] Fax: [phone]  Verbal informed consent was obtained from the patient, patient was informed of potential risk of procedure, including bruising, bleeding, hematoma formation, infection, muscle weakness, muscle pain, numbness, among others.        MNC    Nerve / Sites Muscle Latency Ref. Amplitude Ref. Rel Amp Segments Distance Velocity Ref. Area    ms ms mV mV %  cm m/s m/s mVms  L Median - APB     Wrist APB NR <=4.4 NR >=4.0 NR Wrist - APB 7   NR  R Median - APB     Wrist APB NR <=4.4 NR >=4.0 NR Wrist - APB 7   NR  L Ulnar - ADM     Wrist ADM NR <=3.3 NR >=6.0 NR Wrist - ADM 7   NR  R Ulnar - ADM     Wrist ADM 3.3 <=3.3 6.8 >=6.0 100 Wrist - ADM 7   17.4     B.Elbow ADM 5.1  6.5  95.2 B.Elbow - Wrist 9.6 52 >=49 17.4     A.Elbow ADM 9.1  4.7  73.2 A.Elbow - B.Elbow 20.6 52 >=49 13.2  L Ulnar - FDI     Wrist FDI 4.8 <=4.5 1.2 >=7.0 100 Wrist - FDI 8   1.7     B.Elbow FDI 7.0  1.2  98.7 B.Elbow - Wrist 9 42 >=49 1.8     A.Elbow FDI 13.7  0.3  24.8 A.Elbow - B.Elbow 20 30 >=49 0.6         A.Elbow - Wrist      R Ulnar - FDI     Wrist FDI 5.1 <=4.5 8.6 >=7.0 100 Wrist - FDI 8   19.7     B.Elbow FDI 7.0  9.0  104 B.Elbow - Wrist 10 53 >=49 21.0      A.Elbow FDI 11.9  2.7  30.1 A.Elbow - B.Elbow 20 41 >=49 6.1         A.Elbow - Wrist                     SNC    Nerve / Sites Rec. Site Peak Lat Ref.  Amp Ref. Segments Distance Ref.    ms ms V V  cm ms  L Radial - Anatomical snuff box (Forearm)     Forearm Wrist 2.9 <=2.9 3 >=15 Forearm - Wrist 10   R Radial - Anatomical snuff box (Forearm)     Forearm Wrist 3.0 <=2.9 14 >=15 Forearm - Wrist 10   L Median - Orthodromic (Dig II, Mid palm)     Dig II Wrist NR <=3.4 NR >=10 Dig  II - Wrist 13   R Median - Orthodromic (Dig II, Mid palm)     Dig II Wrist 4.7 <=3.4 4 >=10 Dig II - Wrist 13   L Ulnar - Orthodromic, (Dig V, Mid palm)     Dig V Wrist NR <=3.1 NR >=5 Dig V - Wrist 11   R Ulnar - Orthodromic, (Dig V, Mid palm)     Dig V Wrist 3.8 <=3.1 4 >=5 Dig V - Wrist 16                      F  Wave    Nerve F Lat Ref.   ms ms  R Ulnar - ADM 34.8 <=32.0       EMG Summary Table    Spontaneous MUAP Recruitment  Muscle IA Fib PSW Fasc Other Amp Dur. Poly Pattern  L. Deltoid Normal None None None _______ Normal Normal Normal Reduced  L. Biceps brachii Normal None None None _______ Normal Increased 3+ Reduced  L. Cervical paraspinals (low) Normal None None None _______ Normal Normal Normal Normal  L. Triceps brachii Normal None None None _______ Normal Normal 2+ Reduced  L. First dorsal interosseous Normal None 3+ None _______ Normal Increased 4+ Reduced  L. Pronator teres Normal None None None _______ Normal Normal Normal Normal  L. Extensor digitorum communis Normal None None None _______ Normal Normal Normal Normal  L. Extensor indicis proprius Normal None None None _______ Normal Normal Normal Normal  L. Opponens pollicis Normal None None None _______ Normal Normal 2+ Reduced  L. Flexor digitorum profundus (Ulnar) Normal None None None _______ Normal Normal 2+ Reduced  R. Deltoid Normal None None None _______ Increased Normal Normal Reduced  R. Cervical paraspinals (low) Normal  None None None _______ Normal Normal Normal Normal  R. Triceps brachii Normal None None None _______ Normal Increased 1+ Reduced  R. Biceps brachii Normal None None None _______ Normal Increased 2+ Reduced  R. Extensor digitorum communis Normal None None None _______ Normal Normal Normal Normal  R. Extensor indicis proprius Normal None None None _______ Normal Normal Normal Normal  R. Pronator teres Normal None None None _______ Normal Normal Normal Normal  R. Opponens pollicis Normal None None None _______ Normal Normal Normal Normal  R. Flexor digitorum profundus (Ulnar) Normal None None None _______ Normal Normal Normal Normal  R. First dorsal interosseous Normal None None None _______ Normal Normal Normal Normal

## 2023-09-26 DIAGNOSIS — D509 Iron deficiency anemia, unspecified: Secondary | ICD-10-CM | POA: Diagnosis not present

## 2023-09-26 DIAGNOSIS — N186 End stage renal disease: Secondary | ICD-10-CM | POA: Diagnosis not present

## 2023-09-26 DIAGNOSIS — Z992 Dependence on renal dialysis: Secondary | ICD-10-CM | POA: Diagnosis not present

## 2023-09-26 DIAGNOSIS — N250 Renal osteodystrophy: Secondary | ICD-10-CM | POA: Diagnosis not present

## 2023-09-27 NOTE — Progress Notes (Signed)
 See procedure note.

## 2023-09-28 DIAGNOSIS — N186 End stage renal disease: Secondary | ICD-10-CM | POA: Diagnosis not present

## 2023-09-28 DIAGNOSIS — D509 Iron deficiency anemia, unspecified: Secondary | ICD-10-CM | POA: Diagnosis not present

## 2023-09-28 DIAGNOSIS — N250 Renal osteodystrophy: Secondary | ICD-10-CM | POA: Diagnosis not present

## 2023-09-28 DIAGNOSIS — Z992 Dependence on renal dialysis: Secondary | ICD-10-CM | POA: Diagnosis not present

## 2023-09-30 DIAGNOSIS — Z992 Dependence on renal dialysis: Secondary | ICD-10-CM | POA: Diagnosis not present

## 2023-09-30 DIAGNOSIS — N186 End stage renal disease: Secondary | ICD-10-CM | POA: Diagnosis not present

## 2023-09-30 DIAGNOSIS — D509 Iron deficiency anemia, unspecified: Secondary | ICD-10-CM | POA: Diagnosis not present

## 2023-09-30 DIAGNOSIS — N250 Renal osteodystrophy: Secondary | ICD-10-CM | POA: Diagnosis not present

## 2023-10-03 DIAGNOSIS — N186 End stage renal disease: Secondary | ICD-10-CM | POA: Diagnosis not present

## 2023-10-03 DIAGNOSIS — N250 Renal osteodystrophy: Secondary | ICD-10-CM | POA: Diagnosis not present

## 2023-10-03 DIAGNOSIS — D509 Iron deficiency anemia, unspecified: Secondary | ICD-10-CM | POA: Diagnosis not present

## 2023-10-03 DIAGNOSIS — Z992 Dependence on renal dialysis: Secondary | ICD-10-CM | POA: Diagnosis not present

## 2023-10-05 DIAGNOSIS — Z992 Dependence on renal dialysis: Secondary | ICD-10-CM | POA: Diagnosis not present

## 2023-10-05 DIAGNOSIS — D509 Iron deficiency anemia, unspecified: Secondary | ICD-10-CM | POA: Diagnosis not present

## 2023-10-05 DIAGNOSIS — N250 Renal osteodystrophy: Secondary | ICD-10-CM | POA: Diagnosis not present

## 2023-10-05 DIAGNOSIS — N186 End stage renal disease: Secondary | ICD-10-CM | POA: Diagnosis not present

## 2023-10-07 DIAGNOSIS — Z992 Dependence on renal dialysis: Secondary | ICD-10-CM | POA: Diagnosis not present

## 2023-10-07 DIAGNOSIS — D509 Iron deficiency anemia, unspecified: Secondary | ICD-10-CM | POA: Diagnosis not present

## 2023-10-07 DIAGNOSIS — N186 End stage renal disease: Secondary | ICD-10-CM | POA: Diagnosis not present

## 2023-10-07 DIAGNOSIS — N250 Renal osteodystrophy: Secondary | ICD-10-CM | POA: Diagnosis not present

## 2023-10-10 DIAGNOSIS — N186 End stage renal disease: Secondary | ICD-10-CM | POA: Diagnosis not present

## 2023-10-10 DIAGNOSIS — Z992 Dependence on renal dialysis: Secondary | ICD-10-CM | POA: Diagnosis not present

## 2023-10-10 DIAGNOSIS — N250 Renal osteodystrophy: Secondary | ICD-10-CM | POA: Diagnosis not present

## 2023-10-10 DIAGNOSIS — D509 Iron deficiency anemia, unspecified: Secondary | ICD-10-CM | POA: Diagnosis not present

## 2023-10-12 DIAGNOSIS — D509 Iron deficiency anemia, unspecified: Secondary | ICD-10-CM | POA: Diagnosis not present

## 2023-10-12 DIAGNOSIS — N250 Renal osteodystrophy: Secondary | ICD-10-CM | POA: Diagnosis not present

## 2023-10-12 DIAGNOSIS — N186 End stage renal disease: Secondary | ICD-10-CM | POA: Diagnosis not present

## 2023-10-12 DIAGNOSIS — Z992 Dependence on renal dialysis: Secondary | ICD-10-CM | POA: Diagnosis not present

## 2023-10-14 DIAGNOSIS — Z23 Encounter for immunization: Secondary | ICD-10-CM | POA: Diagnosis not present

## 2023-10-14 DIAGNOSIS — N250 Renal osteodystrophy: Secondary | ICD-10-CM | POA: Diagnosis not present

## 2023-10-14 DIAGNOSIS — D509 Iron deficiency anemia, unspecified: Secondary | ICD-10-CM | POA: Diagnosis not present

## 2023-10-14 DIAGNOSIS — N186 End stage renal disease: Secondary | ICD-10-CM | POA: Diagnosis not present

## 2023-10-14 DIAGNOSIS — Z992 Dependence on renal dialysis: Secondary | ICD-10-CM | POA: Diagnosis not present

## 2023-10-17 DIAGNOSIS — N250 Renal osteodystrophy: Secondary | ICD-10-CM | POA: Diagnosis not present

## 2023-10-17 DIAGNOSIS — Z992 Dependence on renal dialysis: Secondary | ICD-10-CM | POA: Diagnosis not present

## 2023-10-17 DIAGNOSIS — N186 End stage renal disease: Secondary | ICD-10-CM | POA: Diagnosis not present

## 2023-10-17 DIAGNOSIS — Z23 Encounter for immunization: Secondary | ICD-10-CM | POA: Diagnosis not present

## 2023-10-17 DIAGNOSIS — D509 Iron deficiency anemia, unspecified: Secondary | ICD-10-CM | POA: Diagnosis not present

## 2023-10-19 DIAGNOSIS — Z23 Encounter for immunization: Secondary | ICD-10-CM | POA: Diagnosis not present

## 2023-10-19 DIAGNOSIS — Z992 Dependence on renal dialysis: Secondary | ICD-10-CM | POA: Diagnosis not present

## 2023-10-19 DIAGNOSIS — N250 Renal osteodystrophy: Secondary | ICD-10-CM | POA: Diagnosis not present

## 2023-10-19 DIAGNOSIS — D509 Iron deficiency anemia, unspecified: Secondary | ICD-10-CM | POA: Diagnosis not present

## 2023-10-19 DIAGNOSIS — N186 End stage renal disease: Secondary | ICD-10-CM | POA: Diagnosis not present

## 2023-10-21 DIAGNOSIS — N186 End stage renal disease: Secondary | ICD-10-CM | POA: Diagnosis not present

## 2023-10-21 DIAGNOSIS — N250 Renal osteodystrophy: Secondary | ICD-10-CM | POA: Diagnosis not present

## 2023-10-21 DIAGNOSIS — D509 Iron deficiency anemia, unspecified: Secondary | ICD-10-CM | POA: Diagnosis not present

## 2023-10-21 DIAGNOSIS — Z23 Encounter for immunization: Secondary | ICD-10-CM | POA: Diagnosis not present

## 2023-10-21 DIAGNOSIS — Z992 Dependence on renal dialysis: Secondary | ICD-10-CM | POA: Diagnosis not present

## 2023-10-24 DIAGNOSIS — Z992 Dependence on renal dialysis: Secondary | ICD-10-CM | POA: Diagnosis not present

## 2023-10-24 DIAGNOSIS — N186 End stage renal disease: Secondary | ICD-10-CM | POA: Diagnosis not present

## 2023-10-24 DIAGNOSIS — Z23 Encounter for immunization: Secondary | ICD-10-CM | POA: Diagnosis not present

## 2023-10-24 DIAGNOSIS — D509 Iron deficiency anemia, unspecified: Secondary | ICD-10-CM | POA: Diagnosis not present

## 2023-10-24 DIAGNOSIS — N250 Renal osteodystrophy: Secondary | ICD-10-CM | POA: Diagnosis not present

## 2023-10-26 DIAGNOSIS — L820 Inflamed seborrheic keratosis: Secondary | ICD-10-CM | POA: Diagnosis not present

## 2023-10-28 DIAGNOSIS — Z992 Dependence on renal dialysis: Secondary | ICD-10-CM | POA: Diagnosis not present

## 2023-10-28 DIAGNOSIS — N250 Renal osteodystrophy: Secondary | ICD-10-CM | POA: Diagnosis not present

## 2023-10-28 DIAGNOSIS — Z23 Encounter for immunization: Secondary | ICD-10-CM | POA: Diagnosis not present

## 2023-10-28 DIAGNOSIS — N186 End stage renal disease: Secondary | ICD-10-CM | POA: Diagnosis not present

## 2023-10-28 DIAGNOSIS — D509 Iron deficiency anemia, unspecified: Secondary | ICD-10-CM | POA: Diagnosis not present

## 2023-10-31 DIAGNOSIS — Z992 Dependence on renal dialysis: Secondary | ICD-10-CM | POA: Diagnosis not present

## 2023-10-31 DIAGNOSIS — N250 Renal osteodystrophy: Secondary | ICD-10-CM | POA: Diagnosis not present

## 2023-10-31 DIAGNOSIS — Z23 Encounter for immunization: Secondary | ICD-10-CM | POA: Diagnosis not present

## 2023-10-31 DIAGNOSIS — D509 Iron deficiency anemia, unspecified: Secondary | ICD-10-CM | POA: Diagnosis not present

## 2023-10-31 DIAGNOSIS — N186 End stage renal disease: Secondary | ICD-10-CM | POA: Diagnosis not present

## 2023-11-02 DIAGNOSIS — Z992 Dependence on renal dialysis: Secondary | ICD-10-CM | POA: Diagnosis not present

## 2023-11-02 DIAGNOSIS — N250 Renal osteodystrophy: Secondary | ICD-10-CM | POA: Diagnosis not present

## 2023-11-02 DIAGNOSIS — N186 End stage renal disease: Secondary | ICD-10-CM | POA: Diagnosis not present

## 2023-11-02 DIAGNOSIS — Z23 Encounter for immunization: Secondary | ICD-10-CM | POA: Diagnosis not present

## 2023-11-02 DIAGNOSIS — D509 Iron deficiency anemia, unspecified: Secondary | ICD-10-CM | POA: Diagnosis not present

## 2023-11-04 DIAGNOSIS — N186 End stage renal disease: Secondary | ICD-10-CM | POA: Diagnosis not present

## 2023-11-04 DIAGNOSIS — N250 Renal osteodystrophy: Secondary | ICD-10-CM | POA: Diagnosis not present

## 2023-11-04 DIAGNOSIS — D509 Iron deficiency anemia, unspecified: Secondary | ICD-10-CM | POA: Diagnosis not present

## 2023-11-04 DIAGNOSIS — Z992 Dependence on renal dialysis: Secondary | ICD-10-CM | POA: Diagnosis not present

## 2023-11-04 DIAGNOSIS — Z23 Encounter for immunization: Secondary | ICD-10-CM | POA: Diagnosis not present

## 2023-11-07 DIAGNOSIS — Z23 Encounter for immunization: Secondary | ICD-10-CM | POA: Diagnosis not present

## 2023-11-07 DIAGNOSIS — N250 Renal osteodystrophy: Secondary | ICD-10-CM | POA: Diagnosis not present

## 2023-11-07 DIAGNOSIS — N186 End stage renal disease: Secondary | ICD-10-CM | POA: Diagnosis not present

## 2023-11-07 DIAGNOSIS — Z992 Dependence on renal dialysis: Secondary | ICD-10-CM | POA: Diagnosis not present

## 2023-11-07 DIAGNOSIS — D509 Iron deficiency anemia, unspecified: Secondary | ICD-10-CM | POA: Diagnosis not present

## 2023-11-09 DIAGNOSIS — D509 Iron deficiency anemia, unspecified: Secondary | ICD-10-CM | POA: Diagnosis not present

## 2023-11-09 DIAGNOSIS — Z23 Encounter for immunization: Secondary | ICD-10-CM | POA: Diagnosis not present

## 2023-11-09 DIAGNOSIS — N186 End stage renal disease: Secondary | ICD-10-CM | POA: Diagnosis not present

## 2023-11-09 DIAGNOSIS — Z992 Dependence on renal dialysis: Secondary | ICD-10-CM | POA: Diagnosis not present

## 2023-11-09 DIAGNOSIS — N250 Renal osteodystrophy: Secondary | ICD-10-CM | POA: Diagnosis not present

## 2023-11-11 DIAGNOSIS — D509 Iron deficiency anemia, unspecified: Secondary | ICD-10-CM | POA: Diagnosis not present

## 2023-11-11 DIAGNOSIS — N186 End stage renal disease: Secondary | ICD-10-CM | POA: Diagnosis not present

## 2023-11-11 DIAGNOSIS — Z992 Dependence on renal dialysis: Secondary | ICD-10-CM | POA: Diagnosis not present

## 2023-11-11 DIAGNOSIS — N250 Renal osteodystrophy: Secondary | ICD-10-CM | POA: Diagnosis not present

## 2023-11-14 DIAGNOSIS — N186 End stage renal disease: Secondary | ICD-10-CM | POA: Diagnosis not present

## 2023-11-14 DIAGNOSIS — D509 Iron deficiency anemia, unspecified: Secondary | ICD-10-CM | POA: Diagnosis not present

## 2023-11-14 DIAGNOSIS — Z992 Dependence on renal dialysis: Secondary | ICD-10-CM | POA: Diagnosis not present

## 2023-11-14 DIAGNOSIS — N250 Renal osteodystrophy: Secondary | ICD-10-CM | POA: Diagnosis not present

## 2023-11-16 DIAGNOSIS — N186 End stage renal disease: Secondary | ICD-10-CM | POA: Diagnosis not present

## 2023-11-16 DIAGNOSIS — Z992 Dependence on renal dialysis: Secondary | ICD-10-CM | POA: Diagnosis not present

## 2023-11-16 DIAGNOSIS — D509 Iron deficiency anemia, unspecified: Secondary | ICD-10-CM | POA: Diagnosis not present

## 2023-11-16 DIAGNOSIS — N250 Renal osteodystrophy: Secondary | ICD-10-CM | POA: Diagnosis not present

## 2023-11-18 DIAGNOSIS — Z992 Dependence on renal dialysis: Secondary | ICD-10-CM | POA: Diagnosis not present

## 2023-11-18 DIAGNOSIS — N186 End stage renal disease: Secondary | ICD-10-CM | POA: Diagnosis not present

## 2023-11-18 DIAGNOSIS — N250 Renal osteodystrophy: Secondary | ICD-10-CM | POA: Diagnosis not present

## 2023-11-18 DIAGNOSIS — D509 Iron deficiency anemia, unspecified: Secondary | ICD-10-CM | POA: Diagnosis not present

## 2023-11-21 DIAGNOSIS — N186 End stage renal disease: Secondary | ICD-10-CM | POA: Diagnosis not present

## 2023-11-21 DIAGNOSIS — Z992 Dependence on renal dialysis: Secondary | ICD-10-CM | POA: Diagnosis not present

## 2023-11-21 DIAGNOSIS — N250 Renal osteodystrophy: Secondary | ICD-10-CM | POA: Diagnosis not present

## 2023-11-21 DIAGNOSIS — D509 Iron deficiency anemia, unspecified: Secondary | ICD-10-CM | POA: Diagnosis not present

## 2023-11-23 DIAGNOSIS — Z992 Dependence on renal dialysis: Secondary | ICD-10-CM | POA: Diagnosis not present

## 2023-11-23 DIAGNOSIS — N250 Renal osteodystrophy: Secondary | ICD-10-CM | POA: Diagnosis not present

## 2023-11-23 DIAGNOSIS — N186 End stage renal disease: Secondary | ICD-10-CM | POA: Diagnosis not present

## 2023-11-23 DIAGNOSIS — D509 Iron deficiency anemia, unspecified: Secondary | ICD-10-CM | POA: Diagnosis not present

## 2023-11-25 DIAGNOSIS — N250 Renal osteodystrophy: Secondary | ICD-10-CM | POA: Diagnosis not present

## 2023-11-25 DIAGNOSIS — D509 Iron deficiency anemia, unspecified: Secondary | ICD-10-CM | POA: Diagnosis not present

## 2023-11-25 DIAGNOSIS — Z992 Dependence on renal dialysis: Secondary | ICD-10-CM | POA: Diagnosis not present

## 2023-11-25 DIAGNOSIS — N186 End stage renal disease: Secondary | ICD-10-CM | POA: Diagnosis not present

## 2023-11-28 DIAGNOSIS — N250 Renal osteodystrophy: Secondary | ICD-10-CM | POA: Diagnosis not present

## 2023-11-28 DIAGNOSIS — N186 End stage renal disease: Secondary | ICD-10-CM | POA: Diagnosis not present

## 2023-11-28 DIAGNOSIS — Z992 Dependence on renal dialysis: Secondary | ICD-10-CM | POA: Diagnosis not present

## 2023-11-28 DIAGNOSIS — D509 Iron deficiency anemia, unspecified: Secondary | ICD-10-CM | POA: Diagnosis not present

## 2023-11-30 DIAGNOSIS — Z992 Dependence on renal dialysis: Secondary | ICD-10-CM | POA: Diagnosis not present

## 2023-11-30 DIAGNOSIS — N250 Renal osteodystrophy: Secondary | ICD-10-CM | POA: Diagnosis not present

## 2023-11-30 DIAGNOSIS — D509 Iron deficiency anemia, unspecified: Secondary | ICD-10-CM | POA: Diagnosis not present

## 2023-11-30 DIAGNOSIS — N186 End stage renal disease: Secondary | ICD-10-CM | POA: Diagnosis not present

## 2023-12-02 DIAGNOSIS — N186 End stage renal disease: Secondary | ICD-10-CM | POA: Diagnosis not present

## 2023-12-02 DIAGNOSIS — Z992 Dependence on renal dialysis: Secondary | ICD-10-CM | POA: Diagnosis not present

## 2023-12-02 DIAGNOSIS — N250 Renal osteodystrophy: Secondary | ICD-10-CM | POA: Diagnosis not present

## 2023-12-02 DIAGNOSIS — D509 Iron deficiency anemia, unspecified: Secondary | ICD-10-CM | POA: Diagnosis not present

## 2023-12-05 DIAGNOSIS — N186 End stage renal disease: Secondary | ICD-10-CM | POA: Diagnosis not present

## 2023-12-05 DIAGNOSIS — N250 Renal osteodystrophy: Secondary | ICD-10-CM | POA: Diagnosis not present

## 2023-12-05 DIAGNOSIS — D509 Iron deficiency anemia, unspecified: Secondary | ICD-10-CM | POA: Diagnosis not present

## 2023-12-05 DIAGNOSIS — Z992 Dependence on renal dialysis: Secondary | ICD-10-CM | POA: Diagnosis not present

## 2023-12-05 DIAGNOSIS — E119 Type 2 diabetes mellitus without complications: Secondary | ICD-10-CM | POA: Diagnosis not present

## 2023-12-07 DIAGNOSIS — N186 End stage renal disease: Secondary | ICD-10-CM | POA: Diagnosis not present

## 2023-12-07 DIAGNOSIS — D509 Iron deficiency anemia, unspecified: Secondary | ICD-10-CM | POA: Diagnosis not present

## 2023-12-07 DIAGNOSIS — Z992 Dependence on renal dialysis: Secondary | ICD-10-CM | POA: Diagnosis not present

## 2023-12-07 DIAGNOSIS — N250 Renal osteodystrophy: Secondary | ICD-10-CM | POA: Diagnosis not present

## 2023-12-09 DIAGNOSIS — D509 Iron deficiency anemia, unspecified: Secondary | ICD-10-CM | POA: Diagnosis not present

## 2023-12-09 DIAGNOSIS — N186 End stage renal disease: Secondary | ICD-10-CM | POA: Diagnosis not present

## 2023-12-09 DIAGNOSIS — N250 Renal osteodystrophy: Secondary | ICD-10-CM | POA: Diagnosis not present

## 2023-12-09 DIAGNOSIS — Z992 Dependence on renal dialysis: Secondary | ICD-10-CM | POA: Diagnosis not present

## 2023-12-12 DIAGNOSIS — Z992 Dependence on renal dialysis: Secondary | ICD-10-CM | POA: Diagnosis not present

## 2023-12-12 DIAGNOSIS — D509 Iron deficiency anemia, unspecified: Secondary | ICD-10-CM | POA: Diagnosis not present

## 2023-12-12 DIAGNOSIS — N186 End stage renal disease: Secondary | ICD-10-CM | POA: Diagnosis not present

## 2023-12-14 DIAGNOSIS — N186 End stage renal disease: Secondary | ICD-10-CM | POA: Diagnosis not present

## 2023-12-14 DIAGNOSIS — Z992 Dependence on renal dialysis: Secondary | ICD-10-CM | POA: Diagnosis not present

## 2023-12-14 DIAGNOSIS — D509 Iron deficiency anemia, unspecified: Secondary | ICD-10-CM | POA: Diagnosis not present

## 2023-12-15 ENCOUNTER — Other Ambulatory Visit (HOSPITAL_COMMUNITY): Payer: Self-pay | Admitting: Nurse Practitioner

## 2023-12-15 DIAGNOSIS — Z2089 Contact with and (suspected) exposure to other communicable diseases: Secondary | ICD-10-CM

## 2023-12-16 DIAGNOSIS — Z992 Dependence on renal dialysis: Secondary | ICD-10-CM | POA: Diagnosis not present

## 2023-12-16 DIAGNOSIS — D509 Iron deficiency anemia, unspecified: Secondary | ICD-10-CM | POA: Diagnosis not present

## 2023-12-16 DIAGNOSIS — N186 End stage renal disease: Secondary | ICD-10-CM | POA: Diagnosis not present

## 2023-12-19 DIAGNOSIS — Z992 Dependence on renal dialysis: Secondary | ICD-10-CM | POA: Diagnosis not present

## 2023-12-19 DIAGNOSIS — D509 Iron deficiency anemia, unspecified: Secondary | ICD-10-CM | POA: Diagnosis not present

## 2023-12-19 DIAGNOSIS — N186 End stage renal disease: Secondary | ICD-10-CM | POA: Diagnosis not present

## 2023-12-21 DIAGNOSIS — N186 End stage renal disease: Secondary | ICD-10-CM | POA: Diagnosis not present

## 2023-12-21 DIAGNOSIS — D509 Iron deficiency anemia, unspecified: Secondary | ICD-10-CM | POA: Diagnosis not present

## 2023-12-21 DIAGNOSIS — Z992 Dependence on renal dialysis: Secondary | ICD-10-CM | POA: Diagnosis not present

## 2023-12-23 DIAGNOSIS — Z992 Dependence on renal dialysis: Secondary | ICD-10-CM | POA: Diagnosis not present

## 2023-12-23 DIAGNOSIS — D509 Iron deficiency anemia, unspecified: Secondary | ICD-10-CM | POA: Diagnosis not present

## 2023-12-23 DIAGNOSIS — N186 End stage renal disease: Secondary | ICD-10-CM | POA: Diagnosis not present

## 2023-12-26 DIAGNOSIS — D509 Iron deficiency anemia, unspecified: Secondary | ICD-10-CM | POA: Diagnosis not present

## 2023-12-26 DIAGNOSIS — N186 End stage renal disease: Secondary | ICD-10-CM | POA: Diagnosis not present

## 2023-12-26 DIAGNOSIS — Z992 Dependence on renal dialysis: Secondary | ICD-10-CM | POA: Diagnosis not present

## 2023-12-28 DIAGNOSIS — D509 Iron deficiency anemia, unspecified: Secondary | ICD-10-CM | POA: Diagnosis not present

## 2023-12-28 DIAGNOSIS — Z992 Dependence on renal dialysis: Secondary | ICD-10-CM | POA: Diagnosis not present

## 2023-12-28 DIAGNOSIS — N186 End stage renal disease: Secondary | ICD-10-CM | POA: Diagnosis not present

## 2023-12-29 ENCOUNTER — Ambulatory Visit (HOSPITAL_COMMUNITY)
Admission: RE | Admit: 2023-12-29 | Discharge: 2023-12-29 | Disposition: A | Discharge status: Home / Self Care | Source: Ambulatory Visit | Attending: Nurse Practitioner | Admitting: Nurse Practitioner

## 2023-12-29 DIAGNOSIS — Z4682 Encounter for fitting and adjustment of non-vascular catheter: Secondary | ICD-10-CM | POA: Diagnosis not present

## 2023-12-29 DIAGNOSIS — Z2089 Contact with and (suspected) exposure to other communicable diseases: Secondary | ICD-10-CM | POA: Diagnosis not present

## 2023-12-29 DIAGNOSIS — R059 Cough, unspecified: Secondary | ICD-10-CM | POA: Diagnosis not present

## 2023-12-30 DIAGNOSIS — D509 Iron deficiency anemia, unspecified: Secondary | ICD-10-CM | POA: Diagnosis not present

## 2023-12-30 DIAGNOSIS — N186 End stage renal disease: Secondary | ICD-10-CM | POA: Diagnosis not present

## 2023-12-30 DIAGNOSIS — Z992 Dependence on renal dialysis: Secondary | ICD-10-CM | POA: Diagnosis not present

## 2024-01-02 DIAGNOSIS — N186 End stage renal disease: Secondary | ICD-10-CM | POA: Diagnosis not present

## 2024-01-02 DIAGNOSIS — D509 Iron deficiency anemia, unspecified: Secondary | ICD-10-CM | POA: Diagnosis not present

## 2024-01-02 DIAGNOSIS — Z992 Dependence on renal dialysis: Secondary | ICD-10-CM | POA: Diagnosis not present

## 2024-01-04 DIAGNOSIS — N186 End stage renal disease: Secondary | ICD-10-CM | POA: Diagnosis not present

## 2024-01-04 DIAGNOSIS — D509 Iron deficiency anemia, unspecified: Secondary | ICD-10-CM | POA: Diagnosis not present

## 2024-01-04 DIAGNOSIS — Z992 Dependence on renal dialysis: Secondary | ICD-10-CM | POA: Diagnosis not present

## 2024-01-06 DIAGNOSIS — D509 Iron deficiency anemia, unspecified: Secondary | ICD-10-CM | POA: Diagnosis not present

## 2024-01-06 DIAGNOSIS — Z992 Dependence on renal dialysis: Secondary | ICD-10-CM | POA: Diagnosis not present

## 2024-01-06 DIAGNOSIS — N186 End stage renal disease: Secondary | ICD-10-CM | POA: Diagnosis not present

## 2024-01-09 DIAGNOSIS — N186 End stage renal disease: Secondary | ICD-10-CM | POA: Diagnosis not present

## 2024-01-09 DIAGNOSIS — Z992 Dependence on renal dialysis: Secondary | ICD-10-CM | POA: Diagnosis not present

## 2024-01-09 DIAGNOSIS — D509 Iron deficiency anemia, unspecified: Secondary | ICD-10-CM | POA: Diagnosis not present

## 2024-01-11 DIAGNOSIS — D509 Iron deficiency anemia, unspecified: Secondary | ICD-10-CM | POA: Diagnosis not present

## 2024-01-11 DIAGNOSIS — N250 Renal osteodystrophy: Secondary | ICD-10-CM | POA: Diagnosis not present

## 2024-01-11 DIAGNOSIS — Z992 Dependence on renal dialysis: Secondary | ICD-10-CM | POA: Diagnosis not present

## 2024-01-11 DIAGNOSIS — N186 End stage renal disease: Secondary | ICD-10-CM | POA: Diagnosis not present

## 2024-01-13 DIAGNOSIS — Z992 Dependence on renal dialysis: Secondary | ICD-10-CM | POA: Diagnosis not present

## 2024-01-13 DIAGNOSIS — D509 Iron deficiency anemia, unspecified: Secondary | ICD-10-CM | POA: Diagnosis not present

## 2024-01-13 DIAGNOSIS — N250 Renal osteodystrophy: Secondary | ICD-10-CM | POA: Diagnosis not present

## 2024-01-13 DIAGNOSIS — N186 End stage renal disease: Secondary | ICD-10-CM | POA: Diagnosis not present

## 2024-01-16 DIAGNOSIS — Z992 Dependence on renal dialysis: Secondary | ICD-10-CM | POA: Diagnosis not present

## 2024-01-16 DIAGNOSIS — N250 Renal osteodystrophy: Secondary | ICD-10-CM | POA: Diagnosis not present

## 2024-01-16 DIAGNOSIS — D509 Iron deficiency anemia, unspecified: Secondary | ICD-10-CM | POA: Diagnosis not present

## 2024-01-16 DIAGNOSIS — N186 End stage renal disease: Secondary | ICD-10-CM | POA: Diagnosis not present

## 2024-01-18 DIAGNOSIS — N186 End stage renal disease: Secondary | ICD-10-CM | POA: Diagnosis not present

## 2024-01-18 DIAGNOSIS — N250 Renal osteodystrophy: Secondary | ICD-10-CM | POA: Diagnosis not present

## 2024-01-18 DIAGNOSIS — D509 Iron deficiency anemia, unspecified: Secondary | ICD-10-CM | POA: Diagnosis not present

## 2024-01-18 DIAGNOSIS — Z992 Dependence on renal dialysis: Secondary | ICD-10-CM | POA: Diagnosis not present

## 2024-01-20 DIAGNOSIS — N186 End stage renal disease: Secondary | ICD-10-CM | POA: Diagnosis not present

## 2024-01-20 DIAGNOSIS — N250 Renal osteodystrophy: Secondary | ICD-10-CM | POA: Diagnosis not present

## 2024-01-20 DIAGNOSIS — Z992 Dependence on renal dialysis: Secondary | ICD-10-CM | POA: Diagnosis not present

## 2024-01-20 DIAGNOSIS — D509 Iron deficiency anemia, unspecified: Secondary | ICD-10-CM | POA: Diagnosis not present

## 2024-01-23 DIAGNOSIS — N186 End stage renal disease: Secondary | ICD-10-CM | POA: Diagnosis not present

## 2024-01-23 DIAGNOSIS — N250 Renal osteodystrophy: Secondary | ICD-10-CM | POA: Diagnosis not present

## 2024-01-23 DIAGNOSIS — Z992 Dependence on renal dialysis: Secondary | ICD-10-CM | POA: Diagnosis not present

## 2024-01-23 DIAGNOSIS — D509 Iron deficiency anemia, unspecified: Secondary | ICD-10-CM | POA: Diagnosis not present

## 2024-01-25 DIAGNOSIS — N186 End stage renal disease: Secondary | ICD-10-CM | POA: Diagnosis not present

## 2024-01-25 DIAGNOSIS — Z992 Dependence on renal dialysis: Secondary | ICD-10-CM | POA: Diagnosis not present

## 2024-01-25 DIAGNOSIS — D509 Iron deficiency anemia, unspecified: Secondary | ICD-10-CM | POA: Diagnosis not present

## 2024-01-25 DIAGNOSIS — N250 Renal osteodystrophy: Secondary | ICD-10-CM | POA: Diagnosis not present

## 2024-01-27 DIAGNOSIS — D509 Iron deficiency anemia, unspecified: Secondary | ICD-10-CM | POA: Diagnosis not present

## 2024-01-27 DIAGNOSIS — Z992 Dependence on renal dialysis: Secondary | ICD-10-CM | POA: Diagnosis not present

## 2024-01-27 DIAGNOSIS — N186 End stage renal disease: Secondary | ICD-10-CM | POA: Diagnosis not present

## 2024-01-27 DIAGNOSIS — N250 Renal osteodystrophy: Secondary | ICD-10-CM | POA: Diagnosis not present

## 2024-01-30 DIAGNOSIS — N250 Renal osteodystrophy: Secondary | ICD-10-CM | POA: Diagnosis not present

## 2024-01-30 DIAGNOSIS — Z992 Dependence on renal dialysis: Secondary | ICD-10-CM | POA: Diagnosis not present

## 2024-01-30 DIAGNOSIS — D509 Iron deficiency anemia, unspecified: Secondary | ICD-10-CM | POA: Diagnosis not present

## 2024-01-30 DIAGNOSIS — N186 End stage renal disease: Secondary | ICD-10-CM | POA: Diagnosis not present

## 2024-02-01 DIAGNOSIS — Z992 Dependence on renal dialysis: Secondary | ICD-10-CM | POA: Diagnosis not present

## 2024-02-01 DIAGNOSIS — N186 End stage renal disease: Secondary | ICD-10-CM | POA: Diagnosis not present

## 2024-02-01 DIAGNOSIS — N250 Renal osteodystrophy: Secondary | ICD-10-CM | POA: Diagnosis not present

## 2024-02-01 DIAGNOSIS — D509 Iron deficiency anemia, unspecified: Secondary | ICD-10-CM | POA: Diagnosis not present

## 2024-02-03 DIAGNOSIS — N250 Renal osteodystrophy: Secondary | ICD-10-CM | POA: Diagnosis not present

## 2024-02-03 DIAGNOSIS — D509 Iron deficiency anemia, unspecified: Secondary | ICD-10-CM | POA: Diagnosis not present

## 2024-02-03 DIAGNOSIS — N186 End stage renal disease: Secondary | ICD-10-CM | POA: Diagnosis not present

## 2024-02-03 DIAGNOSIS — Z992 Dependence on renal dialysis: Secondary | ICD-10-CM | POA: Diagnosis not present

## 2024-02-06 DIAGNOSIS — N186 End stage renal disease: Secondary | ICD-10-CM | POA: Diagnosis not present

## 2024-02-06 DIAGNOSIS — Z992 Dependence on renal dialysis: Secondary | ICD-10-CM | POA: Diagnosis not present

## 2024-02-06 DIAGNOSIS — N250 Renal osteodystrophy: Secondary | ICD-10-CM | POA: Diagnosis not present

## 2024-02-06 DIAGNOSIS — D509 Iron deficiency anemia, unspecified: Secondary | ICD-10-CM | POA: Diagnosis not present

## 2024-02-08 DIAGNOSIS — N250 Renal osteodystrophy: Secondary | ICD-10-CM | POA: Diagnosis not present

## 2024-02-08 DIAGNOSIS — N186 End stage renal disease: Secondary | ICD-10-CM | POA: Diagnosis not present

## 2024-02-08 DIAGNOSIS — D509 Iron deficiency anemia, unspecified: Secondary | ICD-10-CM | POA: Diagnosis not present

## 2024-02-08 DIAGNOSIS — Z992 Dependence on renal dialysis: Secondary | ICD-10-CM | POA: Diagnosis not present

## 2024-02-10 DIAGNOSIS — D509 Iron deficiency anemia, unspecified: Secondary | ICD-10-CM | POA: Diagnosis not present

## 2024-02-10 DIAGNOSIS — N250 Renal osteodystrophy: Secondary | ICD-10-CM | POA: Diagnosis not present

## 2024-02-10 DIAGNOSIS — Z992 Dependence on renal dialysis: Secondary | ICD-10-CM | POA: Diagnosis not present

## 2024-02-10 DIAGNOSIS — N186 End stage renal disease: Secondary | ICD-10-CM | POA: Diagnosis not present

## 2024-02-11 DIAGNOSIS — N186 End stage renal disease: Secondary | ICD-10-CM | POA: Diagnosis not present

## 2024-02-11 DIAGNOSIS — Z992 Dependence on renal dialysis: Secondary | ICD-10-CM | POA: Diagnosis not present

## 2024-02-13 DIAGNOSIS — D509 Iron deficiency anemia, unspecified: Secondary | ICD-10-CM | POA: Diagnosis not present

## 2024-02-13 DIAGNOSIS — N186 End stage renal disease: Secondary | ICD-10-CM | POA: Diagnosis not present

## 2024-02-13 DIAGNOSIS — N250 Renal osteodystrophy: Secondary | ICD-10-CM | POA: Diagnosis not present

## 2024-02-13 DIAGNOSIS — Z992 Dependence on renal dialysis: Secondary | ICD-10-CM | POA: Diagnosis not present

## 2024-02-15 DIAGNOSIS — Z992 Dependence on renal dialysis: Secondary | ICD-10-CM | POA: Diagnosis not present

## 2024-02-15 DIAGNOSIS — N250 Renal osteodystrophy: Secondary | ICD-10-CM | POA: Diagnosis not present

## 2024-02-15 DIAGNOSIS — N186 End stage renal disease: Secondary | ICD-10-CM | POA: Diagnosis not present

## 2024-02-15 DIAGNOSIS — D509 Iron deficiency anemia, unspecified: Secondary | ICD-10-CM | POA: Diagnosis not present

## 2024-02-17 DIAGNOSIS — N186 End stage renal disease: Secondary | ICD-10-CM | POA: Diagnosis not present

## 2024-02-17 DIAGNOSIS — D509 Iron deficiency anemia, unspecified: Secondary | ICD-10-CM | POA: Diagnosis not present

## 2024-02-17 DIAGNOSIS — N250 Renal osteodystrophy: Secondary | ICD-10-CM | POA: Diagnosis not present

## 2024-02-17 DIAGNOSIS — Z992 Dependence on renal dialysis: Secondary | ICD-10-CM | POA: Diagnosis not present

## 2024-02-20 DIAGNOSIS — N250 Renal osteodystrophy: Secondary | ICD-10-CM | POA: Diagnosis not present

## 2024-02-20 DIAGNOSIS — Z992 Dependence on renal dialysis: Secondary | ICD-10-CM | POA: Diagnosis not present

## 2024-02-20 DIAGNOSIS — N186 End stage renal disease: Secondary | ICD-10-CM | POA: Diagnosis not present

## 2024-02-20 DIAGNOSIS — D509 Iron deficiency anemia, unspecified: Secondary | ICD-10-CM | POA: Diagnosis not present

## 2024-02-22 DIAGNOSIS — Z992 Dependence on renal dialysis: Secondary | ICD-10-CM | POA: Diagnosis not present

## 2024-02-22 DIAGNOSIS — N250 Renal osteodystrophy: Secondary | ICD-10-CM | POA: Diagnosis not present

## 2024-02-22 DIAGNOSIS — D509 Iron deficiency anemia, unspecified: Secondary | ICD-10-CM | POA: Diagnosis not present

## 2024-02-22 DIAGNOSIS — N186 End stage renal disease: Secondary | ICD-10-CM | POA: Diagnosis not present

## 2024-02-24 DIAGNOSIS — D509 Iron deficiency anemia, unspecified: Secondary | ICD-10-CM | POA: Diagnosis not present

## 2024-02-24 DIAGNOSIS — N186 End stage renal disease: Secondary | ICD-10-CM | POA: Diagnosis not present

## 2024-02-24 DIAGNOSIS — Z992 Dependence on renal dialysis: Secondary | ICD-10-CM | POA: Diagnosis not present

## 2024-02-24 DIAGNOSIS — N250 Renal osteodystrophy: Secondary | ICD-10-CM | POA: Diagnosis not present

## 2024-02-27 ENCOUNTER — Other Ambulatory Visit: Payer: Self-pay

## 2024-02-27 DIAGNOSIS — N186 End stage renal disease: Secondary | ICD-10-CM | POA: Diagnosis not present

## 2024-02-27 DIAGNOSIS — N250 Renal osteodystrophy: Secondary | ICD-10-CM | POA: Diagnosis not present

## 2024-02-27 DIAGNOSIS — Z992 Dependence on renal dialysis: Secondary | ICD-10-CM | POA: Diagnosis not present

## 2024-02-27 DIAGNOSIS — D509 Iron deficiency anemia, unspecified: Secondary | ICD-10-CM | POA: Diagnosis not present

## 2024-02-27 DIAGNOSIS — I779 Disorder of arteries and arterioles, unspecified: Secondary | ICD-10-CM

## 2024-02-27 MED ORDER — CILOSTAZOL 100 MG PO TABS
ORAL_TABLET | ORAL | 11 refills | Status: AC
Start: 2024-02-27 — End: ?

## 2024-02-29 DIAGNOSIS — N186 End stage renal disease: Secondary | ICD-10-CM | POA: Diagnosis not present

## 2024-02-29 DIAGNOSIS — Z992 Dependence on renal dialysis: Secondary | ICD-10-CM | POA: Diagnosis not present

## 2024-02-29 DIAGNOSIS — D509 Iron deficiency anemia, unspecified: Secondary | ICD-10-CM | POA: Diagnosis not present

## 2024-02-29 DIAGNOSIS — N250 Renal osteodystrophy: Secondary | ICD-10-CM | POA: Diagnosis not present

## 2024-03-02 DIAGNOSIS — D509 Iron deficiency anemia, unspecified: Secondary | ICD-10-CM | POA: Diagnosis not present

## 2024-03-02 DIAGNOSIS — N250 Renal osteodystrophy: Secondary | ICD-10-CM | POA: Diagnosis not present

## 2024-03-02 DIAGNOSIS — Z992 Dependence on renal dialysis: Secondary | ICD-10-CM | POA: Diagnosis not present

## 2024-03-02 DIAGNOSIS — N186 End stage renal disease: Secondary | ICD-10-CM | POA: Diagnosis not present

## 2024-03-05 DIAGNOSIS — N186 End stage renal disease: Secondary | ICD-10-CM | POA: Diagnosis not present

## 2024-03-05 DIAGNOSIS — D509 Iron deficiency anemia, unspecified: Secondary | ICD-10-CM | POA: Diagnosis not present

## 2024-03-05 DIAGNOSIS — E119 Type 2 diabetes mellitus without complications: Secondary | ICD-10-CM | POA: Diagnosis not present

## 2024-03-05 DIAGNOSIS — Z992 Dependence on renal dialysis: Secondary | ICD-10-CM | POA: Diagnosis not present

## 2024-03-05 DIAGNOSIS — N250 Renal osteodystrophy: Secondary | ICD-10-CM | POA: Diagnosis not present

## 2024-03-07 DIAGNOSIS — D509 Iron deficiency anemia, unspecified: Secondary | ICD-10-CM | POA: Diagnosis not present

## 2024-03-07 DIAGNOSIS — Z992 Dependence on renal dialysis: Secondary | ICD-10-CM | POA: Diagnosis not present

## 2024-03-07 DIAGNOSIS — N250 Renal osteodystrophy: Secondary | ICD-10-CM | POA: Diagnosis not present

## 2024-03-07 DIAGNOSIS — N186 End stage renal disease: Secondary | ICD-10-CM | POA: Diagnosis not present

## 2024-03-09 DIAGNOSIS — D509 Iron deficiency anemia, unspecified: Secondary | ICD-10-CM | POA: Diagnosis not present

## 2024-03-09 DIAGNOSIS — N186 End stage renal disease: Secondary | ICD-10-CM | POA: Diagnosis not present

## 2024-03-09 DIAGNOSIS — Z992 Dependence on renal dialysis: Secondary | ICD-10-CM | POA: Diagnosis not present

## 2024-03-09 DIAGNOSIS — N250 Renal osteodystrophy: Secondary | ICD-10-CM | POA: Diagnosis not present

## 2024-03-12 DIAGNOSIS — Z992 Dependence on renal dialysis: Secondary | ICD-10-CM | POA: Diagnosis not present

## 2024-03-12 DIAGNOSIS — D509 Iron deficiency anemia, unspecified: Secondary | ICD-10-CM | POA: Diagnosis not present

## 2024-03-12 DIAGNOSIS — N186 End stage renal disease: Secondary | ICD-10-CM | POA: Diagnosis not present

## 2024-03-12 DIAGNOSIS — N250 Renal osteodystrophy: Secondary | ICD-10-CM | POA: Diagnosis not present

## 2024-03-14 DIAGNOSIS — N250 Renal osteodystrophy: Secondary | ICD-10-CM | POA: Diagnosis not present

## 2024-03-14 DIAGNOSIS — N186 End stage renal disease: Secondary | ICD-10-CM | POA: Diagnosis not present

## 2024-03-14 DIAGNOSIS — D509 Iron deficiency anemia, unspecified: Secondary | ICD-10-CM | POA: Diagnosis not present

## 2024-03-14 DIAGNOSIS — Z992 Dependence on renal dialysis: Secondary | ICD-10-CM | POA: Diagnosis not present

## 2024-03-16 DIAGNOSIS — D509 Iron deficiency anemia, unspecified: Secondary | ICD-10-CM | POA: Diagnosis not present

## 2024-03-16 DIAGNOSIS — N186 End stage renal disease: Secondary | ICD-10-CM | POA: Diagnosis not present

## 2024-03-16 DIAGNOSIS — Z992 Dependence on renal dialysis: Secondary | ICD-10-CM | POA: Diagnosis not present

## 2024-03-16 DIAGNOSIS — N250 Renal osteodystrophy: Secondary | ICD-10-CM | POA: Diagnosis not present

## 2024-03-19 DIAGNOSIS — N250 Renal osteodystrophy: Secondary | ICD-10-CM | POA: Diagnosis not present

## 2024-03-19 DIAGNOSIS — N186 End stage renal disease: Secondary | ICD-10-CM | POA: Diagnosis not present

## 2024-03-19 DIAGNOSIS — Z992 Dependence on renal dialysis: Secondary | ICD-10-CM | POA: Diagnosis not present

## 2024-03-19 DIAGNOSIS — D509 Iron deficiency anemia, unspecified: Secondary | ICD-10-CM | POA: Diagnosis not present

## 2024-03-21 DIAGNOSIS — N250 Renal osteodystrophy: Secondary | ICD-10-CM | POA: Diagnosis not present

## 2024-03-21 DIAGNOSIS — D509 Iron deficiency anemia, unspecified: Secondary | ICD-10-CM | POA: Diagnosis not present

## 2024-03-21 DIAGNOSIS — Z992 Dependence on renal dialysis: Secondary | ICD-10-CM | POA: Diagnosis not present

## 2024-03-21 DIAGNOSIS — N186 End stage renal disease: Secondary | ICD-10-CM | POA: Diagnosis not present

## 2024-03-23 DIAGNOSIS — D509 Iron deficiency anemia, unspecified: Secondary | ICD-10-CM | POA: Diagnosis not present

## 2024-03-23 DIAGNOSIS — N186 End stage renal disease: Secondary | ICD-10-CM | POA: Diagnosis not present

## 2024-03-23 DIAGNOSIS — N250 Renal osteodystrophy: Secondary | ICD-10-CM | POA: Diagnosis not present

## 2024-03-23 DIAGNOSIS — Z992 Dependence on renal dialysis: Secondary | ICD-10-CM | POA: Diagnosis not present

## 2024-03-26 DIAGNOSIS — D509 Iron deficiency anemia, unspecified: Secondary | ICD-10-CM | POA: Diagnosis not present

## 2024-03-26 DIAGNOSIS — N186 End stage renal disease: Secondary | ICD-10-CM | POA: Diagnosis not present

## 2024-03-26 DIAGNOSIS — N250 Renal osteodystrophy: Secondary | ICD-10-CM | POA: Diagnosis not present

## 2024-03-26 DIAGNOSIS — Z992 Dependence on renal dialysis: Secondary | ICD-10-CM | POA: Diagnosis not present

## 2024-03-28 DIAGNOSIS — N186 End stage renal disease: Secondary | ICD-10-CM | POA: Diagnosis not present

## 2024-03-28 DIAGNOSIS — N250 Renal osteodystrophy: Secondary | ICD-10-CM | POA: Diagnosis not present

## 2024-03-28 DIAGNOSIS — Z992 Dependence on renal dialysis: Secondary | ICD-10-CM | POA: Diagnosis not present

## 2024-03-28 DIAGNOSIS — D509 Iron deficiency anemia, unspecified: Secondary | ICD-10-CM | POA: Diagnosis not present

## 2024-03-30 DIAGNOSIS — Z992 Dependence on renal dialysis: Secondary | ICD-10-CM | POA: Diagnosis not present

## 2024-03-30 DIAGNOSIS — N186 End stage renal disease: Secondary | ICD-10-CM | POA: Diagnosis not present

## 2024-03-30 DIAGNOSIS — D509 Iron deficiency anemia, unspecified: Secondary | ICD-10-CM | POA: Diagnosis not present

## 2024-03-30 DIAGNOSIS — N250 Renal osteodystrophy: Secondary | ICD-10-CM | POA: Diagnosis not present

## 2024-04-02 DIAGNOSIS — N186 End stage renal disease: Secondary | ICD-10-CM | POA: Diagnosis not present

## 2024-04-02 DIAGNOSIS — Z992 Dependence on renal dialysis: Secondary | ICD-10-CM | POA: Diagnosis not present

## 2024-04-02 DIAGNOSIS — D509 Iron deficiency anemia, unspecified: Secondary | ICD-10-CM | POA: Diagnosis not present

## 2024-04-02 DIAGNOSIS — N250 Renal osteodystrophy: Secondary | ICD-10-CM | POA: Diagnosis not present

## 2024-04-04 DIAGNOSIS — D509 Iron deficiency anemia, unspecified: Secondary | ICD-10-CM | POA: Diagnosis not present

## 2024-04-04 DIAGNOSIS — Z992 Dependence on renal dialysis: Secondary | ICD-10-CM | POA: Diagnosis not present

## 2024-04-04 DIAGNOSIS — N186 End stage renal disease: Secondary | ICD-10-CM | POA: Diagnosis not present

## 2024-04-04 DIAGNOSIS — N250 Renal osteodystrophy: Secondary | ICD-10-CM | POA: Diagnosis not present

## 2024-04-06 DIAGNOSIS — D509 Iron deficiency anemia, unspecified: Secondary | ICD-10-CM | POA: Diagnosis not present

## 2024-04-06 DIAGNOSIS — N250 Renal osteodystrophy: Secondary | ICD-10-CM | POA: Diagnosis not present

## 2024-04-06 DIAGNOSIS — Z992 Dependence on renal dialysis: Secondary | ICD-10-CM | POA: Diagnosis not present

## 2024-04-06 DIAGNOSIS — N186 End stage renal disease: Secondary | ICD-10-CM | POA: Diagnosis not present

## 2024-04-09 DIAGNOSIS — Z992 Dependence on renal dialysis: Secondary | ICD-10-CM | POA: Diagnosis not present

## 2024-04-09 DIAGNOSIS — N186 End stage renal disease: Secondary | ICD-10-CM | POA: Diagnosis not present

## 2024-04-09 DIAGNOSIS — D509 Iron deficiency anemia, unspecified: Secondary | ICD-10-CM | POA: Diagnosis not present

## 2024-04-09 DIAGNOSIS — N250 Renal osteodystrophy: Secondary | ICD-10-CM | POA: Diagnosis not present

## 2024-04-11 DIAGNOSIS — D509 Iron deficiency anemia, unspecified: Secondary | ICD-10-CM | POA: Diagnosis not present

## 2024-04-11 DIAGNOSIS — Z992 Dependence on renal dialysis: Secondary | ICD-10-CM | POA: Diagnosis not present

## 2024-04-11 DIAGNOSIS — N250 Renal osteodystrophy: Secondary | ICD-10-CM | POA: Diagnosis not present

## 2024-04-11 DIAGNOSIS — N186 End stage renal disease: Secondary | ICD-10-CM | POA: Diagnosis not present

## 2024-04-12 DIAGNOSIS — N186 End stage renal disease: Secondary | ICD-10-CM | POA: Diagnosis not present

## 2024-04-12 DIAGNOSIS — Z992 Dependence on renal dialysis: Secondary | ICD-10-CM | POA: Diagnosis not present

## 2024-04-13 DIAGNOSIS — N250 Renal osteodystrophy: Secondary | ICD-10-CM | POA: Diagnosis not present

## 2024-04-13 DIAGNOSIS — D509 Iron deficiency anemia, unspecified: Secondary | ICD-10-CM | POA: Diagnosis not present

## 2024-04-13 DIAGNOSIS — N186 End stage renal disease: Secondary | ICD-10-CM | POA: Diagnosis not present

## 2024-04-13 DIAGNOSIS — Z992 Dependence on renal dialysis: Secondary | ICD-10-CM | POA: Diagnosis not present

## 2024-04-16 DIAGNOSIS — Z992 Dependence on renal dialysis: Secondary | ICD-10-CM | POA: Diagnosis not present

## 2024-04-16 DIAGNOSIS — N250 Renal osteodystrophy: Secondary | ICD-10-CM | POA: Diagnosis not present

## 2024-04-16 DIAGNOSIS — N186 End stage renal disease: Secondary | ICD-10-CM | POA: Diagnosis not present

## 2024-04-16 DIAGNOSIS — D509 Iron deficiency anemia, unspecified: Secondary | ICD-10-CM | POA: Diagnosis not present

## 2024-04-18 DIAGNOSIS — Z992 Dependence on renal dialysis: Secondary | ICD-10-CM | POA: Diagnosis not present

## 2024-04-18 DIAGNOSIS — D509 Iron deficiency anemia, unspecified: Secondary | ICD-10-CM | POA: Diagnosis not present

## 2024-04-18 DIAGNOSIS — N250 Renal osteodystrophy: Secondary | ICD-10-CM | POA: Diagnosis not present

## 2024-04-18 DIAGNOSIS — N186 End stage renal disease: Secondary | ICD-10-CM | POA: Diagnosis not present

## 2024-04-20 DIAGNOSIS — D509 Iron deficiency anemia, unspecified: Secondary | ICD-10-CM | POA: Diagnosis not present

## 2024-04-20 DIAGNOSIS — N186 End stage renal disease: Secondary | ICD-10-CM | POA: Diagnosis not present

## 2024-04-20 DIAGNOSIS — N250 Renal osteodystrophy: Secondary | ICD-10-CM | POA: Diagnosis not present

## 2024-04-20 DIAGNOSIS — Z992 Dependence on renal dialysis: Secondary | ICD-10-CM | POA: Diagnosis not present

## 2024-04-23 DIAGNOSIS — N186 End stage renal disease: Secondary | ICD-10-CM | POA: Diagnosis not present

## 2024-04-23 DIAGNOSIS — D509 Iron deficiency anemia, unspecified: Secondary | ICD-10-CM | POA: Diagnosis not present

## 2024-04-23 DIAGNOSIS — Z992 Dependence on renal dialysis: Secondary | ICD-10-CM | POA: Diagnosis not present

## 2024-04-23 DIAGNOSIS — N250 Renal osteodystrophy: Secondary | ICD-10-CM | POA: Diagnosis not present

## 2024-04-25 DIAGNOSIS — N250 Renal osteodystrophy: Secondary | ICD-10-CM | POA: Diagnosis not present

## 2024-04-25 DIAGNOSIS — D509 Iron deficiency anemia, unspecified: Secondary | ICD-10-CM | POA: Diagnosis not present

## 2024-04-25 DIAGNOSIS — N186 End stage renal disease: Secondary | ICD-10-CM | POA: Diagnosis not present

## 2024-04-25 DIAGNOSIS — Z992 Dependence on renal dialysis: Secondary | ICD-10-CM | POA: Diagnosis not present

## 2024-04-27 DIAGNOSIS — N250 Renal osteodystrophy: Secondary | ICD-10-CM | POA: Diagnosis not present

## 2024-04-27 DIAGNOSIS — D509 Iron deficiency anemia, unspecified: Secondary | ICD-10-CM | POA: Diagnosis not present

## 2024-04-27 DIAGNOSIS — N186 End stage renal disease: Secondary | ICD-10-CM | POA: Diagnosis not present

## 2024-04-27 DIAGNOSIS — Z992 Dependence on renal dialysis: Secondary | ICD-10-CM | POA: Diagnosis not present

## 2024-04-30 DIAGNOSIS — D509 Iron deficiency anemia, unspecified: Secondary | ICD-10-CM | POA: Diagnosis not present

## 2024-04-30 DIAGNOSIS — N186 End stage renal disease: Secondary | ICD-10-CM | POA: Diagnosis not present

## 2024-04-30 DIAGNOSIS — N250 Renal osteodystrophy: Secondary | ICD-10-CM | POA: Diagnosis not present

## 2024-04-30 DIAGNOSIS — Z992 Dependence on renal dialysis: Secondary | ICD-10-CM | POA: Diagnosis not present

## 2024-05-02 DIAGNOSIS — D509 Iron deficiency anemia, unspecified: Secondary | ICD-10-CM | POA: Diagnosis not present

## 2024-05-02 DIAGNOSIS — N186 End stage renal disease: Secondary | ICD-10-CM | POA: Diagnosis not present

## 2024-05-02 DIAGNOSIS — N250 Renal osteodystrophy: Secondary | ICD-10-CM | POA: Diagnosis not present

## 2024-05-02 DIAGNOSIS — Z992 Dependence on renal dialysis: Secondary | ICD-10-CM | POA: Diagnosis not present

## 2024-05-04 DIAGNOSIS — D509 Iron deficiency anemia, unspecified: Secondary | ICD-10-CM | POA: Diagnosis not present

## 2024-05-04 DIAGNOSIS — Z992 Dependence on renal dialysis: Secondary | ICD-10-CM | POA: Diagnosis not present

## 2024-05-04 DIAGNOSIS — N186 End stage renal disease: Secondary | ICD-10-CM | POA: Diagnosis not present

## 2024-05-04 DIAGNOSIS — N250 Renal osteodystrophy: Secondary | ICD-10-CM | POA: Diagnosis not present

## 2024-05-07 DIAGNOSIS — Z992 Dependence on renal dialysis: Secondary | ICD-10-CM | POA: Diagnosis not present

## 2024-05-07 DIAGNOSIS — N186 End stage renal disease: Secondary | ICD-10-CM | POA: Diagnosis not present

## 2024-05-07 DIAGNOSIS — N250 Renal osteodystrophy: Secondary | ICD-10-CM | POA: Diagnosis not present

## 2024-05-07 DIAGNOSIS — D509 Iron deficiency anemia, unspecified: Secondary | ICD-10-CM | POA: Diagnosis not present

## 2024-05-09 DIAGNOSIS — N250 Renal osteodystrophy: Secondary | ICD-10-CM | POA: Diagnosis not present

## 2024-05-09 DIAGNOSIS — D509 Iron deficiency anemia, unspecified: Secondary | ICD-10-CM | POA: Diagnosis not present

## 2024-05-09 DIAGNOSIS — N186 End stage renal disease: Secondary | ICD-10-CM | POA: Diagnosis not present

## 2024-05-09 DIAGNOSIS — Z992 Dependence on renal dialysis: Secondary | ICD-10-CM | POA: Diagnosis not present

## 2024-05-11 DIAGNOSIS — D509 Iron deficiency anemia, unspecified: Secondary | ICD-10-CM | POA: Diagnosis not present

## 2024-05-11 DIAGNOSIS — Z992 Dependence on renal dialysis: Secondary | ICD-10-CM | POA: Diagnosis not present

## 2024-05-11 DIAGNOSIS — N186 End stage renal disease: Secondary | ICD-10-CM | POA: Diagnosis not present

## 2024-05-11 DIAGNOSIS — N250 Renal osteodystrophy: Secondary | ICD-10-CM | POA: Diagnosis not present

## 2024-05-13 DIAGNOSIS — N186 End stage renal disease: Secondary | ICD-10-CM | POA: Diagnosis not present

## 2024-05-13 DIAGNOSIS — Z992 Dependence on renal dialysis: Secondary | ICD-10-CM | POA: Diagnosis not present

## 2024-05-14 DIAGNOSIS — N186 End stage renal disease: Secondary | ICD-10-CM | POA: Diagnosis not present

## 2024-05-14 DIAGNOSIS — N250 Renal osteodystrophy: Secondary | ICD-10-CM | POA: Diagnosis not present

## 2024-05-14 DIAGNOSIS — Z992 Dependence on renal dialysis: Secondary | ICD-10-CM | POA: Diagnosis not present

## 2024-05-14 DIAGNOSIS — D509 Iron deficiency anemia, unspecified: Secondary | ICD-10-CM | POA: Diagnosis not present

## 2024-05-16 DIAGNOSIS — Z992 Dependence on renal dialysis: Secondary | ICD-10-CM | POA: Diagnosis not present

## 2024-05-16 DIAGNOSIS — D509 Iron deficiency anemia, unspecified: Secondary | ICD-10-CM | POA: Diagnosis not present

## 2024-05-16 DIAGNOSIS — N186 End stage renal disease: Secondary | ICD-10-CM | POA: Diagnosis not present

## 2024-05-16 DIAGNOSIS — N250 Renal osteodystrophy: Secondary | ICD-10-CM | POA: Diagnosis not present

## 2024-05-18 DIAGNOSIS — Z992 Dependence on renal dialysis: Secondary | ICD-10-CM | POA: Diagnosis not present

## 2024-05-18 DIAGNOSIS — D509 Iron deficiency anemia, unspecified: Secondary | ICD-10-CM | POA: Diagnosis not present

## 2024-05-18 DIAGNOSIS — N186 End stage renal disease: Secondary | ICD-10-CM | POA: Diagnosis not present

## 2024-05-18 DIAGNOSIS — N250 Renal osteodystrophy: Secondary | ICD-10-CM | POA: Diagnosis not present

## 2024-05-21 DIAGNOSIS — N186 End stage renal disease: Secondary | ICD-10-CM | POA: Diagnosis not present

## 2024-05-21 DIAGNOSIS — N250 Renal osteodystrophy: Secondary | ICD-10-CM | POA: Diagnosis not present

## 2024-05-21 DIAGNOSIS — D509 Iron deficiency anemia, unspecified: Secondary | ICD-10-CM | POA: Diagnosis not present

## 2024-05-21 DIAGNOSIS — Z992 Dependence on renal dialysis: Secondary | ICD-10-CM | POA: Diagnosis not present

## 2024-05-23 DIAGNOSIS — N186 End stage renal disease: Secondary | ICD-10-CM | POA: Diagnosis not present

## 2024-05-23 DIAGNOSIS — N250 Renal osteodystrophy: Secondary | ICD-10-CM | POA: Diagnosis not present

## 2024-05-23 DIAGNOSIS — Z992 Dependence on renal dialysis: Secondary | ICD-10-CM | POA: Diagnosis not present

## 2024-05-23 DIAGNOSIS — D509 Iron deficiency anemia, unspecified: Secondary | ICD-10-CM | POA: Diagnosis not present

## 2024-05-25 DIAGNOSIS — N186 End stage renal disease: Secondary | ICD-10-CM | POA: Diagnosis not present

## 2024-05-25 DIAGNOSIS — N250 Renal osteodystrophy: Secondary | ICD-10-CM | POA: Diagnosis not present

## 2024-05-25 DIAGNOSIS — Z992 Dependence on renal dialysis: Secondary | ICD-10-CM | POA: Diagnosis not present

## 2024-05-25 DIAGNOSIS — D509 Iron deficiency anemia, unspecified: Secondary | ICD-10-CM | POA: Diagnosis not present

## 2024-05-28 DIAGNOSIS — N186 End stage renal disease: Secondary | ICD-10-CM | POA: Diagnosis not present

## 2024-05-28 DIAGNOSIS — Z992 Dependence on renal dialysis: Secondary | ICD-10-CM | POA: Diagnosis not present

## 2024-05-28 DIAGNOSIS — D509 Iron deficiency anemia, unspecified: Secondary | ICD-10-CM | POA: Diagnosis not present

## 2024-05-28 DIAGNOSIS — N250 Renal osteodystrophy: Secondary | ICD-10-CM | POA: Diagnosis not present

## 2024-05-30 DIAGNOSIS — Z992 Dependence on renal dialysis: Secondary | ICD-10-CM | POA: Diagnosis not present

## 2024-05-30 DIAGNOSIS — N186 End stage renal disease: Secondary | ICD-10-CM | POA: Diagnosis not present

## 2024-05-30 DIAGNOSIS — N250 Renal osteodystrophy: Secondary | ICD-10-CM | POA: Diagnosis not present

## 2024-05-30 DIAGNOSIS — D509 Iron deficiency anemia, unspecified: Secondary | ICD-10-CM | POA: Diagnosis not present

## 2024-06-01 DIAGNOSIS — N250 Renal osteodystrophy: Secondary | ICD-10-CM | POA: Diagnosis not present

## 2024-06-01 DIAGNOSIS — N186 End stage renal disease: Secondary | ICD-10-CM | POA: Diagnosis not present

## 2024-06-01 DIAGNOSIS — D509 Iron deficiency anemia, unspecified: Secondary | ICD-10-CM | POA: Diagnosis not present

## 2024-06-01 DIAGNOSIS — Z992 Dependence on renal dialysis: Secondary | ICD-10-CM | POA: Diagnosis not present

## 2024-06-04 DIAGNOSIS — D509 Iron deficiency anemia, unspecified: Secondary | ICD-10-CM | POA: Diagnosis not present

## 2024-06-04 DIAGNOSIS — Z992 Dependence on renal dialysis: Secondary | ICD-10-CM | POA: Diagnosis not present

## 2024-06-04 DIAGNOSIS — N250 Renal osteodystrophy: Secondary | ICD-10-CM | POA: Diagnosis not present

## 2024-06-04 DIAGNOSIS — E119 Type 2 diabetes mellitus without complications: Secondary | ICD-10-CM | POA: Diagnosis not present

## 2024-06-04 DIAGNOSIS — N186 End stage renal disease: Secondary | ICD-10-CM | POA: Diagnosis not present

## 2024-06-06 DIAGNOSIS — N250 Renal osteodystrophy: Secondary | ICD-10-CM | POA: Diagnosis not present

## 2024-06-06 DIAGNOSIS — D509 Iron deficiency anemia, unspecified: Secondary | ICD-10-CM | POA: Diagnosis not present

## 2024-06-06 DIAGNOSIS — N186 End stage renal disease: Secondary | ICD-10-CM | POA: Diagnosis not present

## 2024-06-06 DIAGNOSIS — Z992 Dependence on renal dialysis: Secondary | ICD-10-CM | POA: Diagnosis not present

## 2024-06-08 DIAGNOSIS — N186 End stage renal disease: Secondary | ICD-10-CM | POA: Diagnosis not present

## 2024-06-08 DIAGNOSIS — D509 Iron deficiency anemia, unspecified: Secondary | ICD-10-CM | POA: Diagnosis not present

## 2024-06-08 DIAGNOSIS — N250 Renal osteodystrophy: Secondary | ICD-10-CM | POA: Diagnosis not present

## 2024-06-08 DIAGNOSIS — Z992 Dependence on renal dialysis: Secondary | ICD-10-CM | POA: Diagnosis not present

## 2024-06-11 DIAGNOSIS — Z992 Dependence on renal dialysis: Secondary | ICD-10-CM | POA: Diagnosis not present

## 2024-06-11 DIAGNOSIS — N186 End stage renal disease: Secondary | ICD-10-CM | POA: Diagnosis not present

## 2024-06-11 DIAGNOSIS — D509 Iron deficiency anemia, unspecified: Secondary | ICD-10-CM | POA: Diagnosis not present

## 2024-06-11 DIAGNOSIS — N250 Renal osteodystrophy: Secondary | ICD-10-CM | POA: Diagnosis not present

## 2024-06-12 DIAGNOSIS — N186 End stage renal disease: Secondary | ICD-10-CM | POA: Diagnosis not present

## 2024-06-12 DIAGNOSIS — Z992 Dependence on renal dialysis: Secondary | ICD-10-CM | POA: Diagnosis not present

## 2024-06-13 DIAGNOSIS — N250 Renal osteodystrophy: Secondary | ICD-10-CM | POA: Diagnosis not present

## 2024-06-13 DIAGNOSIS — D509 Iron deficiency anemia, unspecified: Secondary | ICD-10-CM | POA: Diagnosis not present

## 2024-06-13 DIAGNOSIS — Z992 Dependence on renal dialysis: Secondary | ICD-10-CM | POA: Diagnosis not present

## 2024-06-13 DIAGNOSIS — Z23 Encounter for immunization: Secondary | ICD-10-CM | POA: Diagnosis not present

## 2024-06-13 DIAGNOSIS — N186 End stage renal disease: Secondary | ICD-10-CM | POA: Diagnosis not present

## 2024-06-15 DIAGNOSIS — Z23 Encounter for immunization: Secondary | ICD-10-CM | POA: Diagnosis not present

## 2024-06-15 DIAGNOSIS — N250 Renal osteodystrophy: Secondary | ICD-10-CM | POA: Diagnosis not present

## 2024-06-15 DIAGNOSIS — Z992 Dependence on renal dialysis: Secondary | ICD-10-CM | POA: Diagnosis not present

## 2024-06-15 DIAGNOSIS — N186 End stage renal disease: Secondary | ICD-10-CM | POA: Diagnosis not present

## 2024-06-15 DIAGNOSIS — D509 Iron deficiency anemia, unspecified: Secondary | ICD-10-CM | POA: Diagnosis not present

## 2024-06-18 DIAGNOSIS — D509 Iron deficiency anemia, unspecified: Secondary | ICD-10-CM | POA: Diagnosis not present

## 2024-06-18 DIAGNOSIS — Z992 Dependence on renal dialysis: Secondary | ICD-10-CM | POA: Diagnosis not present

## 2024-06-18 DIAGNOSIS — N250 Renal osteodystrophy: Secondary | ICD-10-CM | POA: Diagnosis not present

## 2024-06-18 DIAGNOSIS — N186 End stage renal disease: Secondary | ICD-10-CM | POA: Diagnosis not present

## 2024-06-18 DIAGNOSIS — Z23 Encounter for immunization: Secondary | ICD-10-CM | POA: Diagnosis not present

## 2024-06-20 DIAGNOSIS — Z23 Encounter for immunization: Secondary | ICD-10-CM | POA: Diagnosis not present

## 2024-06-20 DIAGNOSIS — D509 Iron deficiency anemia, unspecified: Secondary | ICD-10-CM | POA: Diagnosis not present

## 2024-06-20 DIAGNOSIS — N186 End stage renal disease: Secondary | ICD-10-CM | POA: Diagnosis not present

## 2024-06-20 DIAGNOSIS — N250 Renal osteodystrophy: Secondary | ICD-10-CM | POA: Diagnosis not present

## 2024-06-20 DIAGNOSIS — Z992 Dependence on renal dialysis: Secondary | ICD-10-CM | POA: Diagnosis not present

## 2024-06-22 DIAGNOSIS — N186 End stage renal disease: Secondary | ICD-10-CM | POA: Diagnosis not present

## 2024-06-22 DIAGNOSIS — Z992 Dependence on renal dialysis: Secondary | ICD-10-CM | POA: Diagnosis not present

## 2024-06-22 DIAGNOSIS — Z23 Encounter for immunization: Secondary | ICD-10-CM | POA: Diagnosis not present

## 2024-06-22 DIAGNOSIS — D509 Iron deficiency anemia, unspecified: Secondary | ICD-10-CM | POA: Diagnosis not present

## 2024-06-22 DIAGNOSIS — N250 Renal osteodystrophy: Secondary | ICD-10-CM | POA: Diagnosis not present

## 2024-06-25 DIAGNOSIS — N186 End stage renal disease: Secondary | ICD-10-CM | POA: Diagnosis not present

## 2024-06-25 DIAGNOSIS — D509 Iron deficiency anemia, unspecified: Secondary | ICD-10-CM | POA: Diagnosis not present

## 2024-06-25 DIAGNOSIS — N250 Renal osteodystrophy: Secondary | ICD-10-CM | POA: Diagnosis not present

## 2024-06-25 DIAGNOSIS — Z992 Dependence on renal dialysis: Secondary | ICD-10-CM | POA: Diagnosis not present

## 2024-06-25 DIAGNOSIS — Z23 Encounter for immunization: Secondary | ICD-10-CM | POA: Diagnosis not present

## 2024-06-27 DIAGNOSIS — D509 Iron deficiency anemia, unspecified: Secondary | ICD-10-CM | POA: Diagnosis not present

## 2024-06-27 DIAGNOSIS — Z992 Dependence on renal dialysis: Secondary | ICD-10-CM | POA: Diagnosis not present

## 2024-06-27 DIAGNOSIS — Z23 Encounter for immunization: Secondary | ICD-10-CM | POA: Diagnosis not present

## 2024-06-27 DIAGNOSIS — N250 Renal osteodystrophy: Secondary | ICD-10-CM | POA: Diagnosis not present

## 2024-06-27 DIAGNOSIS — N186 End stage renal disease: Secondary | ICD-10-CM | POA: Diagnosis not present

## 2024-06-29 DIAGNOSIS — Z23 Encounter for immunization: Secondary | ICD-10-CM | POA: Diagnosis not present

## 2024-06-29 DIAGNOSIS — N186 End stage renal disease: Secondary | ICD-10-CM | POA: Diagnosis not present

## 2024-06-29 DIAGNOSIS — D509 Iron deficiency anemia, unspecified: Secondary | ICD-10-CM | POA: Diagnosis not present

## 2024-06-29 DIAGNOSIS — Z992 Dependence on renal dialysis: Secondary | ICD-10-CM | POA: Diagnosis not present

## 2024-06-29 DIAGNOSIS — N250 Renal osteodystrophy: Secondary | ICD-10-CM | POA: Diagnosis not present

## 2024-07-02 DIAGNOSIS — H6122 Impacted cerumen, left ear: Secondary | ICD-10-CM | POA: Diagnosis not present

## 2024-07-02 DIAGNOSIS — N186 End stage renal disease: Secondary | ICD-10-CM | POA: Diagnosis not present

## 2024-07-02 DIAGNOSIS — Z992 Dependence on renal dialysis: Secondary | ICD-10-CM | POA: Diagnosis not present

## 2024-07-02 DIAGNOSIS — Z23 Encounter for immunization: Secondary | ICD-10-CM | POA: Diagnosis not present

## 2024-07-02 DIAGNOSIS — D509 Iron deficiency anemia, unspecified: Secondary | ICD-10-CM | POA: Diagnosis not present

## 2024-07-02 DIAGNOSIS — N250 Renal osteodystrophy: Secondary | ICD-10-CM | POA: Diagnosis not present

## 2024-07-04 DIAGNOSIS — N250 Renal osteodystrophy: Secondary | ICD-10-CM | POA: Diagnosis not present

## 2024-07-04 DIAGNOSIS — D509 Iron deficiency anemia, unspecified: Secondary | ICD-10-CM | POA: Diagnosis not present

## 2024-07-04 DIAGNOSIS — Z992 Dependence on renal dialysis: Secondary | ICD-10-CM | POA: Diagnosis not present

## 2024-07-04 DIAGNOSIS — N186 End stage renal disease: Secondary | ICD-10-CM | POA: Diagnosis not present

## 2024-07-04 DIAGNOSIS — Z23 Encounter for immunization: Secondary | ICD-10-CM | POA: Diagnosis not present

## 2024-07-06 DIAGNOSIS — Z23 Encounter for immunization: Secondary | ICD-10-CM | POA: Diagnosis not present

## 2024-07-06 DIAGNOSIS — N250 Renal osteodystrophy: Secondary | ICD-10-CM | POA: Diagnosis not present

## 2024-07-06 DIAGNOSIS — Z992 Dependence on renal dialysis: Secondary | ICD-10-CM | POA: Diagnosis not present

## 2024-07-06 DIAGNOSIS — D509 Iron deficiency anemia, unspecified: Secondary | ICD-10-CM | POA: Diagnosis not present

## 2024-07-06 DIAGNOSIS — N186 End stage renal disease: Secondary | ICD-10-CM | POA: Diagnosis not present

## 2024-07-09 DIAGNOSIS — Z992 Dependence on renal dialysis: Secondary | ICD-10-CM | POA: Diagnosis not present

## 2024-07-09 DIAGNOSIS — D509 Iron deficiency anemia, unspecified: Secondary | ICD-10-CM | POA: Diagnosis not present

## 2024-07-09 DIAGNOSIS — N250 Renal osteodystrophy: Secondary | ICD-10-CM | POA: Diagnosis not present

## 2024-07-09 DIAGNOSIS — Z23 Encounter for immunization: Secondary | ICD-10-CM | POA: Diagnosis not present

## 2024-07-09 DIAGNOSIS — N186 End stage renal disease: Secondary | ICD-10-CM | POA: Diagnosis not present

## 2024-07-11 DIAGNOSIS — Z6822 Body mass index (BMI) 22.0-22.9, adult: Secondary | ICD-10-CM | POA: Diagnosis not present

## 2024-07-11 DIAGNOSIS — F17210 Nicotine dependence, cigarettes, uncomplicated: Secondary | ICD-10-CM | POA: Diagnosis not present

## 2024-07-11 DIAGNOSIS — G959 Disease of spinal cord, unspecified: Secondary | ICD-10-CM | POA: Diagnosis not present

## 2024-07-11 DIAGNOSIS — I10 Essential (primary) hypertension: Secondary | ICD-10-CM | POA: Diagnosis not present

## 2024-07-11 DIAGNOSIS — D509 Iron deficiency anemia, unspecified: Secondary | ICD-10-CM | POA: Diagnosis not present

## 2024-07-11 DIAGNOSIS — Z992 Dependence on renal dialysis: Secondary | ICD-10-CM | POA: Diagnosis not present

## 2024-07-11 DIAGNOSIS — G629 Polyneuropathy, unspecified: Secondary | ICD-10-CM | POA: Diagnosis not present

## 2024-07-11 DIAGNOSIS — N250 Renal osteodystrophy: Secondary | ICD-10-CM | POA: Diagnosis not present

## 2024-07-11 DIAGNOSIS — N186 End stage renal disease: Secondary | ICD-10-CM | POA: Diagnosis not present

## 2024-07-11 DIAGNOSIS — M25112 Fistula, left shoulder: Secondary | ICD-10-CM | POA: Diagnosis not present

## 2024-07-11 DIAGNOSIS — Z23 Encounter for immunization: Secondary | ICD-10-CM | POA: Diagnosis not present

## 2024-07-11 DIAGNOSIS — E1142 Type 2 diabetes mellitus with diabetic polyneuropathy: Secondary | ICD-10-CM | POA: Diagnosis not present

## 2024-07-11 DIAGNOSIS — I739 Peripheral vascular disease, unspecified: Secondary | ICD-10-CM | POA: Diagnosis not present

## 2024-07-13 DIAGNOSIS — N250 Renal osteodystrophy: Secondary | ICD-10-CM | POA: Diagnosis not present

## 2024-07-13 DIAGNOSIS — D509 Iron deficiency anemia, unspecified: Secondary | ICD-10-CM | POA: Diagnosis not present

## 2024-07-13 DIAGNOSIS — N186 End stage renal disease: Secondary | ICD-10-CM | POA: Diagnosis not present

## 2024-07-13 DIAGNOSIS — Z992 Dependence on renal dialysis: Secondary | ICD-10-CM | POA: Diagnosis not present

## 2024-07-16 DIAGNOSIS — N250 Renal osteodystrophy: Secondary | ICD-10-CM | POA: Diagnosis not present

## 2024-07-16 DIAGNOSIS — Z992 Dependence on renal dialysis: Secondary | ICD-10-CM | POA: Diagnosis not present

## 2024-07-16 DIAGNOSIS — N186 End stage renal disease: Secondary | ICD-10-CM | POA: Diagnosis not present

## 2024-07-16 DIAGNOSIS — D509 Iron deficiency anemia, unspecified: Secondary | ICD-10-CM | POA: Diagnosis not present

## 2024-07-18 DIAGNOSIS — N186 End stage renal disease: Secondary | ICD-10-CM | POA: Diagnosis not present

## 2024-07-18 DIAGNOSIS — Z992 Dependence on renal dialysis: Secondary | ICD-10-CM | POA: Diagnosis not present

## 2024-07-18 DIAGNOSIS — D509 Iron deficiency anemia, unspecified: Secondary | ICD-10-CM | POA: Diagnosis not present

## 2024-07-18 DIAGNOSIS — N250 Renal osteodystrophy: Secondary | ICD-10-CM | POA: Diagnosis not present

## 2024-07-20 DIAGNOSIS — Z992 Dependence on renal dialysis: Secondary | ICD-10-CM | POA: Diagnosis not present

## 2024-07-20 DIAGNOSIS — D509 Iron deficiency anemia, unspecified: Secondary | ICD-10-CM | POA: Diagnosis not present

## 2024-07-20 DIAGNOSIS — N186 End stage renal disease: Secondary | ICD-10-CM | POA: Diagnosis not present

## 2024-07-20 DIAGNOSIS — N250 Renal osteodystrophy: Secondary | ICD-10-CM | POA: Diagnosis not present

## 2024-07-23 DIAGNOSIS — N250 Renal osteodystrophy: Secondary | ICD-10-CM | POA: Diagnosis not present

## 2024-07-23 DIAGNOSIS — N186 End stage renal disease: Secondary | ICD-10-CM | POA: Diagnosis not present

## 2024-07-23 DIAGNOSIS — Z992 Dependence on renal dialysis: Secondary | ICD-10-CM | POA: Diagnosis not present

## 2024-07-23 DIAGNOSIS — D509 Iron deficiency anemia, unspecified: Secondary | ICD-10-CM | POA: Diagnosis not present

## 2024-07-25 DIAGNOSIS — D509 Iron deficiency anemia, unspecified: Secondary | ICD-10-CM | POA: Diagnosis not present

## 2024-07-25 DIAGNOSIS — N186 End stage renal disease: Secondary | ICD-10-CM | POA: Diagnosis not present

## 2024-07-25 DIAGNOSIS — Z992 Dependence on renal dialysis: Secondary | ICD-10-CM | POA: Diagnosis not present

## 2024-07-25 DIAGNOSIS — N250 Renal osteodystrophy: Secondary | ICD-10-CM | POA: Diagnosis not present

## 2024-07-26 DIAGNOSIS — N186 End stage renal disease: Secondary | ICD-10-CM | POA: Diagnosis not present

## 2024-07-26 DIAGNOSIS — I120 Hypertensive chronic kidney disease with stage 5 chronic kidney disease or end stage renal disease: Secondary | ICD-10-CM | POA: Diagnosis not present

## 2024-07-26 DIAGNOSIS — Z87891 Personal history of nicotine dependence: Secondary | ICD-10-CM | POA: Diagnosis not present

## 2024-07-26 DIAGNOSIS — F17210 Nicotine dependence, cigarettes, uncomplicated: Secondary | ICD-10-CM | POA: Diagnosis not present

## 2024-07-26 DIAGNOSIS — E1122 Type 2 diabetes mellitus with diabetic chronic kidney disease: Secondary | ICD-10-CM | POA: Diagnosis not present

## 2024-07-26 DIAGNOSIS — Z122 Encounter for screening for malignant neoplasm of respiratory organs: Secondary | ICD-10-CM | POA: Diagnosis not present

## 2024-07-27 DIAGNOSIS — N250 Renal osteodystrophy: Secondary | ICD-10-CM | POA: Diagnosis not present

## 2024-07-27 DIAGNOSIS — N186 End stage renal disease: Secondary | ICD-10-CM | POA: Diagnosis not present

## 2024-07-27 DIAGNOSIS — Z992 Dependence on renal dialysis: Secondary | ICD-10-CM | POA: Diagnosis not present

## 2024-07-27 DIAGNOSIS — D509 Iron deficiency anemia, unspecified: Secondary | ICD-10-CM | POA: Diagnosis not present

## 2024-07-30 DIAGNOSIS — D509 Iron deficiency anemia, unspecified: Secondary | ICD-10-CM | POA: Diagnosis not present

## 2024-07-30 DIAGNOSIS — Z992 Dependence on renal dialysis: Secondary | ICD-10-CM | POA: Diagnosis not present

## 2024-07-30 DIAGNOSIS — N186 End stage renal disease: Secondary | ICD-10-CM | POA: Diagnosis not present

## 2024-07-30 DIAGNOSIS — N250 Renal osteodystrophy: Secondary | ICD-10-CM | POA: Diagnosis not present

## 2024-08-01 DIAGNOSIS — N186 End stage renal disease: Secondary | ICD-10-CM | POA: Diagnosis not present

## 2024-08-01 DIAGNOSIS — N250 Renal osteodystrophy: Secondary | ICD-10-CM | POA: Diagnosis not present

## 2024-08-01 DIAGNOSIS — Z992 Dependence on renal dialysis: Secondary | ICD-10-CM | POA: Diagnosis not present

## 2024-08-01 DIAGNOSIS — D509 Iron deficiency anemia, unspecified: Secondary | ICD-10-CM | POA: Diagnosis not present

## 2024-08-03 DIAGNOSIS — Z992 Dependence on renal dialysis: Secondary | ICD-10-CM | POA: Diagnosis not present

## 2024-08-03 DIAGNOSIS — D509 Iron deficiency anemia, unspecified: Secondary | ICD-10-CM | POA: Diagnosis not present

## 2024-08-03 DIAGNOSIS — N186 End stage renal disease: Secondary | ICD-10-CM | POA: Diagnosis not present

## 2024-08-03 DIAGNOSIS — N250 Renal osteodystrophy: Secondary | ICD-10-CM | POA: Diagnosis not present

## 2024-08-06 DIAGNOSIS — N250 Renal osteodystrophy: Secondary | ICD-10-CM | POA: Diagnosis not present

## 2024-08-06 DIAGNOSIS — N186 End stage renal disease: Secondary | ICD-10-CM | POA: Diagnosis not present

## 2024-08-06 DIAGNOSIS — Z992 Dependence on renal dialysis: Secondary | ICD-10-CM | POA: Diagnosis not present

## 2024-08-06 DIAGNOSIS — D509 Iron deficiency anemia, unspecified: Secondary | ICD-10-CM | POA: Diagnosis not present

## 2024-08-08 DIAGNOSIS — Z992 Dependence on renal dialysis: Secondary | ICD-10-CM | POA: Diagnosis not present

## 2024-08-08 DIAGNOSIS — D509 Iron deficiency anemia, unspecified: Secondary | ICD-10-CM | POA: Diagnosis not present

## 2024-08-08 DIAGNOSIS — N250 Renal osteodystrophy: Secondary | ICD-10-CM | POA: Diagnosis not present

## 2024-08-08 DIAGNOSIS — N186 End stage renal disease: Secondary | ICD-10-CM | POA: Diagnosis not present

## 2024-08-10 DIAGNOSIS — N250 Renal osteodystrophy: Secondary | ICD-10-CM | POA: Diagnosis not present

## 2024-08-10 DIAGNOSIS — N186 End stage renal disease: Secondary | ICD-10-CM | POA: Diagnosis not present

## 2024-08-10 DIAGNOSIS — Z992 Dependence on renal dialysis: Secondary | ICD-10-CM | POA: Diagnosis not present

## 2024-08-10 DIAGNOSIS — D509 Iron deficiency anemia, unspecified: Secondary | ICD-10-CM | POA: Diagnosis not present
